# Patient Record
Sex: Female | Born: 1935 | Race: White | Hispanic: No | Smoking: Former smoker
Health system: Southern US, Community
[De-identification: ages and names within clinical notes are randomized; demographics above are authoritative.]

## PROBLEM LIST (undated history)

## (undated) DIAGNOSIS — K5790 Diverticulosis of intestine, part unspecified, without perforation or abscess without bleeding: Secondary | ICD-10-CM

## (undated) DIAGNOSIS — K5900 Constipation, unspecified: Secondary | ICD-10-CM

## (undated) DIAGNOSIS — N951 Menopausal and female climacteric states: Secondary | ICD-10-CM

## (undated) DIAGNOSIS — E559 Vitamin D deficiency, unspecified: Secondary | ICD-10-CM

## (undated) DIAGNOSIS — K639 Disease of intestine, unspecified: Secondary | ICD-10-CM

## (undated) DIAGNOSIS — E7800 Pure hypercholesterolemia, unspecified: Secondary | ICD-10-CM

## (undated) DIAGNOSIS — D250 Submucous leiomyoma of uterus: Secondary | ICD-10-CM

## (undated) DIAGNOSIS — M21619 Bunion of unspecified foot: Secondary | ICD-10-CM

## (undated) DIAGNOSIS — I8390 Asymptomatic varicose veins of unspecified lower extremity: Secondary | ICD-10-CM

## (undated) DIAGNOSIS — C801 Malignant (primary) neoplasm, unspecified: Secondary | ICD-10-CM

## (undated) DIAGNOSIS — I639 Cerebral infarction, unspecified: Secondary | ICD-10-CM

## (undated) DIAGNOSIS — K219 Gastro-esophageal reflux disease without esophagitis: Secondary | ICD-10-CM

## (undated) DIAGNOSIS — M1990 Unspecified osteoarthritis, unspecified site: Secondary | ICD-10-CM

## (undated) DIAGNOSIS — R748 Abnormal levels of other serum enzymes: Secondary | ICD-10-CM

## (undated) DIAGNOSIS — N952 Postmenopausal atrophic vaginitis: Secondary | ICD-10-CM

## (undated) HISTORY — PX: UPPER GI ENDOSCOPY: SHX6162

## (undated) HISTORY — DX: Bunion of unspecified foot: M21.619

## (undated) HISTORY — DX: Postmenopausal atrophic vaginitis: N95.2

## (undated) HISTORY — DX: Gastro-esophageal reflux disease without esophagitis: K21.9

## (undated) HISTORY — DX: Disease of intestine, unspecified: K63.9

## (undated) HISTORY — DX: Vitamin D deficiency, unspecified: E55.9

## (undated) HISTORY — PX: BUNIONECTOMY: SHX129

## (undated) HISTORY — DX: Pure hypercholesterolemia, unspecified: E78.00

## (undated) HISTORY — DX: Submucous leiomyoma of uterus: D25.0

## (undated) HISTORY — DX: Menopausal and female climacteric states: N95.1

## (undated) HISTORY — DX: Asymptomatic varicose veins of unspecified lower extremity: I83.90

## (undated) HISTORY — PX: CATARACT EXTRACTION: SUR2

## (undated) HISTORY — DX: Cerebral infarction, unspecified: I63.9

## (undated) HISTORY — DX: Diverticulosis of intestine, part unspecified, without perforation or abscess without bleeding: K57.90

## (undated) HISTORY — PX: WRIST FRACTURE SURGERY: SHX121

## (undated) HISTORY — PX: COLONOSCOPY W/ POLYPECTOMY: SHX1380

## (undated) HISTORY — DX: Abnormal levels of other serum enzymes: R74.8

## (undated) HISTORY — DX: Constipation, unspecified: K59.00

---

## 2001-01-04 ENCOUNTER — Other Ambulatory Visit: Admission: RE | Admit: 2001-01-04 | Discharge: 2001-01-04 | Payer: Self-pay | Admitting: Family Medicine

## 2004-04-02 ENCOUNTER — Ambulatory Visit: Payer: Self-pay | Admitting: Family Medicine

## 2004-05-05 ENCOUNTER — Ambulatory Visit: Payer: Self-pay | Admitting: Podiatry

## 2005-02-26 ENCOUNTER — Ambulatory Visit: Payer: Self-pay | Admitting: Podiatry

## 2005-04-05 ENCOUNTER — Ambulatory Visit: Payer: Self-pay | Admitting: Family Medicine

## 2005-04-19 ENCOUNTER — Ambulatory Visit: Payer: Self-pay | Admitting: Ophthalmology

## 2005-06-21 ENCOUNTER — Ambulatory Visit: Payer: Self-pay | Admitting: Ophthalmology

## 2006-04-11 ENCOUNTER — Ambulatory Visit: Payer: Self-pay | Admitting: Family Medicine

## 2006-05-31 ENCOUNTER — Emergency Department: Payer: Self-pay | Admitting: Emergency Medicine

## 2006-06-01 ENCOUNTER — Ambulatory Visit: Payer: Self-pay | Admitting: Specialist

## 2006-06-03 ENCOUNTER — Ambulatory Visit: Payer: Self-pay | Admitting: Specialist

## 2006-08-23 ENCOUNTER — Encounter: Payer: Self-pay | Admitting: Specialist

## 2006-09-02 ENCOUNTER — Encounter: Payer: Self-pay | Admitting: Specialist

## 2006-10-03 ENCOUNTER — Encounter: Payer: Self-pay | Admitting: Specialist

## 2006-11-02 ENCOUNTER — Encounter: Payer: Self-pay | Admitting: Specialist

## 2006-12-03 ENCOUNTER — Encounter: Payer: Self-pay | Admitting: Specialist

## 2007-01-02 ENCOUNTER — Encounter: Payer: Self-pay | Admitting: Specialist

## 2007-02-28 ENCOUNTER — Ambulatory Visit: Payer: Self-pay | Admitting: General Surgery

## 2007-03-15 ENCOUNTER — Ambulatory Visit: Payer: Self-pay | Admitting: Specialist

## 2007-04-12 ENCOUNTER — Ambulatory Visit: Payer: Self-pay | Admitting: Family Medicine

## 2008-02-16 DIAGNOSIS — K5790 Diverticulosis of intestine, part unspecified, without perforation or abscess without bleeding: Secondary | ICD-10-CM | POA: Insufficient documentation

## 2008-02-16 DIAGNOSIS — K639 Disease of intestine, unspecified: Secondary | ICD-10-CM

## 2008-02-16 HISTORY — DX: Disease of intestine, unspecified: K63.9

## 2008-02-16 HISTORY — DX: Diverticulosis of intestine, part unspecified, without perforation or abscess without bleeding: K57.90

## 2008-02-26 DIAGNOSIS — K219 Gastro-esophageal reflux disease without esophagitis: Secondary | ICD-10-CM | POA: Insufficient documentation

## 2008-02-26 HISTORY — DX: Gastro-esophageal reflux disease without esophagitis: K21.9

## 2008-04-11 ENCOUNTER — Ambulatory Visit: Payer: Self-pay | Admitting: Gastroenterology

## 2008-04-15 ENCOUNTER — Ambulatory Visit: Payer: Self-pay | Admitting: Family Medicine

## 2009-02-01 HISTORY — PX: HYSTEROSCOPY: SHX211

## 2009-04-16 ENCOUNTER — Ambulatory Visit: Payer: Self-pay | Admitting: Family Medicine

## 2009-04-21 ENCOUNTER — Ambulatory Visit: Payer: Self-pay | Admitting: Family Medicine

## 2009-07-14 ENCOUNTER — Ambulatory Visit: Payer: Self-pay | Admitting: Unknown Physician Specialty

## 2009-07-29 ENCOUNTER — Ambulatory Visit: Payer: Self-pay | Admitting: Unknown Physician Specialty

## 2009-10-21 ENCOUNTER — Ambulatory Visit: Payer: Self-pay | Admitting: Rheumatology

## 2010-05-06 ENCOUNTER — Ambulatory Visit: Payer: Self-pay | Admitting: Family Medicine

## 2011-05-26 ENCOUNTER — Ambulatory Visit: Payer: Self-pay | Admitting: Family Medicine

## 2011-11-10 ENCOUNTER — Ambulatory Visit: Payer: Self-pay | Admitting: Rheumatology

## 2012-05-31 ENCOUNTER — Ambulatory Visit: Payer: Self-pay | Admitting: Family Medicine

## 2012-09-27 ENCOUNTER — Ambulatory Visit: Payer: Self-pay | Admitting: Gastroenterology

## 2012-09-28 LAB — PATHOLOGY REPORT

## 2013-07-17 ENCOUNTER — Ambulatory Visit: Payer: Self-pay | Admitting: Family Medicine

## 2013-08-02 ENCOUNTER — Ambulatory Visit: Payer: Self-pay | Admitting: Gastroenterology

## 2013-08-27 ENCOUNTER — Ambulatory Visit: Payer: Self-pay | Admitting: Family Medicine

## 2014-07-02 DIAGNOSIS — Z79899 Other long term (current) drug therapy: Secondary | ICD-10-CM | POA: Diagnosis not present

## 2014-07-02 DIAGNOSIS — F1099 Alcohol use, unspecified with unspecified alcohol-induced disorder: Secondary | ICD-10-CM | POA: Diagnosis not present

## 2014-07-02 DIAGNOSIS — K219 Gastro-esophageal reflux disease without esophagitis: Secondary | ICD-10-CM | POA: Diagnosis not present

## 2014-07-02 DIAGNOSIS — Z124 Encounter for screening for malignant neoplasm of cervix: Secondary | ICD-10-CM | POA: Diagnosis not present

## 2014-07-02 DIAGNOSIS — E780 Pure hypercholesterolemia: Secondary | ICD-10-CM | POA: Diagnosis not present

## 2014-07-02 DIAGNOSIS — M810 Age-related osteoporosis without current pathological fracture: Secondary | ICD-10-CM | POA: Diagnosis not present

## 2014-07-03 ENCOUNTER — Other Ambulatory Visit: Payer: Self-pay | Admitting: Family Medicine

## 2014-07-03 DIAGNOSIS — Z1231 Encounter for screening mammogram for malignant neoplasm of breast: Secondary | ICD-10-CM

## 2014-07-05 ENCOUNTER — Telehealth: Payer: Self-pay | Admitting: Family Medicine

## 2014-07-05 NOTE — Telephone Encounter (Signed)
Per note from [employer]: PAP normal.  Please notify patient of results. Thanks- Dr. Jerilynn Mages. Informed pt. Renaldo Fiddler, CMA

## 2014-07-05 NOTE — Telephone Encounter (Signed)
Pt is called to get lab results.  HM#[phone]/MO

## 2014-08-06 ENCOUNTER — Encounter: Payer: Self-pay | Admitting: Family Medicine

## 2014-08-06 DIAGNOSIS — Z789 Other specified health status: Secondary | ICD-10-CM | POA: Insufficient documentation

## 2014-08-06 DIAGNOSIS — Z7289 Other problems related to lifestyle: Secondary | ICD-10-CM | POA: Insufficient documentation

## 2014-08-06 DIAGNOSIS — Z79899 Other long term (current) drug therapy: Secondary | ICD-10-CM | POA: Insufficient documentation

## 2014-08-12 ENCOUNTER — Encounter: Payer: Self-pay | Admitting: Family Medicine

## 2014-08-12 ENCOUNTER — Other Ambulatory Visit: Payer: Self-pay | Admitting: Family Medicine

## 2014-08-12 ENCOUNTER — Other Ambulatory Visit: Payer: Self-pay

## 2014-08-12 ENCOUNTER — Ambulatory Visit (INDEPENDENT_AMBULATORY_CARE_PROVIDER_SITE_OTHER): Payer: Medicare PPO | Admitting: Family Medicine

## 2014-08-12 VITALS — BP 130/76 | HR 77 | Temp 97.8°F | Resp 16 | Wt 134.0 lb

## 2014-08-12 DIAGNOSIS — N952 Postmenopausal atrophic vaginitis: Secondary | ICD-10-CM | POA: Insufficient documentation

## 2014-08-12 DIAGNOSIS — T7840XA Allergy, unspecified, initial encounter: Secondary | ICD-10-CM | POA: Insufficient documentation

## 2014-08-12 DIAGNOSIS — R748 Abnormal levels of other serum enzymes: Secondary | ICD-10-CM | POA: Diagnosis not present

## 2014-08-12 DIAGNOSIS — IMO0002 Reserved for concepts with insufficient information to code with codable children: Secondary | ICD-10-CM | POA: Insufficient documentation

## 2014-08-12 DIAGNOSIS — R0600 Dyspnea, unspecified: Secondary | ICD-10-CM

## 2014-08-12 DIAGNOSIS — D250 Submucous leiomyoma of uterus: Secondary | ICD-10-CM

## 2014-08-12 DIAGNOSIS — R002 Palpitations: Secondary | ICD-10-CM

## 2014-08-12 DIAGNOSIS — E7800 Pure hypercholesterolemia, unspecified: Secondary | ICD-10-CM | POA: Insufficient documentation

## 2014-08-12 DIAGNOSIS — R600 Localized edema: Secondary | ICD-10-CM | POA: Insufficient documentation

## 2014-08-12 DIAGNOSIS — E559 Vitamin D deficiency, unspecified: Secondary | ICD-10-CM

## 2014-08-12 DIAGNOSIS — I8390 Asymptomatic varicose veins of unspecified lower extremity: Secondary | ICD-10-CM

## 2014-08-12 DIAGNOSIS — M21619 Bunion of unspecified foot: Secondary | ICD-10-CM

## 2014-08-12 DIAGNOSIS — H269 Unspecified cataract: Secondary | ICD-10-CM | POA: Insufficient documentation

## 2014-08-12 DIAGNOSIS — K5900 Constipation, unspecified: Secondary | ICD-10-CM

## 2014-08-12 DIAGNOSIS — N951 Menopausal and female climacteric states: Secondary | ICD-10-CM

## 2014-08-12 DIAGNOSIS — H409 Unspecified glaucoma: Secondary | ICD-10-CM | POA: Insufficient documentation

## 2014-08-12 HISTORY — DX: Pure hypercholesterolemia, unspecified: E78.00

## 2014-08-12 HISTORY — DX: Bunion of unspecified foot: M21.619

## 2014-08-12 HISTORY — DX: Constipation, unspecified: K59.00

## 2014-08-12 HISTORY — DX: Submucous leiomyoma of uterus: D25.0

## 2014-08-12 HISTORY — DX: Asymptomatic varicose veins of unspecified lower extremity: I83.90

## 2014-08-12 HISTORY — DX: Abnormal levels of other serum enzymes: R74.8

## 2014-08-12 HISTORY — DX: Postmenopausal atrophic vaginitis: N95.2

## 2014-08-12 HISTORY — DX: Vitamin D deficiency, unspecified: E55.9

## 2014-08-12 HISTORY — DX: Menopausal and female climacteric states: N95.1

## 2014-08-12 NOTE — Progress Notes (Signed)
Subjective:    Patient ID: Courtney Willis, female    DOB: 09/26/1934, 80 y.o.   MRN: [id number] Chief Complaint  Patient presents with  . Irregular Heart Beat    since Christmas time, gradually worsening and more frequent  . Shortness of Breath    HPI This 80 year old female notice a constant heaviness in chest and intermittent shortness of breath and irregular heartbeat gradually since Christmas. Worse in the heat and feel better in A/C. No association with activities including yoga twice a week. No problem with climbing stairs or walking any distances. History of atrial fibrillation in maternal aunts and uncles. Maternal grandfather died from an MI at age 45. Denies asthma/wheezing or cough recently. Symptoms recognized more in the past few weeks. Has chronic reflux esophagitis with a small hiatal hernia per upper endoscopy by Dr. Allen Norris 08-02-13 and was switched from Omeprazole to [place] at that time. No hematemesis or melena. Normal sinus rhythm on EKG 06-15-13 and no sign of ischemic heart disease. Normal treadmill stress test with EF of 70% by Dr. Ubaldo Glassing 07-29-09. History reviewed. No pertinent past medical history. Patient Active Problem List   Diagnosis Date Noted  . Elevated liver enzymes 08/12/2014  . Bunion 08/12/2014  . Cataract 08/12/2014  . CN (constipation) 08/12/2014  . Edema of foot 08/12/2014  . Fibroids, submucosal 08/12/2014  . Glaucoma 08/12/2014  . Hypercholesteremia 08/12/2014  . Allergic state 08/12/2014  . Coitalgia 08/12/2014  . 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 Avitaminosis D 08/12/2014  . Post menopausal syndrome 08/12/2014  . Use of proton pump inhibitor therapy 08/06/2014  . Alcohol use 08/06/2014  . Acid reflux 02/26/2008  . DD (diverticular disease) 02/16/2008  . Bowel disease 02/16/2008   History  Substance Use Topics  . Smoking status: Former Research scientist (life sciences)  . Smokeless tobacco: Not on file  . Alcohol Use: 0.0 oz/week    0 Standard  drinks or equivalent per week     Comment: OCCASIONALLY DRINKS WINE   Past Surgical History  Procedure Laterality Date  . Bunionectomy      05/2004, 2007  . Hysteroscopy  2011  . Wrist fracture surgery Left     06/2006  . Cataract extraction      04/2005, 06/2005   Family History  Problem Relation Age of Onset  . Rheum arthritis Mother   . Lung cancer Father   . Ulcers Father   . Hodgkin's lymphoma Sister   . AVM Daughter    Current Outpatient Prescriptions  Medication Sig Dispense Refill  . Cholecalciferol (VITAMIN D3) 1000 UNITS CAPS Take 1 capsule by mouth daily.    Marland Kitchen DEXILANT 60 MG capsule Take 1 capsule by mouth daily.  11  . fluticasone (FLONASE) 50 MCG/ACT nasal spray Place 2 sprays into the nose daily.    . polyethylene glycol powder (GLYCOLAX/MIRALAX) powder Take by mouth.    . polyvinyl alcohol-povidone (REFRESH) 1.4-0.6 % ophthalmic solution Apply to eye.    . timolol (TIMOPTIC) 0.5 % ophthalmic solution Place 1 drop into both eyes at bedtime.  5  . ascorbic acid (VITAMIN C) 500 MG tablet Take 1 tablet by mouth daily.     No current facility-administered medications for this visit.   No Known Allergies   Review of Systems  Constitutional: Negative.   HENT: Negative.   Respiratory: Negative for cough and wheezing.        Occasional shortness of breath - not related to physical activities.  Cardiovascular:       Mild chest heaviness and occasional palpitations.  Gastrointestinal: Negative for nausea, vomiting, abdominal pain, diarrhea and blood in stool.       History of reflux esophagitis with hiatal hernia.  Neurological: Negative.        BP 130/76 mmHg  Pulse 77  Temp(Src) 97.8 F (36.6 C) (Oral)  Resp 16  Wt 134 lb (60.782 kg)  SpO2 98%  Objective:   Physical Exam  Constitutional: She is oriented to person, place, and time. She appears well-developed and well-nourished. No distress.  HENT:  Head: Normocephalic and atraumatic.  Right Ear:  Hearing and external ear normal.  Left Ear: Hearing and external ear normal.  Nose: Nose normal.  Mouth/Throat: Oropharynx is clear and moist.  Eyes: Conjunctivae, EOM and lids are normal. Pupils are equal, round, and reactive to light. Right eye exhibits no discharge. Left eye exhibits no discharge. No scleral icterus.  Neck: Normal range of motion. Neck supple.  Cardiovascular: Normal rate, regular rhythm, normal heart sounds and intact distal pulses.   No significant edema. History of right saphenous vein laser ablation by Dr. Delana Meyer. Still has some superficial varicose veins both legs. Normal 2+ pulses throughout. No carotid or abdominal bruits.  Pulmonary/Chest: Effort normal. No respiratory distress.  Abdominal: Soft. Bowel sounds are normal.  Musculoskeletal: Normal range of motion.  Neurological: She is alert and oriented to person, place, and time.  Skin: Skin is warm, dry and intact. No lesion and no rash noted.  Psychiatric: She has a normal mood and affect. Her speech is normal and behavior is normal. Thought content normal.      Assessment & Plan:  1. Palpitations Intermittent palpitations with some chest heaviness. Has a history of hiatal hernia. Normal EKG today. May need referral back to her cardiologist (Dr. Ubaldo Glassing) if recurrent. No diaphoresis or relationship to activities (including yoga classes twice a week). - EKG 12-Lead  2. Dyspnea Mild and not related to physical stress. Pulse oximetry 98% today. No cough or wheeze. Suspect relation to hiatal hernia and chronic GERD. Continue Dexilant as prescribed by gastroenterologist. Given reflux precautions. - EKG 12-Lead  3. Elevated liver enzymes Elevation of ALT to 51 and AST to 48 at CPE on 07-02-14. Picked up lab requisition to recheck levels since she limited the wine intake and ASAP use. If still elevated, will need recheck by Dr. Allen Norris (gastroenterologist).

## 2014-08-13 DIAGNOSIS — R748 Abnormal levels of other serum enzymes: Secondary | ICD-10-CM | POA: Diagnosis not present

## 2014-08-14 ENCOUNTER — Telehealth: Payer: Self-pay

## 2014-08-14 LAB — COMPREHENSIVE METABOLIC PANEL
A/G RATIO: 2.2 (ref 1.1–2.5)
ALBUMIN: 4.2 g/dL (ref 3.5–4.8)
ALK PHOS: 64 IU/L (ref 39–117)
ALT: 49 IU/L — ABNORMAL HIGH (ref 0–32)
AST: 39 IU/L (ref 0–40)
BILIRUBIN TOTAL: 0.6 mg/dL (ref 0.0–1.2)
BUN / CREAT RATIO: 15 (ref 11–26)
BUN: 14 mg/dL (ref 8–27)
CO2: 27 mmol/L (ref 18–29)
Calcium: 9.6 mg/dL (ref 8.7–10.3)
Chloride: 100 mmol/L (ref 97–108)
Creatinine, Ser: 0.92 mg/dL (ref 0.57–1.00)
GFR calc Af Amer: 69 mL/min/{1.73_m2} (ref >59)
GFR, EST NON AFRICAN AMERICAN: 60 mL/min/{1.73_m2} (ref >59)
GLUCOSE: 89 mg/dL (ref 65–99)
Globulin, Total: 1.9 g/dL (ref 1.5–4.5)
Potassium: 4.9 mmol/L (ref 3.5–5.2)
Sodium: 140 mmol/L (ref 134–144)
Total Protein: 6.1 g/dL (ref 6.0–8.5)

## 2014-08-14 NOTE — Telephone Encounter (Signed)
LMTCB 08/14/2014  Thanks,   -Mickel Baas

## 2014-08-14 NOTE — Telephone Encounter (Signed)
-----   Message from Margarita Rana, MD sent at 08/14/2014 12:29 PM EDT ----- Liver enzyme still mildly improved. Recommend recheck in  6 weeks. Thanks.

## 2014-08-15 ENCOUNTER — Telehealth: Payer: Self-pay | Admitting: Gastroenterology

## 2014-08-15 ENCOUNTER — Other Ambulatory Visit: Payer: Self-pay

## 2014-08-15 DIAGNOSIS — K2100 Gastro-esophageal reflux disease with esophagitis, without bleeding: Secondary | ICD-10-CM

## 2014-08-15 MED ORDER — PANTOPRAZOLE SODIUM 40 MG PO TBEC: 40.0000 mg | DELAYED_RELEASE_TABLET | Freq: Every day | ORAL | Status: DC

## 2014-08-15 NOTE — Telephone Encounter (Signed)
Advised pt of lab results. Pt verbally acknowledges understanding. Wanya Bangura Drozdowski, CMA   

## 2014-08-15 NOTE — Telephone Encounter (Signed)
Pt would like to discuss Dexilant with you. Maybe something will help a little more. Home [phone] Cell [phone]

## 2014-08-15 NOTE — Telephone Encounter (Signed)
Please ok for me to switch pt to Pantoprazole for 30 days. Advised her if this doesn't work then we will need to schedule a follow up to discuss symptoms.

## 2014-08-20 ENCOUNTER — Other Ambulatory Visit: Payer: Self-pay

## 2014-08-27 NOTE — Telephone Encounter (Signed)
ok 

## 2014-09-03 ENCOUNTER — Other Ambulatory Visit: Payer: Self-pay | Admitting: Family Medicine

## 2014-09-03 ENCOUNTER — Ambulatory Visit
Admission: RE | Admit: 2014-09-03 | Discharge: 2014-09-03 | Disposition: A | Discharge status: Home / Self Care | Payer: Medicare PPO | Source: Ambulatory Visit | Attending: Family Medicine | Admitting: Family Medicine

## 2014-09-03 DIAGNOSIS — Z1231 Encounter for screening mammogram for malignant neoplasm of breast: Secondary | ICD-10-CM | POA: Insufficient documentation

## 2014-09-10 ENCOUNTER — Telehealth: Payer: Self-pay | Admitting: Gastroenterology

## 2014-09-10 NOTE — Telephone Encounter (Signed)
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 Please call her once called in

## 2014-09-11 ENCOUNTER — Telehealth: Payer: Self-pay | Admitting: Gastroenterology

## 2014-09-11 ENCOUNTER — Other Ambulatory Visit: Payer: Self-pay | Admitting: Gastroenterology

## 2014-09-11 DIAGNOSIS — K2100 Gastro-esophageal reflux disease with esophagitis, without bleeding: Secondary | ICD-10-CM

## 2014-09-11 MED ORDER — PANTOPRAZOLE SODIUM 40 MG PO TBEC: 40.0000 mg | DELAYED_RELEASE_TABLET | Freq: Every day | ORAL | Status: DC

## 2014-09-11 NOTE — Telephone Encounter (Signed)
Refill asap for generic Pro[facility] in [place]. She doesn't want to go a day without it and would like to speak to you today.

## 2014-09-11 NOTE — Telephone Encounter (Signed)
Rx refill sent to pt's pharmacy per her request.

## 2014-09-30 ENCOUNTER — Telehealth: Payer: Self-pay

## 2014-09-30 DIAGNOSIS — R748 Abnormal levels of other serum enzymes: Secondary | ICD-10-CM

## 2014-09-30 NOTE — Telephone Encounter (Signed)
Patient requesting lab order to recheck elevated liver enzymes.

## 2014-09-30 NOTE — Telephone Encounter (Signed)
Printed. Please call patient. Thanks.

## 2014-10-01 DIAGNOSIS — Z85828 Personal history of other malignant neoplasm of skin: Secondary | ICD-10-CM | POA: Diagnosis not present

## 2014-10-01 DIAGNOSIS — R748 Abnormal levels of other serum enzymes: Secondary | ICD-10-CM | POA: Diagnosis not present

## 2014-10-01 DIAGNOSIS — D225 Melanocytic nevi of trunk: Secondary | ICD-10-CM | POA: Diagnosis not present

## 2014-10-01 DIAGNOSIS — D2271 Melanocytic nevi of right lower limb, including hip: Secondary | ICD-10-CM | POA: Diagnosis not present

## 2014-10-01 DIAGNOSIS — L989 Disorder of the skin and subcutaneous tissue, unspecified: Secondary | ICD-10-CM | POA: Diagnosis not present

## 2014-10-01 DIAGNOSIS — D485 Neoplasm of uncertain behavior of skin: Secondary | ICD-10-CM | POA: Diagnosis not present

## 2014-10-01 DIAGNOSIS — D2262 Melanocytic nevi of left upper limb, including shoulder: Secondary | ICD-10-CM | POA: Diagnosis not present

## 2014-10-02 ENCOUNTER — Telehealth: Payer: Self-pay

## 2014-10-02 LAB — COMPREHENSIVE METABOLIC PANEL
ALK PHOS: 59 IU/L (ref 39–117)
ALT: 14 IU/L (ref 0–32)
AST: 20 IU/L (ref 0–40)
Albumin/Globulin Ratio: 1.8 (ref 1.1–2.5)
Albumin: 4 g/dL (ref 3.5–4.8)
BILIRUBIN TOTAL: 0.8 mg/dL (ref 0.0–1.2)
BUN/Creatinine Ratio: 14 (ref 11–26)
BUN: 11 mg/dL (ref 8–27)
CHLORIDE: 102 mmol/L (ref 97–108)
CO2: 25 mmol/L (ref 18–29)
CREATININE: 0.8 mg/dL (ref 0.57–1.00)
Calcium: 9.5 mg/dL (ref 8.7–10.3)
GFR calc Af Amer: 82 mL/min/{1.73_m2} (ref >59)
GFR calc non Af Amer: 71 mL/min/{1.73_m2} (ref >59)
GLOBULIN, TOTAL: 2.2 g/dL (ref 1.5–4.5)
GLUCOSE: 95 mg/dL (ref 65–99)
Potassium: 4.6 mmol/L (ref 3.5–5.2)
SODIUM: 142 mmol/L (ref 134–144)
Total Protein: 6.2 g/dL (ref 6.0–8.5)

## 2014-10-02 NOTE — Telephone Encounter (Signed)
Advised pt of lab results. Pt verbally acknowledges understanding. Pt very happy with news and wanted to let provider know that she believes it was Dexilant that may have cause her LFT's to be elevated. Also states she has done OTC Milk Thistle and an "all natural liver cleanse". Renaldo Fiddler, CMA

## 2014-10-02 NOTE — Telephone Encounter (Signed)
-----   Message from Margarita Rana, MD sent at 10/02/2014 10:10 AM EDT ----- Labs normal. Please notify patient. Thanks.

## 2014-10-15 ENCOUNTER — Ambulatory Visit (INDEPENDENT_AMBULATORY_CARE_PROVIDER_SITE_OTHER): Payer: Medicare PPO | Admitting: Gastroenterology

## 2014-10-15 VITALS — BP 178/85 | HR 80 | Temp 98.7°F | Ht 67.0 in | Wt 133.4 lb

## 2014-10-15 DIAGNOSIS — K219 Gastro-esophageal reflux disease without esophagitis: Secondary | ICD-10-CM | POA: Diagnosis not present

## 2014-10-15 NOTE — Progress Notes (Signed)
   Primary Care Physician: Margarita Rana, MD  Primary Gastroenterologist:  Dr. Lucilla Lame  Chief Complaint  Patient presents with  . Elevated Liver Enzymes    HPI: Courtney Willis is a 80 y.o. female here for follow-up and multiple questions about the medication she is taking. The patient comes with a folder full of articles reporting the dangers of proton pump inhibitors. The patient has questions about these medications. The patient had abnormal liver enzymes while she was taking Dexilant and the enzymes returned to normal when she stopped the medication. She's now on Protonix. The patient states that she is well-controlled on the Protonix as far as her chest pain and heartburn or concerned.  Current Outpatient Prescriptions  Medication Sig Dispense Refill  . ascorbic acid (VITAMIN C) 500 MG tablet Take 1 tablet by mouth daily.    . Cholecalciferol (VITAMIN D3) 1000 UNITS CAPS Take 1 capsule by mouth daily.    . fluticasone (FLONASE) 50 MCG/ACT nasal spray Place 2 sprays into the nose daily.    . pantoprazole (PROTONIX) 40 MG tablet Take 1 tablet (40 mg total) by mouth daily. 30 tablet 11  . polyethylene glycol powder (GLYCOLAX/MIRALAX) powder Take by mouth.    . polyvinyl alcohol-povidone (REFRESH) 1.4-0.6 % ophthalmic solution Apply to eye.    . timolol (TIMOPTIC) 0.5 % ophthalmic solution Place 1 drop into both eyes at bedtime.  5   No current facility-administered medications for this visit.    Allergies as of 10/15/2014  . (No Known Allergies)    ROS:  General: Negative for anorexia, weight loss, fever, chills, fatigue, weakness. ENT: Negative for hoarseness, difficulty swallowing , nasal congestion. CV: Negative for chest pain, angina, palpitations, dyspnea on exertion, peripheral edema.  Respiratory: Negative for dyspnea at rest, dyspnea on exertion, cough, sputum, wheezing.  GI: See history of present illness. GU:  Negative for dysuria, hematuria, urinary incontinence,  urinary frequency, nocturnal urination.  Endo: Negative for unusual weight change.    Physical Examination:   BP 178/85 mmHg  Pulse 80  Temp(Src) 98.7 F (37.1 C) (Oral)  Ht 5\' 7"  (1.702 m)  Wt 133 lb 6.4 oz (60.51 kg)  BMI 20.89 kg/m2  General: Well-nourished, well-developed in no acute distress.  Eyes: No icterus. Conjunctivae pink. Neuro: Alert and oriented x 3.  Grossly intact. Skin: Warm and dry, no jaundice.   Psych: Alert and cooperative, normal mood and affect.  Labs:    Imaging Studies: No results found.  Assessment and Plan:   Courtney Willis is a 80 y.o. y/o female who comes in with questions about her long-term use of Protonix. The patient was explain the risks including increased risk of renal failure, dementia, decreased magnesium, osteoporosis, C diff colitis and anemia. She was given the option to stop the medication knowing the risks and benefits but states that she does not want to have chest pain and symptoms of reflux that she was having off the medication. The patient has agreed to continue the medication and will contact me if she has any further symptoms.   I spent 30 minutes with the patient explaining the side effects of the PPI.   Note: This dictation was prepared with Dragon dictation along with smaller phrase technology. Any transcriptional errors that result from this process are unintentional.

## 2014-10-23 DIAGNOSIS — H40053 Ocular hypertension, bilateral: Secondary | ICD-10-CM | POA: Diagnosis not present

## 2015-07-08 ENCOUNTER — Encounter: Payer: Self-pay | Admitting: Family Medicine

## 2015-07-08 ENCOUNTER — Ambulatory Visit (INDEPENDENT_AMBULATORY_CARE_PROVIDER_SITE_OTHER): Payer: Medicare Other | Admitting: Family Medicine

## 2015-07-08 VITALS — BP 150/76 | HR 80 | Temp 98.3°F | Resp 16 | Ht 66.5 in | Wt 136.0 lb

## 2015-07-08 DIAGNOSIS — Z0000 Encounter for general adult medical examination without abnormal findings: Secondary | ICD-10-CM

## 2015-07-08 DIAGNOSIS — R600 Localized edema: Secondary | ICD-10-CM

## 2015-07-08 DIAGNOSIS — E7800 Pure hypercholesterolemia, unspecified: Secondary | ICD-10-CM

## 2015-07-08 LAB — POCT URINALYSIS DIPSTICK
BILIRUBIN UA: NEGATIVE
GLUCOSE UA: NEGATIVE
KETONES UA: NEGATIVE
LEUKOCYTES UA: NEGATIVE
NITRITE UA: NEGATIVE
PH UA: 6.5
Protein, UA: NEGATIVE
RBC UA: NEGATIVE
Spec Grav, UA: <=1.005
Urobilinogen, UA: 0.2

## 2015-07-08 NOTE — Progress Notes (Signed)
Patient ID: YULY ROHLFING, female   DOB: [birth year], 81 y.o.   MRN: [id number]       Patient: SUE-ANNE VANKLEY, Female    DOB: 10-15-1934, 81 y.o.   MRN: [id number] Visit Date: 07/08/2015  Today's Provider: Margarita Rana, MD   Chief Complaint  Patient presents with  . Medicare Wellness   Subjective:    Annual wellness visit JAE GAMBY is a 81 y.o. female. She feels well. She reports exercising 2 days a week. She reports she is sleeping well.  07/02/14 AWE 07/02/14 Pap-neg 09/03/14 Mammogram-BI-RADS 1 07/17/13 BMD-osteoporosis, F/B Dr. Jefm Bryant -----------------------------------------------------------  Foot problem: Patient c/o right feet swelling and discoloration for over a year. Patient reports that swelling is worse in the evening. Patient denies pain or worsening symptoms.   Review of Systems  Constitutional: Negative.   HENT: Negative.   Eyes: Negative.   Respiratory: Negative.   Cardiovascular: Negative.   Gastrointestinal: Negative.   Endocrine: Negative.   Genitourinary: Negative.   Musculoskeletal: Negative.   Skin: Negative.   Allergic/Immunologic: Positive for environmental allergies.  Neurological: Negative.   Hematological: Bruises/bleeds easily.  Psychiatric/Behavioral: Negative.     Social History   Social History  . Marital Status: Divorced    Spouse Name: N/A  . Number of Children: N/A  . Years of Education: N/A   Occupational History  . Not on file.   Social History Main Topics  . Smoking status: Former Research scientist (life sciences)  . Smokeless tobacco: Never Used  . Alcohol Use: 1.8 oz/week    3 Glasses of wine, 0 Standard drinks or equivalent per week  . Drug Use: No  . Sexual Activity: Not on file   Other Topics Concern  . Not on file   Social History Narrative    Past Medical History  Diagnosis Date  . 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 CN (constipation) 08/12/2014  . DD (diverticular disease) 02/16/2008  . Acid reflux 02/26/2008  . Bowel disease 02/16/2008  .  Bunion 08/12/2014  . Fibroids, submucosal 08/12/2014    of her lower lip   . 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 Elevated liver enzymes 08/12/2014  . Hypercholesteremia 08/12/2014  . Avitaminosis D 08/12/2014  . Post menopausal syndrome 08/12/2014     Patient Active Problem List   Diagnosis Date Noted  . Elevated liver enzymes 08/12/2014  . Bunion 08/12/2014  . Cataract 08/12/2014  . CN (constipation) 08/12/2014  . Edema of foot 08/12/2014  . Fibroids, submucosal 08/12/2014  . Glaucoma 08/12/2014  . Hypercholesteremia 08/12/2014  . Allergic state 08/12/2014  . Coitalgia 08/12/2014  . 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 Avitaminosis D 08/12/2014  . Post menopausal syndrome 08/12/2014  . Use of proton pump inhibitor therapy 08/06/2014  . Alcohol use ([place]) 08/06/2014  . Acid reflux 02/26/2008  . DD (diverticular disease) 02/16/2008  . Bowel disease 02/16/2008    Past Surgical History  Procedure Laterality Date  . Bunionectomy      05/2004, 2007  . Hysteroscopy  2011  . Wrist fracture surgery Left     06/2006  . Cataract extraction      04/2005, 06/2005    Her family history includes AVM in her daughter; Hodgkin's lymphoma in her sister; Lung cancer in her father; Rheum arthritis in her mother; Ulcers in her father.    Previous Medications   ASCORBIC ACID (VITAMIN C) 500 MG TABLET    Take 1 tablet by mouth daily.   CHLORPHENIRAMINE (ALLERGY) 4  MG TABLET    Take 4 mg by mouth 2 (two) times daily as needed for allergies.   CHOLECALCIFEROL (VITAMIN D3) 1000 UNITS CAPS    Take 1 capsule by mouth daily.   FLUTICASONE (FLONASE) 50 MCG/ACT NASAL SPRAY    Place 2 sprays into the nose daily.   PANTOPRAZOLE (PROTONIX) 40 MG TABLET    Take 1 tablet (40 mg total) by mouth daily.   POLYETHYLENE GLYCOL POWDER (GLYCOLAX/MIRALAX) POWDER    Take by mouth.   POLYVINYL ALCOHOL-POVIDONE (REFRESH) 1.4-0.6 % OPHTHALMIC SOLUTION    Apply to eye.   TIMOLOL (TIMOPTIC) 0.5 %  OPHTHALMIC SOLUTION    Place 1 drop into both eyes at bedtime.    Patient Care Team: Margarita Rana, MD as PCP - General (Family Medicine)     Objective:   Vitals: BP 150/76 mmHg  Pulse 80  Temp(Src) 98.3 F (36.8 C) (Oral)  Resp 16  Ht 5' 6.5" (1.689 m)  Wt 136 lb (61.689 kg)  BMI 21.62 kg/m2  Physical Exam  Constitutional: She is oriented to person, place, and time. She appears well-developed and well-nourished.  HENT:  Head: Normocephalic and atraumatic.  Right Ear: Tympanic membrane, external ear and ear canal normal.  Left Ear: Tympanic membrane, external ear and ear canal normal.  Nose: Nose normal.  Mouth/Throat: Uvula is midline, oropharynx is clear and moist and mucous membranes are normal.  Eyes: Conjunctivae, EOM and lids are normal. Pupils are equal, round, and reactive to light.  Neck: Trachea normal and normal range of motion. Neck supple. Carotid bruit is not present. No thyroid mass and no thyromegaly present.  Cardiovascular: Normal rate, regular rhythm and normal heart sounds.   Pulmonary/Chest: Effort normal and breath sounds normal.  Abdominal: Soft. Normal appearance and bowel sounds are normal. There is no hepatosplenomegaly. There is no tenderness.  Genitourinary: No breast swelling, tenderness or discharge.  Musculoskeletal: Normal range of motion. She exhibits edema.  Blanchable and right foot swelling  Lymphadenopathy:    She has no cervical adenopathy.    She has no axillary adenopathy.  Neurological: She is alert and oriented to person, place, and time. She has normal strength. No cranial nerve deficit.  Skin: Skin is warm, dry and intact.  Psychiatric: She has a normal mood and affect. Her speech is normal and behavior is normal. Judgment and thought content normal. Cognition and memory are normal.    Activities of Daily Living In your present state of health, do you have any difficulty performing the following activities: 07/08/2015  Hearing? N    Vision? N  Difficulty concentrating or making decisions? N  Walking or climbing stairs? N  Dressing or bathing? N  Doing errands, shopping? N    Fall Risk Assessment Fall Risk  07/08/2015  Falls in the past year? Yes  Injury with Fall? No     Depression Screen PHQ 2/9 Scores 07/08/2015  PHQ - 2 Score 0    Cognitive Testing - 6-CIT  Correct? Score   What year is it? yes 0 0 or 4  What month is it? yes 0 0 or 3  Memorize:    Pia Mau,  44,  [place],      What time is it? (within 1 hour) yes 0 0 or 3  Count backwards from 20 yes 0 0, 2, or 4  Name the months of the year yes 0 0, 2, or 4  Repeat name & address above no 6  0, 2, 4, 6, 8, or 10       TOTAL SCORE  6/28   Interpretation:  Normal  Normal (0-7) Abnormal (8-28)       Assessment & Plan:     Annual Wellness Visit  Reviewed patient's Family Medical History Reviewed and updated list of patient's medical providers Assessment of cognitive impairment was done Assessed patient's functional ability Established a written schedule for health screening [place] Completed and Reviewed  Exercise Activities and Dietary recommendations Goals    None      Immunization History  Administered Date(s) Administered  . Pneumococcal Conjugate-13 07/02/2014  . Pneumococcal Polysaccharide-23 07/01/2004  . Td 01/14/2003, 04/08/2010  . Tdap 04/08/2010  . Zoster 03/11/2005       1. Medicare annual wellness visit, subsequent Stable. Patient advised to continue eating healthy and exercise daily. - POCT urinalysis dipstick  2. Hypercholesteremia F/U pending lab report. - CBC with Differential/Platelet - Comprehensive metabolic panel - Lipid Panel With LDL/HDL Ratio - TSH  3. Localized edema Recurrent. Patient referred to Dr. Delana Meyer.  - Ambulatory referral to Vascular Surgery     Patient seen and examined by Dr. Jerrell Belfast, and note scribed by Philbert Riser. Dimas, CMA.  I  have reviewed the document for accuracy and completeness and I agree with above. Jerrell Belfast, MD   Margarita Rana, MD   ------------------------------------------------------------------------------------------------------------

## 2015-07-10 ENCOUNTER — Telehealth: Payer: Self-pay

## 2015-07-10 LAB — COMPREHENSIVE METABOLIC PANEL
ALBUMIN: 4.1 g/dL (ref 3.5–4.8)
ALK PHOS: 59 IU/L (ref 39–117)
ALT: 13 IU/L (ref 0–32)
AST: 17 IU/L (ref 0–40)
Albumin/Globulin Ratio: 2.2 (ref 1.2–2.2)
BUN/Creatinine Ratio: 11 — ABNORMAL LOW (ref 12–28)
BUN: 10 mg/dL (ref 8–27)
Bilirubin Total: 0.6 mg/dL (ref 0.0–1.2)
CALCIUM: 9.7 mg/dL (ref 8.7–10.3)
CO2: 25 mmol/L (ref 18–29)
CREATININE: 0.88 mg/dL (ref 0.57–1.00)
Chloride: 100 mmol/L (ref 96–106)
GFR calc Af Amer: 72 mL/min/{1.73_m2} (ref >59)
GFR, EST NON AFRICAN AMERICAN: 63 mL/min/{1.73_m2} (ref >59)
GLOBULIN, TOTAL: 1.9 g/dL (ref 1.5–4.5)
GLUCOSE: 93 mg/dL (ref 65–99)
Potassium: 5.1 mmol/L (ref 3.5–5.2)
SODIUM: 139 mmol/L (ref 134–144)
Total Protein: 6 g/dL (ref 6.0–8.5)

## 2015-07-10 LAB — CBC WITH DIFFERENTIAL/PLATELET
BASOS ABS: 0 10*3/uL (ref 0.0–0.2)
Basos: 1 %
EOS (ABSOLUTE): 0.1 10*3/uL (ref 0.0–0.4)
EOS: 1 %
HEMATOCRIT: 36.3 % (ref 34.0–46.6)
HEMOGLOBIN: 12 g/dL (ref 11.1–15.9)
IMMATURE GRANULOCYTES: 0 %
Immature Grans (Abs): 0 10*3/uL (ref 0.0–0.1)
LYMPHS ABS: 1.2 10*3/uL (ref 0.7–3.1)
Lymphs: 31 %
MCH: 29.9 pg (ref 26.6–33.0)
MCHC: 33.1 g/dL (ref 31.5–35.7)
MCV: 91 fL (ref 79–97)
MONOCYTES: 9 %
Monocytes Absolute: 0.3 10*3/uL (ref 0.1–0.9)
NEUTROS PCT: 58 %
Neutrophils Absolute: 2.3 10*3/uL (ref 1.4–7.0)
Platelets: 289 10*3/uL (ref 150–379)
RBC: 4.01 x10E6/uL (ref 3.77–5.28)
RDW: 13.2 % (ref 12.3–15.4)
WBC: 3.9 10*3/uL (ref 3.4–10.8)

## 2015-07-10 LAB — LIPID PANEL WITH LDL/HDL RATIO
CHOLESTEROL TOTAL: 204 mg/dL — AB (ref 100–199)
HDL: 75 mg/dL (ref >39)
LDL CALC: 117 mg/dL — AB (ref 0–99)
LDL/HDL RATIO: 1.6 ratio (ref 0.0–3.2)
TRIGLYCERIDES: 61 mg/dL (ref 0–149)
VLDL CHOLESTEROL CAL: 12 mg/dL (ref 5–40)

## 2015-07-10 LAB — TSH: TSH: 5.67 u[IU]/mL — ABNORMAL HIGH (ref 0.450–4.500)

## 2015-07-10 NOTE — Telephone Encounter (Signed)
Tried calling; pt's voice is not set up.  07/10/2015  Thanks,   -Mickel Baas

## 2015-07-10 NOTE — Telephone Encounter (Signed)
-----   Message from Margarita Rana, MD sent at 07/10/2015  2:02 PM EDT ----- Labs stable. Thyroid slightly low but not enough to treat. Recheck labs annually. Thanks.

## 2015-07-16 NOTE — Telephone Encounter (Signed)
Patient advised as below.  

## 2015-07-28 ENCOUNTER — Other Ambulatory Visit: Payer: Self-pay | Admitting: Family Medicine

## 2015-07-28 DIAGNOSIS — Z1231 Encounter for screening mammogram for malignant neoplasm of breast: Secondary | ICD-10-CM

## 2015-07-31 ENCOUNTER — Other Ambulatory Visit: Payer: Self-pay | Admitting: Gastroenterology

## 2015-08-26 ENCOUNTER — Emergency Department
Admission: EM | Admit: 2015-08-26 | Discharge: 2015-08-26 | Disposition: A | Discharge status: Home / Self Care | Payer: Medicare Other | Attending: Emergency Medicine | Admitting: Emergency Medicine

## 2015-08-26 ENCOUNTER — Emergency Department: Payer: Medicare Other

## 2015-08-26 DIAGNOSIS — Y9389 Activity, other specified: Secondary | ICD-10-CM | POA: Insufficient documentation

## 2015-08-26 DIAGNOSIS — W010XXA Fall on same level from slipping, tripping and stumbling without subsequent striking against object, initial encounter: Secondary | ICD-10-CM | POA: Diagnosis not present

## 2015-08-26 DIAGNOSIS — Y999 Unspecified external cause status: Secondary | ICD-10-CM | POA: Diagnosis not present

## 2015-08-26 DIAGNOSIS — S52611A Displaced fracture of right ulna styloid process, initial encounter for closed fracture: Secondary | ICD-10-CM | POA: Insufficient documentation

## 2015-08-26 DIAGNOSIS — Z87891 Personal history of nicotine dependence: Secondary | ICD-10-CM | POA: Diagnosis not present

## 2015-08-26 DIAGNOSIS — S52501A Unspecified fracture of the lower end of right radius, initial encounter for closed fracture: Secondary | ICD-10-CM | POA: Diagnosis not present

## 2015-08-26 DIAGNOSIS — Z79899 Other long term (current) drug therapy: Secondary | ICD-10-CM | POA: Insufficient documentation

## 2015-08-26 DIAGNOSIS — S52201A Unspecified fracture of shaft of right ulna, initial encounter for closed fracture: Secondary | ICD-10-CM

## 2015-08-26 DIAGNOSIS — Z23 Encounter for immunization: Secondary | ICD-10-CM | POA: Diagnosis not present

## 2015-08-26 DIAGNOSIS — S59911A Unspecified injury of right forearm, initial encounter: Secondary | ICD-10-CM | POA: Diagnosis present

## 2015-08-26 DIAGNOSIS — Y929 Unspecified place or not applicable: Secondary | ICD-10-CM | POA: Diagnosis not present

## 2015-08-26 DIAGNOSIS — W19XXXA Unspecified fall, initial encounter: Secondary | ICD-10-CM

## 2015-08-26 MED ORDER — OXYCODONE-ACETAMINOPHEN 5-325 MG PO TABS: 1.0000 | ORAL_TABLET | ORAL | 0 refills | Status: DC | PRN

## 2015-08-26 MED ORDER — TETANUS-DIPHTH-ACELL PERTUSSIS 5-2.5-18.5 LF-MCG/0.5 IM SUSP
0.5000 mL | Freq: Once | INTRAMUSCULAR | Status: AC
  Administered 2015-08-26: 0.5 mL via INTRAMUSCULAR
  Filled 2015-08-26: qty 0.5

## 2015-08-26 MED ORDER — OXYCODONE-ACETAMINOPHEN 5-325 MG PO TABS
2.0000 | ORAL_TABLET | Freq: Once | ORAL | Status: AC
  Administered 2015-08-26: 2 via ORAL
  Filled 2015-08-26: qty 2

## 2015-08-26 NOTE — ED Triage Notes (Signed)
Pt comes into the ED via EMS with c/o tripping and falling injuring her right wrist.. Pt arrives with splint in place.Marland Kitchen

## 2015-08-26 NOTE — Discharge Instructions (Signed)
As we discussed, our orthopedic surgeon, Dr. Roland Rack, felt that he would be best treated by a specialist such as Dr. Amedeo Plenty in [place].  Please read through the included information and follow-up at the next available opportunity in Dr. Vanetta Shawl clinic.    Take Percocet as prescribed for severe pain. Do not drink alcohol, drive or participate in any other potentially dangerous activities while taking this medication as it may make you sleepy. Do not take this medication with any other sedating medications, either prescription or over-the-counter. If you were prescribed Percocet or Vicodin, do not take these with acetaminophen (Tylenol) as it is already contained within these medications.   This medication is an opiate (or narcotic) pain medication and can be habit forming.  Use it as little as possible to achieve adequate pain control.  Do not use or use it with extreme caution if you have a history of opiate abuse or dependence.  If you are on a pain contract with your primary care doctor or a pain specialist, be sure to let them know you were prescribed this medication today from the [facility] Emergency Department.  This medication is intended for your use only - do not give any to anyone else and keep it in a secure place where nobody else, especially children, have access to it.  It will also cause or worsen constipation, so you may want to consider taking an over-the-counter stool softener while you are taking this medication.  Return to the emergency department if you develop new or worsening symptoms that concern you.

## 2015-08-26 NOTE — ED Provider Notes (Signed)
[facility] Emergency Department Provider Note  ____________________________________________  Time seen: Approximately 8:22 PM  I have reviewed the triage vital signs and the nursing notes.   HISTORY  Chief Complaint Arm Injury    HPI Courtney Willis is a 81 y.o. female with no significant chronic medical issues who presents for evaluation of acute onset right wrist pain after a mechanical fall.  She reports that she stumbled while wearing flip-flops and landed on her right outstretched hand/wrist and felt immediate onset of severe sharp pain in the distal forearm.  The pain now is moderate at rest but severe with movement.  She has no numbness nor tingling in the hand or fingers.  She is able to move her fingers although it is uncomfortable to do so.  There is swelling and discoloration around her wrist.  She did not strike her head, did not lose consciousness, and denies both headache and neck pain.  She also denies chest pain, shortness of breath, abdominal pain, and any other extremity injury.  She did point out a small skin abrasion on the anterior right lower leg that occurred some time ago and she was concerned about it being a slow healing wound.She ambulates without difficulty.  She does not know the date of her last shot.   Past Medical History:  Diagnosis Date  . Acid reflux 02/26/2008  . 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 Avitaminosis D 08/12/2014  . Bowel disease 02/16/2008  . Bunion 08/12/2014  . CN (constipation) 08/12/2014  . DD (diverticular disease) 02/16/2008  . Elevated liver enzymes 08/12/2014  . Fibroids, submucosal 08/12/2014   of her lower lip   . Hypercholesteremia 08/12/2014  . 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 Post menopausal syndrome 08/12/2014    Patient Active Problem List   Diagnosis Date Noted  . Elevated liver enzymes 08/12/2014  . Bunion 08/12/2014  . Cataract 08/12/2014  . CN (constipation) 08/12/2014  . Edema of foot 08/12/2014  .  Fibroids, submucosal 08/12/2014  . Glaucoma 08/12/2014  . Hypercholesteremia 08/12/2014  . Allergic state 08/12/2014  . Coitalgia 08/12/2014  . 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 Avitaminosis D 08/12/2014  . Post menopausal syndrome 08/12/2014  . Use of proton pump inhibitor therapy 08/06/2014  . Alcohol use ([place]) 08/06/2014  . Acid reflux 02/26/2008  . DD (diverticular disease) 02/16/2008  . Bowel disease 02/16/2008    Past Surgical History:  Procedure Laterality Date  . BUNIONECTOMY     05/2004, 2007  . CATARACT EXTRACTION     04/2005, 06/2005  . HYSTEROSCOPY  2011  . WRIST FRACTURE SURGERY Left    06/2006    Current Outpatient Rx  . Order #: [id number] Class: Historical Med  . Order #: [id number] Class: Historical Med  . Order #: [id number] Class: Historical Med  . Order #: [id number] Class: Historical Med  . Order #: [id number] Class: Print  . Order #: [id number] Class: Normal  . Order #: [id number] Class: Historical Med  . Order #: [id number] Class: Historical Med  . Order #: [id number] Class: Historical Med    Allergies Review of patient's allergies indicates no known allergies.  Family History  Problem Relation Age of Onset  . Rheum arthritis Mother   . Lung cancer Father   . Ulcers Father   . Hodgkin's lymphoma Sister   . AVM Daughter     Social History Social History  Substance Use Topics  . Smoking status: Former Research scientist (life sciences)  . Smokeless tobacco: Never Used  . Alcohol use 1.8  oz/week    3 Glasses of wine per week    Review of Systems Constitutional: No fever/chills Eyes: No visual changes. ENT: No sore throat. Cardiovascular: Denies chest pain. Respiratory: Denies shortness of breath. Gastrointestinal: No abdominal pain.  No nausea, no vomiting.  No diarrhea.  No constipation. Genitourinary: Negative for dysuria. Musculoskeletal: Pain, swelling, and deformity and right wrist. Skin: Negative for rash. Neurological: Negative for headaches,  focal weakness or numbness.  10-point ROS otherwise negative.  ____________________________________________   PHYSICAL EXAM:  VITAL SIGNS: ED Triage Vitals [08/26/15 1900]  Enc Vitals Group     BP      Pulse      Resp      Temp      Temp src      SpO2      Weight      Height      Head Circumference      Peak Flow      Pain Score 8     Pain Loc      Pain Edu?      Excl. in [place]?     Constitutional: Alert and oriented. Well appearing and in no acute distress. Eyes: Conjunctivae are normal. PERRL. EOMI. Head: Atraumatic. Nose: No congestion/rhinnorhea. Mouth/Throat: Mucous membranes are moist.  Oropharynx non-erythematous. Neck: No stridor.  No meningeal signs.  No cervical spine tenderness to palpation. Cardiovascular: Normal rate, regular rhythm. Good peripheral circulation. Grossly normal heart sounds.   Respiratory: Normal respiratory effort.  No retractions. Lungs CTAB. Gastrointestinal: Soft and nontender. No distention.  Musculoskeletal: Swelling and ecchymosis and obvious deformity around her right wrist with soft compartments.  Normal and equal capillary refill and bilateral hands and fingers.  Normal light touch sensation to both the thumb and index finger as well as the little finger. Neurologic:  Normal speech and language. No gross focal neurologic deficits are appreciated.  Skin:  Skin is warm, dry and intact except for a subacute wound to the anterior mid right lower extremity.  There is no evidence of surrounding cellulitis. Psychiatric: Mood and affect are normal. Speech and behavior are normal.  ____________________________________________   LABS (all labs ordered are listed, but only abnormal results are displayed)  Labs Reviewed - No data to display ____________________________________________  EKG  None ____________________________________________  RADIOLOGY I, Hedi Barkan, personally viewed and evaluated these images (plain radiographs) as  part of my medical decision making, as well as reviewing the written report by the radiologist.   Dg Wrist Complete Right  Result Date: 08/26/2015 CLINICAL DATA:  Initial encounter for Pt comes into the ED via EMS with c/o tripping and falling today an injuring her right wrist. Pt arrives with splint in place. No hx of same EXAM: RIGHT WRIST - COMPLETE 3+ VIEW COMPARISON:  None. FINDINGS: Comminuted, intra-articular distal radius fracture with extensive impaction. Ulnar styloid fracture. Diffuse soft tissue swelling. Degenerate changes at the base of the thumb. IMPRESSION: Both-bone distal forearm fracture, including intra-articular, comminuted impacted distal radius fracture. Electronically Signed   By: Abigail Miyamoto M.D.   On: 08/26/2015 19:33   ____________________________________________   PROCEDURES  Procedure(s) performed:   Procedures  SPLINT APPLICATION Date/Time: 99991111 PM Authorized by: Hinda Kehr Consent: Verbal consent obtained. Risks and benefits: risks, benefits and alternatives were discussed Consent given by: patient Splint applied by: ED technician Location details: RUE Splint type: sugar tong Supplies used: orthoglass Post-procedure: The splinted body part was neurovascularly unchanged following the procedure. Patient tolerance: Patient tolerated the procedure  well with no immediate complications.     ____________________________________________   INITIAL IMPRESSION / ASSESSMENT AND PLAN / ED COURSE  Pertinent labs & imaging results that were available during my care of the patient were reviewed by me and considered in my medical decision making (see chart for details).  The patient has an obvious fracture of her right wrist confirmed on x-ray.  I will consult orthopedics and discussed the appropriate management.  The patient is neurovascularly intact and may be appropriate for outpatient follow-up with appropriate splinting.  Clinical Course  Comment By  Time  I spoke by phone with Dr. Roland Rack.  He reviewed the x-rays personally and we discussed at length the appropriate management.  We discussed the possibility of reduction of the impacted fracture, but he feels that it is so comminuted and unstable that it is unlikely that we would achieve any success with the reduction and that it would likely cause an undue amount of discomfort for the patient with minimal deficit.  Additionally she has no neurovascular compromise at this point.  He feels that she would be best served by a Heritage manager in [place], Dr. Erasmo Score.  I will discuss all of this with the patient and her family and we will put her in a sugar tong splint with pain control and close outpatient follow-up precautions including precautions regarding compartment syndrome. Hinda Kehr, MD 07/25 2058    ____________________________________________  FINAL CLINICAL IMPRESSION(S) / ED DIAGNOSES  Final diagnoses:  Distal radius fracture, right, closed, initial encounter  Ulnar fracture, right, closed, initial encounter  Fall, initial encounter     MEDICATIONS GIVEN DURING THIS VISIT:  Medications  Tdap (BOOSTRIX) injection 0.5 mL (0.5 mLs Intramuscular Given 08/26/15 2045)  oxyCODONE-acetaminophen (PERCOCET/ROXICET) 5-325 MG per tablet 2 tablet (2 tablets Oral Given 08/26/15 2052)     NEW OUTPATIENT MEDICATIONS STARTED DURING THIS VISIT:  New Prescriptions   OXYCODONE-ACETAMINOPHEN (ROXICET) 5-325 MG TABLET    Take 1-2 tablets by mouth every 4 (four) hours as needed for severe pain.      Note:  This document was prepared using Dragon voice recognition software and may include unintentional dictation errors.    Hinda Kehr, MD 08/26/15 2159

## 2015-08-27 ENCOUNTER — Ambulatory Visit
Admission: RE | Admit: 2015-08-27 | Discharge: 2015-08-27 | Disposition: A | Discharge status: Home / Self Care | Payer: Medicare Other | Source: Ambulatory Visit | Attending: Orthopedic Surgery | Admitting: Orthopedic Surgery

## 2015-08-27 ENCOUNTER — Other Ambulatory Visit: Payer: Self-pay | Admitting: Orthopedic Surgery

## 2015-08-27 DIAGNOSIS — M25531 Pain in right wrist: Secondary | ICD-10-CM

## 2015-08-28 ENCOUNTER — Other Ambulatory Visit: Payer: Self-pay | Admitting: Orthopedic Surgery

## 2015-08-29 ENCOUNTER — Encounter (HOSPITAL_COMMUNITY): Payer: Self-pay | Admitting: *Deleted

## 2015-08-29 NOTE — Anesthesia Preprocedure Evaluation (Addendum)
Anesthesia Evaluation  Patient identified by MRN, date of birth, ID band Patient awake    Reviewed: Allergy & Precautions, H&P , NPO status , Patient's Chart, lab work & pertinent test results  History of Anesthesia Complications Negative for: history of anesthetic complications  Airway Mallampati: II  TM Distance: >3 FB Neck ROM: full    Dental no notable dental hx. (+) Teeth Intact, Dental Advisory Given   Pulmonary neg pulmonary ROS, former smoker,    Pulmonary exam normal breath sounds clear to auscultation       Cardiovascular negative cardio ROS Normal cardiovascular exam Rhythm:regular Rate:Normal     Neuro/Psych negative neurological ROS     GI/Hepatic Neg liver ROS, GERD  Medicated and Controlled,  Endo/Other  negative endocrine ROS  Renal/GU negative Renal ROS     Musculoskeletal  (+) Arthritis ,   Abdominal   Peds  Hematology negative hematology ROS (+)   Anesthesia Other Findings   Reproductive/Obstetrics negative OB ROS                            Anesthesia Physical Anesthesia Plan  ASA: II  Anesthesia Plan: General and Regional   Post-op Pain Management: GA combined w/ Regional for post-op pain   Induction: Intravenous  Airway Management Planned: LMA  Additional Equipment:   Intra-op Plan:   Post-operative Plan: Extubation in OR  Informed Consent: I have reviewed the patients History and Physical, chart, labs and discussed the procedure including the risks, benefits and alternatives for the proposed anesthesia with the patient or authorized representative who has indicated his/her understanding and acceptance.   Dental Advisory Given  Plan Discussed with: Anesthesiologist, CRNA and Surgeon  Anesthesia Plan Comments:         Anesthesia Quick Evaluation

## 2015-08-30 ENCOUNTER — Encounter (HOSPITAL_COMMUNITY)
Admission: RE | Disposition: A | Discharge status: Home / Self Care | Payer: Self-pay | Source: Ambulatory Visit | Attending: Orthopedic Surgery

## 2015-08-30 ENCOUNTER — Ambulatory Visit (HOSPITAL_COMMUNITY): Payer: Medicare Other | Admitting: Anesthesiology

## 2015-08-30 ENCOUNTER — Observation Stay (HOSPITAL_COMMUNITY)
Admission: RE | Admit: 2015-08-30 | Discharge: 2015-09-01 | Disposition: A | Discharge status: Home / Self Care | Payer: Medicare Other | Source: Ambulatory Visit | Attending: Orthopedic Surgery | Admitting: Orthopedic Surgery

## 2015-08-30 ENCOUNTER — Encounter (HOSPITAL_COMMUNITY): Payer: Self-pay | Admitting: Certified Registered Nurse Anesthetist

## 2015-08-30 DIAGNOSIS — K219 Gastro-esophageal reflux disease without esophagitis: Secondary | ICD-10-CM | POA: Insufficient documentation

## 2015-08-30 DIAGNOSIS — Z87891 Personal history of nicotine dependence: Secondary | ICD-10-CM | POA: Diagnosis not present

## 2015-08-30 DIAGNOSIS — S52571A Other intraarticular fracture of lower end of right radius, initial encounter for closed fracture: Secondary | ICD-10-CM | POA: Diagnosis not present

## 2015-08-30 DIAGNOSIS — X58XXXA Exposure to other specified factors, initial encounter: Secondary | ICD-10-CM | POA: Diagnosis not present

## 2015-08-30 DIAGNOSIS — Z79899 Other long term (current) drug therapy: Secondary | ICD-10-CM | POA: Diagnosis not present

## 2015-08-30 DIAGNOSIS — S52501A Unspecified fracture of the lower end of right radius, initial encounter for closed fracture: Secondary | ICD-10-CM | POA: Diagnosis present

## 2015-08-30 DIAGNOSIS — S52611A Displaced fracture of right ulna styloid process, initial encounter for closed fracture: Secondary | ICD-10-CM | POA: Diagnosis not present

## 2015-08-30 DIAGNOSIS — Z7951 Long term (current) use of inhaled steroids: Secondary | ICD-10-CM | POA: Insufficient documentation

## 2015-08-30 HISTORY — PX: ORIF WRIST FRACTURE: SHX2133

## 2015-08-30 HISTORY — DX: Malignant (primary) neoplasm, unspecified: C80.1

## 2015-08-30 HISTORY — DX: Unspecified osteoarthritis, unspecified site: M19.90

## 2015-08-30 SURGERY — OPEN REDUCTION INTERNAL FIXATION (ORIF) WRIST FRACTURE
Anesthesia: Regional | Laterality: Right

## 2015-08-30 MED ORDER — PROPOFOL 10 MG/ML IV BOLUS
INTRAVENOUS | Status: AC
  Filled 2015-08-30: qty 20

## 2015-08-30 MED ORDER — PROPOFOL 500 MG/50ML IV EMUL
INTRAVENOUS | Status: DC | PRN
  Administered 2015-08-30: 100 ug/kg/min via INTRAVENOUS

## 2015-08-30 MED ORDER — ROCURONIUM BROMIDE 50 MG/5ML IV SOLN
INTRAVENOUS | Status: AC
  Filled 2015-08-30: qty 1

## 2015-08-30 MED ORDER — 0.9 % SODIUM CHLORIDE (POUR BTL) OPTIME
TOPICAL | Status: DC | PRN
  Administered 2015-08-30: 1000 mL

## 2015-08-30 MED ORDER — POLYVINYL ALCOHOL 1.4 % OP SOLN
1.0000 [drp] | Freq: Every day | OPHTHALMIC | Status: DC | PRN
  Filled 2015-08-30: qty 15

## 2015-08-30 MED ORDER — ALPRAZOLAM 0.5 MG PO TABS
0.5000 mg | ORAL_TABLET | Freq: Four times a day (QID) | ORAL | Status: DC | PRN
  Administered 2015-08-30 – 2015-08-31 (×2): 0.5 mg via ORAL
  Filled 2015-08-30 (×2): qty 1

## 2015-08-30 MED ORDER — ONDANSETRON HCL 4 MG/2ML IJ SOLN
INTRAMUSCULAR | Status: AC
  Filled 2015-08-30: qty 2

## 2015-08-30 MED ORDER — CHLORHEXIDINE GLUCONATE 4 % EX LIQD
60.0000 mL | Freq: Once | CUTANEOUS | Status: DC
Start: 2015-08-30 — End: 2015-08-30

## 2015-08-30 MED ORDER — OXYCODONE HCL 5 MG PO TABS
5.0000 mg | ORAL_TABLET | ORAL | Status: DC | PRN
  Administered 2015-08-30: 5 mg via ORAL
  Administered 2015-08-31 (×5): 10 mg via ORAL
  Filled 2015-08-30: qty 2
  Filled 2015-08-30: qty 1
  Filled 2015-08-30 (×4): qty 2

## 2015-08-30 MED ORDER — METHOCARBAMOL 500 MG PO TABS
500.0000 mg | ORAL_TABLET | Freq: Four times a day (QID) | ORAL | Status: DC | PRN
  Administered 2015-08-30 – 2015-08-31 (×3): 500 mg via ORAL
  Filled 2015-08-30 (×3): qty 1

## 2015-08-30 MED ORDER — ONDANSETRON HCL 8 MG PO TABS
8.0000 mg | ORAL_TABLET | Freq: Three times a day (TID) | ORAL | Status: DC | PRN
  Filled 2015-08-30: qty 1

## 2015-08-30 MED ORDER — CEFAZOLIN SODIUM-DEXTROSE 2-4 GM/100ML-% IV SOLN
2.0000 g | INTRAVENOUS | Status: AC
Start: 2015-08-30 — End: 2015-08-30
  Administered 2015-08-30: 2 g via INTRAVENOUS

## 2015-08-30 MED ORDER — PROPOFOL 500 MG/50ML IV EMUL
INTRAVENOUS | Status: AC
  Filled 2015-08-30: qty 100

## 2015-08-30 MED ORDER — CEFAZOLIN IN D5W 1 GM/50ML IV SOLN
1.0000 g | INTRAVENOUS | Status: DC
  Filled 2015-08-30: qty 50

## 2015-08-30 MED ORDER — LACTATED RINGERS IV SOLN: INTRAVENOUS | Status: DC

## 2015-08-30 MED ORDER — LACTATED RINGERS IV SOLN
INTRAVENOUS | Status: DC
  Administered 2015-08-30: 08:00:00 via INTRAVENOUS

## 2015-08-30 MED ORDER — POLYVINYL ALCOHOL-POVIDONE 1.4-0.6 % OP SOLN: 1.0000 [drp] | Freq: Every day | OPHTHALMIC | Status: DC | PRN

## 2015-08-30 MED ORDER — FENTANYL CITRATE (PF) 250 MCG/5ML IJ SOLN
INTRAMUSCULAR | Status: AC
  Filled 2015-08-30: qty 5

## 2015-08-30 MED ORDER — PROPOFOL 10 MG/ML IV BOLUS
INTRAVENOUS | Status: DC | PRN
  Administered 2015-08-30: 150 mg via INTRAVENOUS

## 2015-08-30 MED ORDER — LIDOCAINE HCL (CARDIAC) 20 MG/ML IV SOLN
INTRAVENOUS | Status: DC | PRN
  Administered 2015-08-30: 60 mg via INTRAVENOUS

## 2015-08-30 MED ORDER — FENTANYL CITRATE (PF) 100 MCG/2ML IJ SOLN
100.0000 ug | Freq: Once | INTRAMUSCULAR | Status: AC
  Administered 2015-08-30: 100 ug via INTRAVENOUS

## 2015-08-30 MED ORDER — PHENYLEPHRINE 40 MCG/ML (10ML) SYRINGE FOR IV PUSH (FOR BLOOD PRESSURE SUPPORT)
PREFILLED_SYRINGE | INTRAVENOUS | Status: AC
  Filled 2015-08-30: qty 10

## 2015-08-30 MED ORDER — ROPIVACAINE HCL 5 MG/ML IJ SOLN
INTRAMUSCULAR | Status: DC | PRN
  Administered 2015-08-30: 25 mL via PERINEURAL

## 2015-08-30 MED ORDER — TIMOLOL MALEATE 0.5 % OP SOLN
1.0000 [drp] | Freq: Every day | OPHTHALMIC | Status: DC
  Administered 2015-08-30 – 2015-08-31 (×2): 1 [drp] via OPHTHALMIC
  Filled 2015-08-30: qty 5

## 2015-08-30 MED ORDER — MORPHINE SULFATE (PF) 2 MG/ML IV SOLN
1.0000 mg | INTRAVENOUS | Status: DC | PRN
Start: 2015-08-30 — End: 2015-09-01
  Administered 2015-08-30: 1 mg via INTRAVENOUS
  Filled 2015-08-30: qty 1

## 2015-08-30 MED ORDER — FENTANYL CITRATE (PF) 100 MCG/2ML IJ SOLN
INTRAMUSCULAR | Status: AC
  Administered 2015-08-30: 100 ug via INTRAVENOUS
  Filled 2015-08-30: qty 2

## 2015-08-30 MED ORDER — DEXTROSE 5 % IV SOLN
500.0000 mg | Freq: Four times a day (QID) | INTRAVENOUS | Status: DC | PRN
  Filled 2015-08-30: qty 5

## 2015-08-30 MED ORDER — FENTANYL CITRATE (PF) 100 MCG/2ML IJ SOLN: 25.0000 ug | INTRAMUSCULAR | Status: DC | PRN

## 2015-08-30 MED ORDER — LIDOCAINE 2% (20 MG/ML) 5 ML SYRINGE
INTRAMUSCULAR | Status: AC
  Filled 2015-08-30: qty 5

## 2015-08-30 MED ORDER — ONDANSETRON HCL 4 MG/2ML IJ SOLN: 4.0000 mg | Freq: Once | INTRAMUSCULAR | Status: DC | PRN

## 2015-08-30 MED ORDER — ONDANSETRON HCL 4 MG/2ML IJ SOLN
INTRAMUSCULAR | Status: DC | PRN
  Administered 2015-08-30: 4 mg via INTRAVENOUS

## 2015-08-30 MED ORDER — CEFAZOLIN SODIUM-DEXTROSE 2-4 GM/100ML-% IV SOLN
INTRAVENOUS | Status: AC
  Filled 2015-08-30: qty 100

## 2015-08-30 MED ORDER — PANTOPRAZOLE SODIUM 40 MG PO TBEC
40.0000 mg | DELAYED_RELEASE_TABLET | Freq: Every day | ORAL | Status: DC
  Administered 2015-08-30 – 2015-09-01 (×3): 40 mg via ORAL
  Filled 2015-08-30 (×3): qty 1

## 2015-08-30 MED ORDER — POLYETHYLENE GLYCOL 3350 17 G PO PACK
17.0000 g | PACK | Freq: Every day | ORAL | Status: DC
  Administered 2015-08-30 – 2015-09-01 (×3): 17 g via ORAL
  Filled 2015-08-30 (×3): qty 1

## 2015-08-30 MED ORDER — PHENYLEPHRINE HCL 10 MG/ML IJ SOLN
INTRAMUSCULAR | Status: DC | PRN
  Administered 2015-08-30 (×2): 80 ug via INTRAVENOUS
  Administered 2015-08-30 (×5): 40 ug via INTRAVENOUS

## 2015-08-30 MED ORDER — CEFAZOLIN IN D5W 1 GM/50ML IV SOLN
1.0000 g | Freq: Three times a day (TID) | INTRAVENOUS | Status: DC
  Administered 2015-08-30 – 2015-08-31 (×3): 1 g via INTRAVENOUS
  Filled 2015-08-30 (×5): qty 50

## 2015-08-30 MED ORDER — POLYETHYLENE GLYCOL 3350 17 GM/SCOOP PO POWD: 1.0000 | Freq: Every day | ORAL | Status: DC

## 2015-08-30 MED ORDER — LACTATED RINGERS IV SOLN
INTRAVENOUS | Status: DC | PRN
  Administered 2015-08-30: 08:00:00 via INTRAVENOUS

## 2015-08-30 SURGICAL SUPPLY — 53 items
BANDAGE ELASTIC 3 VELCRO ST LF (GAUZE/BANDAGES/DRESSINGS) ×1 IMPLANT
BANDAGE ELASTIC 4 VELCRO ST LF (GAUZE/BANDAGES/DRESSINGS) ×1 IMPLANT
BIT DRILL 2.2 SS TIBIAL (BIT) ×1 IMPLANT
BLADE SURG ROTATE 9660 (MISCELLANEOUS) IMPLANT
BNDG CMPR 9X4 STRL LF SNTH (GAUZE/BANDAGES/DRESSINGS) ×1
BNDG ESMARK 4X9 LF (GAUZE/BANDAGES/DRESSINGS) ×2 IMPLANT
BNDG GAUZE ELAST 4 BULKY (GAUZE/BANDAGES/DRESSINGS) ×1 IMPLANT
CANISTER SUCTION 2500CC (MISCELLANEOUS) ×2 IMPLANT
CORDS BIPOLAR (ELECTRODE) ×2 IMPLANT
COVER SURGICAL LIGHT HANDLE (MISCELLANEOUS) ×2 IMPLANT
CUFF TOURNIQUET SINGLE 18IN (TOURNIQUET CUFF) ×2 IMPLANT
DRAIN TLS ROUND 10FR (DRAIN) IMPLANT
DRAPE OEC MINIVIEW 54X84 (DRAPES) ×1 IMPLANT
DRAPE SURG 17X23 STRL (DRAPES) ×2 IMPLANT
DRSG ADAPTIC 3X8 NADH LF (GAUZE/BANDAGES/DRESSINGS) ×1 IMPLANT
GAUZE SPONGE 4X4 12PLY STRL (GAUZE/BANDAGES/DRESSINGS) ×1 IMPLANT
GAUZE XEROFORM 5X9 LF (GAUZE/BANDAGES/DRESSINGS) ×1 IMPLANT
GLOVE BIOGEL M 8.0 STRL (GLOVE) ×2 IMPLANT
GLOVE SS BIOGEL STRL SZ 8 (GLOVE) ×1 IMPLANT
GLOVE SUPERSENSE BIOGEL SZ 8 (GLOVE) ×1
GOWN STRL REUS W/ TWL LRG LVL3 (GOWN DISPOSABLE) ×1 IMPLANT
GOWN STRL REUS W/ TWL XL LVL3 (GOWN DISPOSABLE) ×2 IMPLANT
GOWN STRL REUS W/TWL LRG LVL3 (GOWN DISPOSABLE) ×2
GOWN STRL REUS W/TWL XL LVL3 (GOWN DISPOSABLE) ×4
KIT BASIN OR (CUSTOM PROCEDURE TRAY) ×2 IMPLANT
KIT ROOM TURNOVER OR (KITS) ×2 IMPLANT
NS IRRIG 1000ML POUR BTL (IV SOLUTION) ×2 IMPLANT
PACK ORTHO EXTREMITY (CUSTOM PROCEDURE TRAY) ×2 IMPLANT
PAD ARMBOARD 7.5X6 YLW CONV (MISCELLANEOUS) ×4 IMPLANT
PAD CAST 4YDX4 CTTN HI CHSV (CAST SUPPLIES) IMPLANT
PADDING CAST COTTON 4X4 STRL (CAST SUPPLIES) ×2
PEG LOCKING SMOOTH 2.2X18 (Peg) ×1 IMPLANT
PEG LOCKING SMOOTH 2.2X20 (Screw) ×2 IMPLANT
PEG LOCKING SMOOTH 2.2X22 (Screw) ×1 IMPLANT
PEG LOCKING SMOOTH 2.2X24 (Peg) ×2 IMPLANT
PLATE RIGHT VOLAR RIM DVR (Plate) ×1 IMPLANT
PUTTY DBM STAGRAFT PLUS 2CC (Putty) ×1 IMPLANT
SCREW LOCK 14X2.7X 3 LD TPR (Screw) IMPLANT
SCREW LOCK 16X2.7X 3 LD TPR (Screw) IMPLANT
SCREW LOCKING 2.7X13MM (Screw) ×1 IMPLANT
SCREW LOCKING 2.7X14 (Screw) ×6 IMPLANT
SCREW LOCKING 2.7X16 (Screw) ×2 IMPLANT
SCREW MULTI DIRECTIONAL 2.7X20 (Screw) ×1 IMPLANT
SPLINT FIBERGLASS 3X35 (CAST SUPPLIES) ×1 IMPLANT
SUT MNCRL AB 4-0 PS2 18 (SUTURE) ×4 IMPLANT
SUT PROLENE 3 0 PS 2 (SUTURE) IMPLANT
SUT VIC AB 3-0 FS2 27 (SUTURE) ×1 IMPLANT
SYSTEM CHEST DRAIN TLS 7FR (DRAIN) ×1 IMPLANT
TOWEL OR 17X24 6PK STRL BLUE (TOWEL DISPOSABLE) ×2 IMPLANT
TOWEL OR 17X26 10 PK STRL BLUE (TOWEL DISPOSABLE) ×2 IMPLANT
TUBE CONNECTING 12X1/4 (SUCTIONS) ×2 IMPLANT
TUBE EVACUATION TLS (MISCELLANEOUS) ×1 IMPLANT
WATER STERILE IRR 1000ML POUR (IV SOLUTION) ×2 IMPLANT

## 2015-08-30 NOTE — Anesthesia Postprocedure Evaluation (Signed)
Anesthesia Post Note  Patient: Courtney Willis  Procedure(s) Performed: Procedure(s) (LRB): OPEN REDUCTION INTERNAL FIXATION (ORIF) RIGHT WRIST FRACTURE AND REPAIR AS NECESSARY (Right)  Patient location during evaluation: PACU Anesthesia Type: General and Regional Level of consciousness: awake and alert Pain management: pain level controlled Vital Signs Assessment: post-procedure vital signs reviewed and stable Respiratory status: spontaneous breathing, nonlabored ventilation, respiratory function stable and patient connected to nasal cannula oxygen Cardiovascular status: blood pressure returned to baseline and stable Postop Assessment: no signs of nausea or vomiting Anesthetic complications: no Comments: Volatile gas was avoided, straight TIVA run with great effect, has no pain    Last Vitals:  Vitals:   08/30/15 0848 08/30/15 1043  BP: (!) 170/84 117/73  Pulse: 71 64  Resp: 13 11  Temp:  36.4 C    Last Pain:  Vitals:   08/30/15 0737  TempSrc: Oral                 Zenaida Deed

## 2015-08-30 NOTE — Transfer of Care (Signed)
Immediate Anesthesia Transfer of Care Note  Patient: Courtney Willis  Procedure(s) Performed: Procedure(s): OPEN REDUCTION INTERNAL FIXATION (ORIF) RIGHT WRIST FRACTURE AND REPAIR AS NECESSARY (Right)  Patient Location: PACU  Anesthesia Type:General and Regional  Level of Consciousness: awake, alert  and oriented  Airway & Oxygen Therapy: Patient Spontanous Breathing  Post-op Assessment: Report given to RN, Post -op Vital signs reviewed and stable and Patient moving all extremities X 4  Post vital signs: Reviewed and stable  Last Vitals:  Vitals:   08/30/15 0847 08/30/15 0848  BP:  (!) 170/84  Pulse: 69 71  Resp: 18 13  Temp:      Last Pain:  Vitals:   08/30/15 0737  TempSrc: Oral      Patients Stated Pain Goal: 5 ([id number])  Complications: No apparent anesthesia complications

## 2015-08-30 NOTE — Op Note (Signed)
dictation [phone]  Patient went ORIF of a comminuted complex right wrist fracture greater than 5 part interarticular. Allograft bone graft was placed in the form of stagraft  She'll be admitted for IV and by sharp postop observation of measures  Saba Neuman M.D.

## 2015-08-30 NOTE — Anesthesia Procedure Notes (Signed)
Anesthesia Regional Block:  Supraclavicular block  Pre-Anesthetic Checklist: ,, timeout performed, Correct Patient, Correct Site, Correct Laterality, Correct Procedure, Correct Position, site marked, Risks and benefits discussed,  Surgical consent,  Pre-op evaluation,  At surgeon's request and post-op pain management  Laterality: Right  Prep: chloraprep       Needles:  Injection technique: Single-shot  Needle Type: Stimiplex     Needle Length: 9cm 9 cm Needle Gauge: 21 G    Additional Needles:  Procedures: ultrasound guided (picture in chart) Supraclavicular block Narrative:  Injection made incrementally with aspirations every 5 mL.  Performed by: Personally  Anesthesiologist: Mayda Shippee  Additional Notes: Risks, benefits and alternative to block explained extensively.  Patient tolerated procedure well, without complications.

## 2015-08-30 NOTE — Op Note (Signed)
NAME:  Courtney Willis, Courtney Willis NO.:  [id number]  MEDICAL RECORD NO.:  [id number]  LOCATION:  [facility]                         FACILITY:  [facility]  PHYSICIAN:  Satira Anis. Shaquitta Burbridge, M.D.DATE OF BIRTH:  July 09, 1934  DATE OF PROCEDURE: DATE OF DISCHARGE:                              OPERATIVE REPORT   PREOPERATIVE DIAGNOSIS:  Comminuted complex intra-articular distal radius fracture greater than 5-part, right upper extremity.  POSTOPERATIVE DIAGNOSIS:  Comminuted complex intra-articular distal radius fracture greater than 5-part, right upper extremity.  PROCEDURE: 1. Open reduction and internal fixation, greater than 5-part distal     radius fracture, right upper extremity. 2. Four view x-ray series performed, examined, and interpreted by     myself, right wrist and hand. 3. Closed treatment ulnar styloid fracture.  SURGEON:  Satira Anis. Amedeo Plenty, M.D.  ASSISTANT:  Avelina Laine, P.A.-C.  COMPLICATIONS:  None.  ANESTHESIA:  General preoperative block.  TOURNIQUET TIME:  Less than an hour.  DRAINS:  One.  INDICATIONS:  A pleasant female, who presents with the above-mentioned diagnosis.  She has a very comminuted complex fracture.  Preoperative CT reveals a significant disarray and soft tissue abnormality.  I have discussed the risks and benefits of surgery including risk of infection, anesthesia damage to normal structures, malunion, nonunion etc.  With this in mind, she desires to proceed.  PROCEDURE DESCRIPTION:  The patient was seen by myself and Anesthesia, taken to the operative suite, and underwent smooth induction of general anesthetic.  Preoperatively, she had a block placed by the anesthesia staff.  She was given Hibiclens/chlorhexidine prescrubbed, followed by 10 minutes surgical Betadine scrub and paint.  Sterile field was secured.  Preoperative antibiotics were given.  Outline marks were made. Arm was elevated, tourniquet was insufflated to 250 mmHg.  After pre  and postop check was complete, the operation commenced with a volar radial incision.  Dissection was carried down.  I performed an extended volar radial incision and released portions of the deep transverse carpal ligament off the scaphoid.  This was done with sliding technique protecting the superficial branch of the radial artery.  FCR tendon sheath was incised palmarly and dorsally in doing so, and the carpal canal contents were retracted ulnarly.  This allowed access to the fracture site.  Following this, the fracture was accessed and underwent reduction, 5 mL stay graft were placed very carefully in the void, and I was able to achieve a nice recreation of the anatomy.  The patient had a carefully placed volar rim plate on the right wrist, adequate height, inclination, and volar tilt were observed.  The patient had excellent radiographic stability and we were able to realign her so as to restore her geometry in terms of radial height, inclination, and volar tilt. Following this, the patient underwent irrigation, pronator closure. Screw lengths were all checked and all looked well.  This was an ORIF greater than 5-part comminuted complex intra-articular distal radius fracture with extended volar rim DVR plate.  Following this, the patient then had further irrigation applied followed by placement of a TLS drain.  X-ray series was performed for [facility] and final copies were taken.  Live fluoro  and evaluation under anesthesia revealed stability about the ulna.  I was pleased this in the findings.  I did not have to perform an ulnar styloid ORIF.  This was treated closed.  She was dressed with Adaptic, Xeroform, skin was very carefully managed. The pronator was closed with Vicryl, skin edge with Prolene.  A TLS drain hooked up to suction.  She was taken to recovery room.  We will plan for arm elevation, range of motion, and other measures.  We will plan to see her back in the office  tomorrow, notify should any problems occur.  We will plan to see her tomorrow in the hospital and discharge her if she is stable.  These notes have been discussed.  All questions have been encouraged and answered.  Pleasure to see her today and participate in her care plan.     Satira Anis. Amedeo Plenty, M.D.     [facility]  D:  08/30/2015  T:  08/30/2015  Job:  [id number]

## 2015-08-30 NOTE — H&P (Signed)
Courtney Willis is an 81 y.o. female.   Chief Complaint: Comminuted right wrist fracture HPI: Patient presents with a comminuted right wrist fracture will plan for open reduction internal fixation. She denies other injury.  Patient presents for evaluation and treatment of the of their upper extremity predicament. The patient denies neck, back, chest or  abdominal pain. The patient notes that they have no lower extremity problems. The patients primary complaint is noted. We are planning surgical care pathway for the upper extremity.  Past Medical History:  Diagnosis Date  . Acid reflux 02/26/2008  . Arthritis   . 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 Avitaminosis D 08/12/2014  . Bowel disease 02/16/2008  . Bunion 08/12/2014  . Cancer [facility])    Skin cancer- basal- upper arm , upper chest  . CN (constipation) 08/12/2014  . DD (diverticular disease) 02/16/2008  . Elevated liver enzymes 08/12/2014  . Fibroids, submucosal 08/12/2014   of her lower lip   . Hypercholesteremia 08/12/2014  . 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 Post menopausal syndrome 08/12/2014    Past Surgical History:  Procedure Laterality Date  . BUNIONECTOMY     05/2004, 2007  . CATARACT EXTRACTION     04/2005, 06/2005  . COLONOSCOPY W/ POLYPECTOMY    . HYSTEROSCOPY  2011  . UPPER GI ENDOSCOPY    . WRIST FRACTURE SURGERY Left    06/2006    Family History  Problem Relation Age of Onset  . Rheum arthritis Mother   . Lung cancer Father   . Ulcers Father   . Hodgkin's lymphoma Sister   . AVM Daughter    Social History:  reports that she has quit smoking. She has never used smokeless tobacco. She reports that she drinks about 1.8 oz of alcohol per week . She reports that she does not use drugs.  Allergies: No Known Allergies  Medications Prior to Admission  Medication Sig Dispense Refill  . ascorbic acid (VITAMIN C) 500 MG tablet Take 1 tablet by mouth daily.    . chlorpheniramine (ALLERGY) 4 MG tablet Take 4 mg by mouth 2 (two) times  daily as needed for allergies.    . Cholecalciferol (VITAMIN D3) 1000 UNITS CAPS Take 1 capsule by mouth daily.    . fluticasone (FLONASE) 50 MCG/ACT nasal spray Place 2 sprays into the nose daily.    Marland Kitchen oxyCODONE-acetaminophen (ROXICET) 5-325 MG tablet Take 1-2 tablets by mouth every 4 (four) hours as needed for severe pain. 30 tablet 0  . pantoprazole (PROTONIX) 40 MG tablet TAKE 1 TABLET (40 MG TOTAL) BY MOUTH DAILY. 30 tablet 11  . polyethylene glycol powder (GLYCOLAX/MIRALAX) powder Take 1 Container by mouth daily.     . polyvinyl alcohol-povidone (REFRESH) 1.4-0.6 % ophthalmic solution Apply 1 drop to eye daily as needed (dry eyes).     . timolol (TIMOPTIC) 0.5 % ophthalmic solution Place 1 drop into both eyes at bedtime.  5  . ondansetron (ZOFRAN) 8 MG tablet Take 8 mg by mouth every 8 (eight) hours as needed for nausea or vomiting.      No results found for this or any previous visit (from the past 48 hour(s)). No results found.  Review of Systems  Eyes: Negative.   Respiratory: Negative.   Cardiovascular: Negative.   Gastrointestinal: Negative.   Genitourinary: Negative.   Musculoskeletal: Negative.   Neurological: Negative.   Endo/Heme/Allergies: Negative.     Blood pressure (!) 170/84, pulse 71, temperature 98.6 F (37 C), temperature source Oral, resp.  rate 13, height 5\' 7"  (1.702 m), weight 61.7 kg (136 lb), SpO2 99 %. Physical Exam  Right wrist fracture comminuted complex  I reviewed the CT scan and x-rays. We'll plan for open reduction internal fixation. She remains neurovascularly intact. She will have a preoperative nerve block. She is stable and denies other issues today.  The patient is alert and oriented in no acute distress. The patient complains of pain in the affected upper extremity.  The patient is noted to have a normal HEENT exam. Lung fields show equal chest expansion and no shortness of breath. Abdomen exam is nontender without distention. Lower  extremity examination does not show any fracture dislocation or blood clot symptoms. Pelvis is stable and the neck and back are stable and nontender. Assessment/Plan Plan for open reduction internal fixation right wrist with repair reconstruction is necessary and allograft bone graft placement We are planning surgery for your upper extremity. The risk and benefits of surgery to include risk of bleeding, infection, anesthesia,  damage to normal structures and failure of the surgery to accomplish its intended goals of relieving symptoms and restoring function have been discussed in detail. With this in mind we plan to proceed. I have specifically discussed with the patient the pre-and postoperative regime and the dos and don'ts and risk and benefits in great detail. Risk and benefits of surgery also include risk of dystrophy(CRPS), chronic nerve pain, failure of the healing process to go onto completion and other inherent risks of surgery The relavent the pathophysiology of the disease/injury process, as well as the alternatives for treatment and postoperative course of action has been discussed in great detail with the patient who desires to proceed.  We will do everything in our power to help you (the patient) restore function to the upper extremity. It is a pleasure to see this patient today.  Paulene Floor, MD 08/30/2015, 9:14 AM

## 2015-08-30 NOTE — Anesthesia Procedure Notes (Addendum)
Procedure Name: LMA Insertion Date/Time: 08/30/2015 9:32 AM Performed by: Garrison Columbus T Pre-anesthesia Checklist: Patient identified, Emergency Drugs available, Suction available and Patient being monitored Patient Re-evaluated:Patient Re-evaluated prior to inductionOxygen Delivery Method: Circle System Utilized Preoxygenation: Pre-oxygenation with 100% oxygen Intubation Type: IV induction Ventilation: Mask ventilation without difficulty LMA: LMA inserted LMA Size: 4.0 Number of attempts: 1 Placement Confirmation: positive ETCO2 and breath sounds checked- equal and bilateral Tube secured with: Tape Dental Injury: Teeth and Oropharynx as per pre-operative assessment

## 2015-08-30 NOTE — Evaluation (Signed)
Occupational Therapy Evaluation Patient Details Name: Courtney Willis MRN: [id number] DOB: [birth year] Today's Date: 08/30/2015    History of Present Illness Pt is a 81 y.o. female s/p OPEN REDUCTION INTERNAL FIXATION (ORIF) RIGHT WRIST FRACTURE AND REPAIR AS NECESSARY. PMHx: Arithrits, Cancer, Hypercholesteremia.    Clinical Impression   Pt reports she was independent with ADL PTA. Currently pt is min assist for functional mobility and ADL with the exception of mod assist for UB ADL. Educated pt on edema management strategies, compensatory strategies for ADL, and home safety. Pt currently with active nerve block causing decreased sensation and AROM of RUE. Pt planning to d/c home with 24/7 supervision from family/friends. Pt would benefit from continued skilled OT to address established goals.    Follow Up Recommendations  Supervision/Assistance - 24 hour;Other (comment) (progress rehab per MD at follow up)    Equipment Recommendations  None recommended by OT    Recommendations for Other Services PT consult     Precautions / Restrictions Precautions Precautions: Fall Restrictions Other Position/Activity Restrictions: No WB orders but kept pt NWB RUE      Mobility Bed Mobility Overal bed mobility: Needs Assistance Bed Mobility: Supine to Sit;Sit to Supine     Supine to sit: Min assist Sit to supine: Min assist   General bed mobility comments: Min assist to manage RUE due to nerve block still in effect and pt unable to move arm.  Transfers Overall transfer level: Needs assistance Equipment used: 1 person hand held assist Transfers: Sit to/from Stand Sit to Stand: Min assist         General transfer comment: Min assist to manage RUE.    Balance Overall balance assessment: Needs assistance Sitting-balance support: Feet supported;Single extremity supported Sitting balance-Leahy Scale: Good     Standing balance support: Single extremity supported Standing  balance-Leahy Scale: Fair                              ADL Overall ADL's : Needs assistance/impaired Eating/Feeding: Set up;Sitting   Grooming: Minimal assistance;Standing;Wash/dry hands   Upper Body Bathing: Moderate assistance;Sitting   Lower Body Bathing: Minimal assistance;Sit to/from stand   Upper Body Dressing : Moderate assistance;Sitting Upper Body Dressing Details (indicate cue type and reason): Educated pt on compensatory strategies for UB dressing. Lower Body Dressing: Minimal assistance;Sit to/from stand Lower Body Dressing Details (indicate cue type and reason): Pt able to adjust socks sitting EOB. Toilet Transfer: Minimal assistance;Ambulation;Regular Glass blower/designer Details (indicate cue type and reason): Assist to manage R arm. Toileting- Water quality scientist and Hygiene: Min guard;Sitting/lateral lean Toileting - Clothing Manipulation Details (indicate cue type and reason): for toilet hygiene     Functional mobility during ADLs: Minimal assistance General ADL Comments: Educated pt on elevation, ice, and digit ROM for edema control.      Vision     Perception     Praxis      Pertinent Vitals/Pain Pain Assessment: No/denies pain (nerve block still in effect)     Hand Dominance Right   Extremity/Trunk Assessment Upper Extremity Assessment Upper Extremity Assessment: RUE deficits/detail RUE Deficits / Details: Nerve block still in effect, pt with no AROM and cannot feel arm. RUE: Unable to fully assess due to immobilization RUE Sensation: decreased light touch RUE Coordination: decreased fine motor;decreased gross motor   Lower Extremity Assessment Lower Extremity Assessment: Defer to PT evaluation   Cervical / Trunk Assessment Cervical / Trunk Assessment: Normal  Communication Communication Communication: No difficulties   Cognition Arousal/Alertness: Awake/alert Behavior During Therapy: WFL for tasks  assessed/performed Overall Cognitive Status: Within Functional Limits for tasks assessed                     General Comments       Exercises       Shoulder Instructions      Home Living Family/patient expects to be discharged to:: Private residence Living Arrangements: Alone Available Help at Discharge: Family;Friend(s);Available 24 hours/day (family/friends coming to stay with pt) Type of Home: House Home Access: Stairs to enter [employer] of Steps: 7 Entrance Stairs-Rails: Left Home Layout: One level     Bathroom Shower/Tub: Tub/shower unit Shower/tub characteristics: Curtain Biochemist, clinical: Standard     Home Equipment: Environmental consultant - 2 wheels;Cane - single point;Bedside commode;Shower seat;Grab bars - tub/shower          Prior Functioning/Environment Level of Independence: Independent             OT Diagnosis: Generalized weakness;Acute pain   OT Problem List: Decreased strength;Decreased range of motion;Impaired balance (sitting and/or standing);Impaired sensation;Impaired UE functional use;Pain   OT Treatment/Interventions: Self-care/ADL training;Therapeutic exercise;Energy conservation;DME and/or AE instruction;Therapeutic activities;Patient/family education;Balance training    OT Goals(Current goals can be found in the care plan section) Acute Rehab OT Goals Patient Stated Goal: return to being independent OT Goal Formulation: With patient Time For Goal Achievement: 09/13/15 Potential to Achieve Goals: Good ADL Goals Pt Will Perform Upper Body Bathing: with set-up;with supervision;sitting Pt Will Perform Upper Body Dressing: with set-up;with supervision;sitting Pt Will Perform Tub/Shower Transfer: Tub transfer;with min guard assist;ambulating;shower seat Additional ADL Goal #1: Pt will independently verbally recall 3 edema management strategies.  OT Frequency: Min 2X/week   Barriers to D/C:            Co-evaluation               End of Session Equipment Utilized During Treatment: Gait belt  Activity Tolerance: Patient tolerated treatment well Patient left: in bed;with call bell/phone within reach;with SCD's reapplied   Time: [id number] OT Time Calculation (min): 24 min Charges:  OT General Charges $OT Visit: 1 Procedure OT Evaluation $OT Eval Moderate Complexity: 1 Procedure OT Treatments $Self Care/Home Management : 8-22 mins G-Codes: OT G-codes **NOT FOR INPATIENT CLASS** Functional Assessment Tool Used: Clinical judgement Functional Limitation: Self care Self Care Current Status [id number]): At least 20 percent but less than 40 percent impaired, limited or restricted Self Care Goal Status [id number]): At least 1 percent but less than 20 percent impaired, limited or restricted   Binnie Kand M.S., OTR/L Pager: [phone]  08/30/2015, 5:22 PM

## 2015-08-31 DIAGNOSIS — S52571A Other intraarticular fracture of lower end of right radius, initial encounter for closed fracture: Secondary | ICD-10-CM | POA: Diagnosis not present

## 2015-08-31 MED ORDER — CEPHALEXIN 500 MG PO CAPS
500.0000 mg | ORAL_CAPSULE | Freq: Four times a day (QID) | ORAL | Status: DC
  Administered 2015-08-31 – 2015-09-01 (×3): 500 mg via ORAL
  Filled 2015-08-31 (×3): qty 1

## 2015-08-31 NOTE — Progress Notes (Signed)
Physical Therapy Evaluation and Discharge Patient Details Name: Courtney Willis MRN: [id number] DOB: [birth year] Today's Date: 08/31/2015   History of Present Illness  Pt is a 81 y.o. female s/p OPEN REDUCTION INTERNAL FIXATION (ORIF) RIGHT WRIST FRACTURE AND REPAIR AS NECESSARY. PMHx: Arithrits, Cancer, Hypercholesteremia.   Clinical Impression  Patient sitting up in chair on arrival, agreeable to evaluation.  Demonstrates Supervision / MOD I ability for ambulation including stairs using Right hand rail and modified technique.  No distance limitations, good safety awareness.  Patient is able to manage home environment at this time, may benefit from Supervision assist at times, but otherwise independent.  PT services evaluation only, will sign off, patient ok to DC when medically appropriate.    Follow Up Recommendations No PT follow up;Supervision - Intermittent    Equipment Recommendations  None recommended by PT    Recommendations for Other Services       Precautions / Restrictions Precautions Precautions: Fall Required Braces or Orthoses: Sling Restrictions Weight Bearing Restrictions: Yes RUE Weight Bearing: Non weight bearing Other Position/Activity Restrictions: No WB orders but kept pt NWB RUE      Mobility  Bed Mobility Overal bed mobility: Modified Independent Bed Mobility: Supine to Sit;Sit to Supine     Supine to sit: Modified independent (Device/Increase time) Sit to supine: Modified independent (Device/Increase time)   General bed mobility comments: able to transition into sitting while managing RUE. no physical assistance required  Transfers Overall transfer level: Needs assistance Equipment used: None Transfers: Sit to/from Stand Sit to Stand: Modified independent (Device/Increase time) Stand pivot transfers: Modified independent (Device/Increase time)       General transfer comment: Using L UE to assist and maintain  safety  Ambulation/Gait Ambulation/Gait assistance: Modified independent (Device/Increase time) Ambulation Distance (Feet): 800 Feet Assistive device: None Gait Pattern/deviations: WFL(Within Functional Limits)   Gait velocity interpretation: at or above normal speed for age/gender General Gait Details: Mild decrease speed due to R UE pain.  Stairs Stairs: Yes Stairs assistance: Supervision Stair Management: One rail Right Number of Stairs: 10 General stair comments: Instruction in side stepping up stairs, due to no rail on Left side.  Wheelchair Mobility    Modified Rankin (Stroke Patients Only)       Balance Overall balance assessment: Modified Independent   Sitting balance-Leahy Scale: Good       Standing balance-Leahy Scale: Good                   Standardized Balance Assessment Standardized Balance Assessment : Dynamic Gait Index   Dynamic Gait Index Level Surface: Mild Impairment Change in Gait Speed: Mild Impairment Gait with Horizontal Head Turns: Normal Gait with Vertical Head Turns: Normal Gait and Pivot Turn: Normal Step Over Obstacle: Mild Impairment Step Around Obstacles: Normal Steps: Mild Impairment Total Score: 20       Pertinent Vitals/Pain Pain Assessment: 0-10 Pain Score: 4  Pain Location: R UE Pain Descriptors / Indicators: Aching Pain Intervention(s): Monitored during session    Home Living Family/patient expects to be discharged to:: Private residence Living Arrangements: Alone Available Help at Discharge: Family;Friend(s);Available 24 hours/day (family/friends coming to stay with pt) Type of Home: House Home Access: Stairs to enter Entrance Stairs-Rails: Left Entrance Stairs-Number of Steps: 7 Home Layout: One level Home Equipment: Walker - 2 wheels;Cane - single point;Bedside commode;Shower seat;Grab bars - tub/shower      Prior Function Level of Independence: Independent  Hand Dominance    Dominant Hand: Right    Extremity/Trunk Assessment   Upper Extremity Assessment: Overall WFL for tasks assessed;Defer to OT evaluation RUE Deficits / Details: R arm in short splint, sling used for comfort         Lower Extremity Assessment: Overall WFL for tasks assessed      Cervical / Trunk Assessment: Normal  Communication   Communication: No difficulties  Cognition Arousal/Alertness: Awake/alert Behavior During Therapy: WFL for tasks assessed/performed Overall Cognitive Status: Within Functional Limits for tasks assessed                      General Comments      Exercises        Assessment/Plan    PT Assessment Patent does not need any further PT services  PT Diagnosis Acute pain   PT Problem List    PT Treatment Interventions     PT Goals (Current goals can be found in the Care Plan section) Acute Rehab PT Goals Patient Stated Goal: return to being independent    Frequency     Barriers to discharge        Co-evaluation               End of Session Equipment Utilized During Treatment: Gait belt Activity Tolerance: Patient tolerated treatment well Patient left: in chair;with call bell/phone within reach Nurse Communication: Mobility status    Functional Assessment Tool Used: Clinical judgement Functional Limitation: Mobility: Walking and moving around Mobility: Walking and Moving Around Current Status [id number]): At least 1 percent but less than 20 percent impaired, limited or restricted Mobility: Walking and Moving Around Goal Status [phone]): At least 1 percent but less than 20 percent impaired, limited or restricted Mobility: Walking and Moving Around Discharge Status [phone]): At least 1 percent but less than 20 percent impaired, limited or restricted    Time: 1115-1150 PT Time Calculation (min) (ACUTE ONLY): 35 min   Charges:   PT Evaluation $PT Eval Low Complexity: 1 Procedure PT Treatments $Gait Training: 8-22 mins   PT G  Codes:   PT G-Codes **NOT FOR INPATIENT CLASS** Functional Assessment Tool Used: Clinical judgement Functional Limitation: Mobility: Walking and moving around Mobility: Walking and Moving Around Current Status [id number]): At least 1 percent but less than 20 percent impaired, limited or restricted Mobility: Walking and Moving Around Goal Status [phone]): At least 1 percent but less than 20 percent impaired, limited or restricted Mobility: Walking and Moving Around Discharge Status [phone]): At least 1 percent but less than 20 percent impaired, limited or restricted    Zenia Resides, Destaney Sarkis L 08/31/2015, 12:15 PM

## 2015-08-31 NOTE — Discharge Instructions (Signed)
Please keep your bandage clean and dry and elevated at all times. Please move your fingers frequently and massage them.  Please take vitamin D 1000 international units a day and 1200 mg of calcium a day as well as your vitamin C 1000 mg a day. I pain pills been provided for you to take for discomfort please use this as needed.   Keep bandage clean and dry.  Call for any problems.  No smoking.  Criteria for driving a car: you should be off your pain medicine for 7-8 hours, able to drive one handed(confident), thinking clearly and feeling able in your judgement to drive. Continue elevation as it will decrease swelling.  If instructed by MD move your fingers within the confines of the bandage/splint.  Use ice if instructed by your MD. Call immediately for any sudden loss of feeling in your hand/arm or change in functional abilities of the extremity.We recommend that you to take vitamin C 1000 mg a day to promote healing. We also recommend that if you require  pain medicine that you take a stool softener to prevent constipation as most pain medicines will have constipation side effects. We recommend either Peri-Colace or Senokot and recommend that you also consider adding MiraLAX as well to prevent the constipation affects from pain medicine if you are required to use them. These medicines are over the counter and may be purchased at a local pharmacy. A cup of yogurt and a probiotic can also be helpful during the recovery process as the medicines can disrupt your intestinal environment.

## 2015-08-31 NOTE — Discharge Summary (Signed)
  Patient has been seen and examined. Patient has pain appropriate to his injury/process. Patient denies new complaints at this present time. I have discussed the care pathway with nursing staff. Patient is appropriate and alert.  We reviewed vital signs and intake output which are stable.  The upper extremity is neurovascularly intact. Refill is normal. There is no signs of compartment syndrome. There is no signs of dystrophy. There is normal sensation.  I have spent a  great deal of time discussing range of motion edema control and other techniques to decrease edema and promote flexion extension of the fingers. Patient understands the importance of elevation range of motion massage and other measures to lessen pain and prevent swelling.  We have also discussed immobilization to appropriate areas involved.  We have discussed with the patient shoulder range of motion to prevent adhesive capsulitis.  The remainder of the examination is normal today without complicating feature.  Drain was removed without difficulty  Patient will be discharged home. Will plan to see the patient back in the office as per discharge instructions (please see discharge instructions).  Patient had an uneventful hospital course. At the time of discharge patient is stable awake alert and oriented in no acute distress. Regular diet will be continued and has been tolerated. Patient will notify should have problems occur. There is no signs of DVT infection or other complication at this juncture.  All questions have been incurred and answered.  Please see discharge med list Patient will be DC July 31.  Final diagnosis status post ORIF comminuted complex right wrist fracture  Please see DC meds.  Regular diet Hanzel Pizzo MD

## 2015-08-31 NOTE — Progress Notes (Signed)
Subjective: 1 Day Post-Op Procedure(s) (LRB): OPEN REDUCTION INTERNAL FIXATION (ORIF) RIGHT WRIST FRACTURE AND REPAIR AS NECESSARY (Right) Patient reports pain as moderate.    Objective: Vital signs in last 24 hours: Temp:  [97.4 F (36.3 C)-98.5 F (36.9 C)] 97.9 F (36.6 C) (07/30 0407) Pulse Rate:  [54-83] 76 (07/30 0407) Resp:  [11-19] 18 (07/30 0407) BP: (108-133)/(48-73) 108/48 (07/30 0407) SpO2:  [94 %-100 %] 94 % (07/30 0407)  Intake/Output from previous day: 07/29 0701 - 07/30 0700 In: 1680 [P.O.:980; I.V.:700] Out: 20 [Blood:20] Intake/Output this shift: Total I/O In: 240 [P.O.:240] Out: -   No results for input(s): HGB in the last 72 hours. No results for input(s): WBC, RBC, HCT, PLT in the last 72 hours. No results for input(s): NA, K, CL, CO2, BUN, CREATININE, GLUCOSE, CALCIUM in the last 72 hours. No results for input(s): LABPT, INR in the last 72 hours.  Neurologically intact ABD soft Neurovascular intact Sensation intact distally Intact pulses distally No cellulitis present Compartment soft  Assessment/Plan: 1 Day Post-Op Procedure(s) (LRB): OPEN REDUCTION INTERNAL FIXATION (ORIF) RIGHT WRIST FRACTURE AND REPAIR AS NECESSARY (Right) Advance diet Up with therapy Plan for discharge tomorrow  Paulene Floor 08/31/2015, 9:59 AM

## 2015-08-31 NOTE — Progress Notes (Signed)
Occupational Therapy Treatment Patient Details Name: Courtney Willis MRN: [id number] DOB: [birth year] Today's Date: 08/31/2015    History of present illness Pt is a 81 y.o. female s/p OPEN REDUCTION INTERNAL FIXATION (ORIF) RIGHT WRIST FRACTURE AND REPAIR AS NECESSARY. PMHx: Arithrits, Cancer, Hypercholesteremia.    OT comments  Pt. Making gains with skilled OT. UB/LB B/D this session.  Will focus on tub transfer for next session.    Follow Up Recommendations  Supervision/Assistance - 24 hour;Other (comment)    Equipment Recommendations  None recommended by OT    Recommendations for Other Services      Precautions / Restrictions Precautions Precautions: Fall Restrictions RUE Weight Bearing: Non weight bearing       Mobility Bed Mobility Overal bed mobility: Modified Independent       Supine to sit: Modified independent (Device/Increase time)     General bed mobility comments: able to transition into sitting while managing RUE. no physical assistance required  Transfers Overall transfer level: Needs assistance Equipment used: None Transfers: Sit to/from [facility] Sit to Stand: Supervision Stand pivot transfers: Supervision            Balance                                   ADL Overall ADL's : Needs assistance/impaired     Grooming: Minimal assistance;Standing;Wash/dry hands;Wash/dry face;Oral care;Applying deodorant;Brushing hair Grooming Details (indicate cue type and reason): pt. reports she has a difficult time with using L hand for gross motor grasp, previous fx and "did not get full return" Upper Body Bathing: Minimal assitance;Standing   Lower Body Bathing: Minimal assistance;Sit to/from stand   Upper Body Dressing : Moderate assistance;Sitting       Toilet Transfer: Supervision/safety;Ambulation;Regular Toilet   Toileting- Clothing Manipulation and Hygiene: Supervision/safety;Sitting/lateral lean        Functional mobility during ADLs: Minimal assistance General ADL Comments: moving well, states she is limited with use of L hand for tasks. reports multiple granddaughters assist daily with bathing and other self care tasks      Vision                     Perception     Praxis      Cognition   Behavior During Therapy: [facility] for tasks assessed/performed Overall Cognitive Status: Within Functional Limits for tasks assessed                       Extremity/Trunk Assessment               Exercises     Shoulder Instructions       General Comments      Pertinent Vitals/ Pain       Pain Assessment: 0-10 Pain Score: 4  Pain Location: R UE Pain Descriptors / Indicators: Aching Pain Intervention(s): RN gave pain meds during session;Patient requesting pain meds-RN notified  Home Living                                          Prior Functioning/Environment              Frequency Min 2X/week     Progress Toward Goals  OT Goals(current goals can now be found in the care plan section)  Progress  towards OT goals: Progressing toward goals     Plan Discharge plan remains appropriate    Co-evaluation                 End of Session     Activity Tolerance Patient tolerated treatment well   Patient Left in chair;with call bell/phone within reach;with nursing/sitter in room (nurse present giving meds at end of session)   Nurse Communication Patient requests pain meds        Time: [id number] OT Time Calculation (min): 26 min  Charges: OT General Charges $OT Visit: 1 Procedure OT Treatments $Self Care/Home Management : 23-37 mins  Janice Coffin, COTA/L 08/31/2015, 10:20 AM

## 2015-09-01 ENCOUNTER — Encounter (HOSPITAL_COMMUNITY): Payer: Self-pay | Admitting: Orthopedic Surgery

## 2015-09-01 DIAGNOSIS — S52571A Other intraarticular fracture of lower end of right radius, initial encounter for closed fracture: Secondary | ICD-10-CM | POA: Diagnosis not present

## 2015-09-01 NOTE — Progress Notes (Signed)
Pt ready for discharge. Education/instructions reviewed with pt and all questions/concerns addressed. Belongings gathered and pt will be transported out via wheelchair to son's car. Will continue to monitor

## 2015-09-01 NOTE — Care Management Obs Status (Signed)
MEDICARE OBSERVATION STATUS NOTIFICATION   Patient Details  Name: OFA ISSA MRN: [id number] Date of Birth: [birth year]   Medicare Observation Status Notification Given:  Yes    Ninfa Meeker, RN 09/01/2015, 11:45 AM

## 2015-09-01 NOTE — Progress Notes (Signed)
Patient ID: Courtney Willis, female   DOB: [birth year], 81 y.o.   MRN: [id number] Patient is seen and evaluated this morning. We discussed all issues with the patient once again this morning in regards to her discharge. She is stable, the upper extremity is neurovascularly intact and no signs of infection are present. We will proceed with discharge. All questions were encouraged and answered please see discharge summary.

## 2015-09-01 NOTE — Progress Notes (Signed)
Occupational Therapy Treatment Patient Details Name: Courtney Willis MRN: [id number] DOB: 22-Mar-1934 Today's Date: 09/01/2015    History of present illness Pt is a 81 y.o. female s/p OPEN REDUCTION INTERNAL FIXATION (ORIF) RIGHT WRIST FRACTURE AND REPAIR AS NECESSARY. PMHx: Arithrits, Cancer, Hypercholesteremia.    OT comments  Pt. Performing grooming and toileting tasks at MOD I level. Tub transfer with S.  Clear for d/c from acute OT with d/c home later today.  Will alert OTR/L to sign off.  Pt. Reports she has all DME.    Follow Up Recommendations  Supervision/Assistance - 24 hour;Other (comment)    Equipment Recommendations  None recommended by OT    Recommendations for Other Services      Precautions / Restrictions Precautions Precautions: Fall Restrictions RUE Weight Bearing: Non weight bearing       Mobility Bed Mobility Overal bed mobility: Modified Independent       Supine to sit: Modified independent (Device/Increase time)     General bed mobility comments: able to transition into sitting while managing RUE. no physical assistance required (HOB flat, no rails, exits on L side)  Transfers Overall transfer level: Modified independent Equipment used: None Transfers: Sit to/from [facility] Sit to Stand: Modified independent (Device/Increase time) Stand pivot transfers: Modified independent (Device/Increase time)            Balance                                   ADL Overall ADL's : Needs assistance/impaired     Grooming: Wash/dry hands;Modified independent;Standing                   Toilet Transfer: Modified Careers adviser;Ambulation   Toileting- Clothing Manipulation and Hygiene: Modified independent;Sitting/lateral lean   Tub/ Shower Transfer: Tub transfer;Supervision/safety;Ambulation;Shower seat;Grab bars   Functional mobility during ADLs: Modified independent General ADL Comments: pt. set for  d/c home later today.  able to compete tub transfer and reports having grab bars, hand held shower head, and shower seat      Vision                     Perception     Praxis      Cognition   Behavior During Therapy: [facility] for tasks assessed/performed Overall Cognitive Status: Within Functional Limits for tasks assessed                       Extremity/Trunk Assessment               Exercises     Shoulder Instructions       General Comments      Pertinent Vitals/ Pain       Pain Assessment: No/denies pain  Home Living                                          Prior Functioning/Environment              Frequency Min 2X/week     Progress Toward Goals  OT Goals(current goals can now be found in the care plan section)  Progress towards OT goals: Goals met/education completed, patient discharged from [place] Discharge plan remains appropriate    Co-evaluation  End of Session     Activity Tolerance Patient tolerated treatment well   Patient Left Other (comment) (left in b.room completing grooming tasks)   Nurse Communication Other (comment) (notified CNA of pts. request for assistance with b/d )        Time: 2956-2130 OT Time Calculation (min): 23 min  Charges: OT General Charges $OT Visit: 1 Procedure OT Treatments $Self Care/Home Management : 23-37 mins  Janice Coffin, COTA/L 09/01/2015, 8:23 AM

## 2015-09-04 ENCOUNTER — Ambulatory Visit: Payer: Medicare PPO

## 2015-10-14 ENCOUNTER — Ambulatory Visit: Payer: Medicare Other

## 2015-10-15 ENCOUNTER — Ambulatory Visit
Admission: RE | Admit: 2015-10-15 | Discharge: 2015-10-15 | Disposition: A | Discharge status: Home / Self Care | Payer: Medicare Other | Source: Ambulatory Visit | Attending: Family Medicine | Admitting: Family Medicine

## 2015-10-15 ENCOUNTER — Other Ambulatory Visit: Payer: Self-pay | Admitting: Family Medicine

## 2015-10-15 DIAGNOSIS — Z1231 Encounter for screening mammogram for malignant neoplasm of breast: Secondary | ICD-10-CM

## 2015-12-11 ENCOUNTER — Ambulatory Visit (INDEPENDENT_AMBULATORY_CARE_PROVIDER_SITE_OTHER): Payer: Medicare Other | Admitting: Physician Assistant

## 2015-12-11 ENCOUNTER — Encounter: Payer: Self-pay | Admitting: Physician Assistant

## 2015-12-11 VITALS — BP 138/70 | HR 76 | Temp 98.7°F | Resp 16 | Wt 135.0 lb

## 2015-12-11 DIAGNOSIS — R05 Cough: Secondary | ICD-10-CM | POA: Diagnosis not present

## 2015-12-11 DIAGNOSIS — R059 Cough, unspecified: Secondary | ICD-10-CM

## 2015-12-11 DIAGNOSIS — B9789 Other viral agents as the cause of diseases classified elsewhere: Secondary | ICD-10-CM

## 2015-12-11 DIAGNOSIS — J069 Acute upper respiratory infection, unspecified: Secondary | ICD-10-CM | POA: Diagnosis not present

## 2015-12-11 MED ORDER — BENZONATATE 100 MG PO CAPS: 100.0000 mg | ORAL_CAPSULE | Freq: Three times a day (TID) | ORAL | 0 refills | Status: AC | PRN

## 2015-12-11 NOTE — Progress Notes (Signed)
[facility] [place]  Chief Complaint  Patient presents with  . Sinusitis    Subjective:    Patient ID: Courtney Willis, female    DOB: 1934/08/25, 81 y.o.   MRN: [id number]  Upper Respiratory Infection: Courtney Willis is a 81 y.o. female with a past medical history significant for Seasonal Allergies complaining of symptoms of a URI, possible sinusitis. Symptoms include congestion, cough, sore throat and swollen glands. Onset of symptoms was 7 days ago, unchanged since that time. She also c/o congestion, cough described as productive, facial pain, nasal congestion and non productive cough for the past 7 days .  She is drinking plenty of fluids. Evaluation to date: none. Treatment to date: antihistamines. The treatment has provided minimal. No fever, no chills, no nausea/vomiting. No myalgias. No smoking.  Review of Systems  Constitutional: Positive for fatigue. Negative for activity change, appetite change, chills, diaphoresis, fever and unexpected weight change.  HENT: Positive for congestion, postnasal drip, rhinorrhea, sinus pain, sinus pressure, sneezing, sore throat and voice change. Negative for ear discharge, ear pain, hearing loss, nosebleeds and tinnitus.   Respiratory: Positive for cough, chest tightness and shortness of breath. Negative for apnea, choking, wheezing and stridor.   Gastrointestinal: Negative.   Neurological: Negative for dizziness, light-headedness and headaches.       Objective:   BP 138/70 (BP Location: Right Arm, Patient Position: Sitting, Cuff Size: Normal)   Pulse 76   Temp 98.7 F (37.1 C) (Oral)   Resp 16   Wt 135 lb (61.2 kg)   BMI 21.14 kg/m   Patient Active Problem List   Diagnosis Date Noted  . Distal radius fracture, right 08/30/2015  . Elevated liver enzymes 08/12/2014  . Bunion 08/12/2014  . Cataract 08/12/2014  . CN (constipation) 08/12/2014  . Edema of foot 08/12/2014  . Fibroids, submucosal 08/12/2014  .  Glaucoma 08/12/2014  . Hypercholesteremia 08/12/2014  . Allergic state 08/12/2014  . Coitalgia 08/12/2014  . 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 Avitaminosis D 08/12/2014  . Post menopausal syndrome 08/12/2014  . Use of proton pump inhibitor therapy 08/06/2014  . Alcohol use 08/06/2014  . Acid reflux 02/26/2008  . DD (diverticular disease) 02/16/2008  . Bowel disease 02/16/2008    Outpatient Encounter Prescriptions as of 12/11/2015  Medication Sig Note  . ascorbic acid (VITAMIN C) 500 MG tablet Take 1 tablet by mouth daily.   . chlorpheniramine (ALLERGY) 4 MG tablet Take 4 mg by mouth 2 (two) times daily as needed for allergies.   . Cholecalciferol (VITAMIN D3) 1000 UNITS CAPS Take 1 capsule by mouth daily.   . fluticasone (FLONASE) 50 MCG/ACT nasal spray Place 2 sprays into the nose daily.   . pantoprazole (PROTONIX) 40 MG tablet TAKE 1 TABLET (40 MG TOTAL) BY MOUTH DAILY.   Marland Kitchen polyethylene glycol powder (GLYCOLAX/MIRALAX) powder Take 1 Container by mouth daily.    . polyvinyl alcohol-povidone (REFRESH) 1.4-0.6 % ophthalmic solution Apply 1 drop to eye daily as needed (dry eyes).    . timolol (TIMOPTIC) 0.5 % ophthalmic solution Place 1 drop into both eyes at bedtime.   . benzonatate (TESSALON) 100 MG capsule Take 1 capsule (100 mg total) by mouth 3 (three) times daily as needed for cough.   . [DISCONTINUED] ondansetron (ZOFRAN) 8 MG tablet Take 8 mg by mouth every 8 (eight) hours as needed for nausea or vomiting. 08/28/2015: Has not started  . [DISCONTINUED] oxyCODONE-acetaminophen (ROXICET) 5-325 MG tablet  Take 1-2 tablets by mouth every 4 (four) hours as needed for severe pain.    No facility-administered encounter medications on file as of 12/11/2015.     No Known Allergies     Physical Exam  Constitutional: She appears well-developed and well-nourished.  HENT:  Right Ear: External ear normal.  Left Ear: External ear normal.  Nose: Right sinus exhibits  no maxillary sinus tenderness and no frontal sinus tenderness. Left sinus exhibits no maxillary sinus tenderness and no frontal sinus tenderness.  Mouth/Throat: Oropharynx is clear and moist. No oropharyngeal exudate.  Eyes: Right eye exhibits discharge. Left eye exhibits discharge.  Neck: Neck supple.  Cardiovascular: Normal rate and regular rhythm.   Pulmonary/Chest: Effort normal and breath sounds normal. No respiratory distress. She has no wheezes. She has no rales.  Lymphadenopathy:    She has cervical adenopathy.  Skin: Skin is warm and dry.  Psychiatric: She has a normal mood and affect. Her behavior is normal.       Assessment & Plan:   Problem List Items Addressed This Visit    None    Visit Diagnoses    Cough    -  Primary   Relevant Medications   benzonatate (TESSALON) 100 MG capsule   Viral upper respiratory illness          Antibiotics are not appropriate at this time. Counseled patient on length of viral symptoms and duration of cough afterwards, one to two weeks afterwards. Prescribed cough medication for symptomatic treatment.   Recommend rest, fluids, frequent hand washing. Work note provided  Return if symptoms worsen or fail to improve.  The entirety of the information documented in the History of Present Illness, Review of Systems and Physical Exam were personally obtained by me. Portions of this information were initially documented by Ashley Royalty, CMA and reviewed by me for thoroughness and accuracy.    Patient Instructions  Upper Respiratory Infection, Adult Most upper respiratory infections (URIs) are a viral infection of the air passages leading to the lungs. A URI affects the nose, throat, and upper air passages. The most common type of URI is nasopharyngitis and is typically referred to as "the common cold." URIs run their course and usually go away on their own. Most of the time, a URI does not require medical attention, but sometimes a bacterial  infection in the upper airways can follow a viral infection. This is called a secondary infection. Sinus and middle ear infections are common types of secondary upper respiratory infections. Bacterial pneumonia can also complicate a URI. A URI can worsen asthma and chronic obstructive pulmonary disease (COPD). Sometimes, these complications can require emergency medical care and may be life threatening.  CAUSES Almost all URIs are caused by viruses. A virus is a type of germ and can spread from one person to another.  RISKS FACTORS You may be at risk for a URI if:   You smoke.   You have chronic heart or lung disease.  You have a weakened defense (immune) system.   You are very young or very old.   You have nasal allergies or asthma.  You work in crowded or poorly ventilated areas.  You work in health care facilities or schools. SIGNS AND SYMPTOMS  Symptoms typically develop 2-3 days after you come in contact with a cold virus. Most viral URIs last 7-10 days. However, viral URIs from the influenza virus (flu virus) can last 14-18 days and are typically more severe. Symptoms may include:  Runny or stuffy (congested) nose.   Sneezing.   Cough.   Sore throat.   Headache.   Fatigue.   Fever.   Loss of appetite.   Pain in your forehead, behind your eyes, and over your cheekbones (sinus pain).  Muscle aches.  DIAGNOSIS  Your health care provider may diagnose a URI by:  Physical exam.  Tests to check that your symptoms are not due to another condition such as:  Strep throat.  Sinusitis.  Pneumonia.  Asthma. TREATMENT  A URI goes away on its own with time. It cannot be cured with medicines, but medicines may be prescribed or recommended to relieve symptoms. Medicines may help:  Reduce your fever.  Reduce your cough.  Relieve nasal congestion. HOME CARE INSTRUCTIONS   Take medicines only as directed by your health care provider.   Gargle warm  saltwater or take cough drops to comfort your throat as directed by your health care provider.  Use a warm mist humidifier or inhale steam from a shower to increase air moisture. This may make it easier to breathe.  Drink enough fluid to keep your urine clear or pale yellow.   Eat soups and other clear broths and maintain good nutrition.   Rest as needed.   Return to work when your temperature has returned to normal or as your health care provider advises. You may need to stay home longer to avoid infecting others. You can also use a face mask and careful hand washing to prevent spread of the virus.  Increase the usage of your inhaler if you have asthma.   Do not use any tobacco products, including cigarettes, chewing tobacco, or electronic cigarettes. If you need help quitting, ask your health care provider. PREVENTION  The best way to protect yourself from getting a cold is to practice good hygiene.   Avoid oral or hand contact with people with cold symptoms.   Wash your hands often if contact occurs.  There is no clear evidence that vitamin C, vitamin E, echinacea, or exercise reduces the chance of developing a cold. However, it is always recommended to get plenty of rest, exercise, and practice good nutrition.  SEEK MEDICAL CARE IF:   You are getting worse rather than better.   Your symptoms are not controlled by medicine.   You have chills.  You have worsening shortness of breath.  You have brown or red mucus.  You have yellow or brown nasal discharge.  You have pain in your face, especially when you bend forward.  You have a fever.  You have swollen neck glands.  You have pain while swallowing.  You have white areas in the back of your throat. SEEK IMMEDIATE MEDICAL CARE IF:   You have severe or persistent:  Headache.  Ear pain.  Sinus pain.  Chest pain.  You have chronic lung disease and any of the following:  Wheezing.  Prolonged  cough.  Coughing up blood.  A change in your usual mucus.  You have a stiff neck.  You have changes in your:  Vision.  Hearing.  Thinking.  Mood. MAKE SURE YOU:   Understand these instructions.  Will watch your condition.  Will get help right away if you are not doing well or get worse.   This information is not intended to replace advice given to you by your health care provider. Make sure you discuss any questions you have with your health care provider.   Document Released: 07/14/2000 Document Revised:  06/04/2014 Document Reviewed: 04/25/2013 Elsevier Interactive Patient Education [employer].     The entirety of the information documented in the History of Present Illness, Review of Systems and Physical Exam were personally obtained by me. Portions of this information were initially documented by Ashley Royalty, CMA and reviewed by me for thoroughness and accuracy.

## 2015-12-11 NOTE — Patient Instructions (Signed)
Upper Respiratory Infection, Adult Most upper respiratory infections (URIs) are a viral infection of the air passages leading to the lungs. A URI affects the nose, throat, and upper air passages. The most common type of URI is nasopharyngitis and is typically referred to as "the common cold." URIs run their course and usually go away on their own. Most of the time, a URI does not require medical attention, but sometimes a bacterial infection in the upper airways can follow a viral infection. This is called a secondary infection. Sinus and middle ear infections are common types of secondary upper respiratory infections. Bacterial pneumonia can also complicate a URI. A URI can worsen asthma and chronic obstructive pulmonary disease (COPD). Sometimes, these complications can require emergency medical care and may be life threatening.  CAUSES Almost all URIs are caused by viruses. A virus is a type of germ and can spread from one person to another.  RISKS FACTORS You may be at risk for a URI if:   You smoke.   You have chronic heart or lung disease.  You have a weakened defense (immune) system.   You are very young or very old.   You have nasal allergies or asthma.  You work in crowded or poorly ventilated areas.  You work in health care facilities or schools. SIGNS AND SYMPTOMS  Symptoms typically develop 2-3 days after you come in contact with a cold virus. Most viral URIs last 7-10 days. However, viral URIs from the influenza virus (flu virus) can last 14-18 days and are typically more severe. Symptoms may include:   Runny or stuffy (congested) nose.   Sneezing.   Cough.   Sore throat.   Headache.   Fatigue.   Fever.   Loss of appetite.   Pain in your forehead, behind your eyes, and over your cheekbones (sinus pain).  Muscle aches.  DIAGNOSIS  Your health care provider may diagnose a URI by:  Physical exam.  Tests to check that your symptoms are not due to  another condition such as:  Strep throat.  Sinusitis.  Pneumonia.  Asthma. TREATMENT  A URI goes away on its own with time. It cannot be cured with medicines, but medicines may be prescribed or recommended to relieve symptoms. Medicines may help:  Reduce your fever.  Reduce your cough.  Relieve nasal congestion. HOME CARE INSTRUCTIONS   Take medicines only as directed by your health care provider.   Gargle warm saltwater or take cough drops to comfort your throat as directed by your health care provider.  Use a warm mist humidifier or inhale steam from a shower to increase air moisture. This may make it easier to breathe.  Drink enough fluid to keep your urine clear or pale yellow.   Eat soups and other clear broths and maintain good nutrition.   Rest as needed.   Return to work when your temperature has returned to normal or as your health care provider advises. You may need to stay home longer to avoid infecting others. You can also use a face mask and careful hand washing to prevent spread of the virus.  Increase the usage of your inhaler if you have asthma.   Do not use any tobacco products, including cigarettes, chewing tobacco, or electronic cigarettes. If you need help quitting, ask your health care provider. PREVENTION  The best way to protect yourself from getting a cold is to practice good hygiene.   Avoid oral or hand contact with people with cold   symptoms.   Wash your hands often if contact occurs.  There is no clear evidence that vitamin C, vitamin E, echinacea, or exercise reduces the chance of developing a cold. However, it is always recommended to get plenty of rest, exercise, and practice good nutrition.  SEEK MEDICAL CARE IF:   You are getting worse rather than better.   Your symptoms are not controlled by medicine.   You have chills.  You have worsening shortness of breath.  You have brown or red mucus.  You have yellow or brown nasal  discharge.  You have pain in your face, especially when you bend forward.  You have a fever.  You have swollen neck glands.  You have pain while swallowing.  You have white areas in the back of your throat. SEEK IMMEDIATE MEDICAL CARE IF:   You have severe or persistent:  Headache.  Ear pain.  Sinus pain.  Chest pain.  You have chronic lung disease and any of the following:  Wheezing.  Prolonged cough.  Coughing up blood.  A change in your usual mucus.  You have a stiff neck.  You have changes in your:  Vision.  Hearing.  Thinking.  Mood. MAKE SURE YOU:   Understand these instructions.  Will watch your condition.  Will get help right away if you are not doing well or get worse.   This information is not intended to replace advice given to you by your health care provider. Make sure you discuss any questions you have with your health care provider.   Document Released: 07/14/2000 Document Revised: 06/04/2014 Document Reviewed: 04/25/2013 Elsevier Interactive Patient Education 2016 [employer]  

## 2016-06-02 ENCOUNTER — Telehealth: Payer: Self-pay | Admitting: Physician Assistant

## 2016-06-02 NOTE — Telephone Encounter (Signed)
Called Pt to schedule AWV with [facility] - knb °

## 2016-06-29 ENCOUNTER — Ambulatory Visit: Payer: Medicare Other

## 2016-07-01 ENCOUNTER — Ambulatory Visit (INDEPENDENT_AMBULATORY_CARE_PROVIDER_SITE_OTHER): Payer: Medicare Other

## 2016-07-01 VITALS — BP 140/68 | HR 76 | Temp 99.5°F | Ht 67.0 in | Wt 129.6 lb

## 2016-07-01 DIAGNOSIS — Z0000 Encounter for general adult medical examination without abnormal findings: Secondary | ICD-10-CM

## 2016-07-01 NOTE — Progress Notes (Signed)
Subjective:   Courtney Willis is a 82 y.o. female who presents for Medicare Annual (Subsequent) preventive examination.  Review of Systems:  N/A  Cardiac Risk Factors include: advanced age (>67men, >2 women);dyslipidemia;sedentary lifestyle     Objective:     Vitals: BP 140/68 (BP Location: Right Arm)   Pulse 76   Temp 99.5 F (37.5 C) (Oral)   Ht 5\' 7"  (1.702 m)   Wt 129 lb 9.6 oz (58.8 kg)   BMI 20.30 kg/m   Body mass index is 20.3 kg/m.   Tobacco History  Smoking Status  . Former Smoker  Smokeless Tobacco  . Never Used    Comment: (08/29/15) quit 40 years ago     Counseling given: Not Answered   Past Medical History:  Diagnosis Date  . Acid reflux 02/26/2008  . Arthritis   . 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 Avitaminosis D 08/12/2014  . Bowel disease 02/16/2008  . Bunion 08/12/2014  . Cancer [facility])    Skin cancer- basal- upper arm , upper chest  . CN (constipation) 08/12/2014  . DD (diverticular disease) 02/16/2008  . Elevated liver enzymes 08/12/2014  . Fibroids, submucosal 08/12/2014   of her lower lip   . Hypercholesteremia 08/12/2014  . 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 Post menopausal syndrome 08/12/2014   Past Surgical History:  Procedure Laterality Date  . BUNIONECTOMY     05/2004, 2007  . CATARACT EXTRACTION     04/2005, 06/2005  . COLONOSCOPY W/ POLYPECTOMY    . HYSTEROSCOPY  2011  . ORIF WRIST FRACTURE Right 08/30/2015   Procedure: OPEN REDUCTION INTERNAL FIXATION (ORIF) RIGHT WRIST FRACTURE AND REPAIR AS NECESSARY;  Surgeon: Roseanne Kaufman, MD;  Location: [facility];  Service: Orthopedics;  Laterality: Right;  . UPPER GI ENDOSCOPY    . WRIST FRACTURE SURGERY Left    06/2006   Family History  Problem Relation Age of Onset  . Rheum arthritis Mother   . Lung cancer Father   . Ulcers Father   . Hodgkin's lymphoma Sister   . AVM Daughter   . Breast cancer Paternal Grandmother    History  Sexual Activity  . Sexual activity: Not on file    Outpatient  Encounter Prescriptions as of 07/01/2016  Medication Sig  . ascorbic acid (VITAMIN C) 500 MG tablet Take 1 tablet by mouth daily.  . chlorpheniramine (ALLERGY) 4 MG tablet Take 4 mg by mouth 2 (two) times daily as needed for allergies.  . Cholecalciferol (VITAMIN D3) 1000 UNITS CAPS Take 1 capsule by mouth daily.  . fluticasone (FLONASE) 50 MCG/ACT nasal spray Place 2 sprays into the nose daily.  . polyethylene glycol powder (GLYCOLAX/MIRALAX) powder Take 1 Container by mouth daily.   Marland Kitchen Propylene Glycol (SYSTANE BALANCE) 0.6 % SOLN Apply 1 drop to eye as needed.  . timolol (TIMOPTIC) 0.5 % ophthalmic solution Place 1 drop into both eyes at bedtime.  . vitamin B-12 (CYANOCOBALAMIN) 500 MCG tablet Take 500 mcg by mouth daily.  . polyvinyl alcohol-povidone (REFRESH) 1.4-0.6 % ophthalmic solution Apply 1 drop to eye daily as needed (dry eyes).   . [DISCONTINUED] pantoprazole (PROTONIX) 40 MG tablet TAKE 1 TABLET (40 MG TOTAL) BY MOUTH DAILY. (Patient not taking: Reported on 07/01/2016)   No facility-administered encounter medications on file as of 07/01/2016.     Activities of Daily Living In your present state of health, do you have any difficulty performing the following activities: 07/01/2016 08/30/2015  Hearing? N N  Vision? N N  Difficulty concentrating or making decisions? N N  Walking or climbing stairs? N Y  Dressing or bathing? N Y  Doing errands, shopping? N -  Preparing Food and eating ? N -  Using the Toilet? N -  In the past six months, have you accidently leaked urine? Y -  Do you have problems with loss of bowel control? N -  Managing your Medications? N -  Managing your Finances? N -  Housekeeping or managing your Housekeeping? N -  Some recent data might be hidden    Patient Care Team: Rubye Beach as PCP - General (Family Medicine) Dingeldein, Remo Lipps, MD as Consulting Physician (Ophthalmology)    Assessment:     Exercise Activities and Dietary  recommendations Current Exercise Habits: The patient does not participate in regular exercise at present (walk a lot), Exercise limited by: orthopedic condition(s);Other - see comments (still healing from wrist surgery)  Goals    . Increase water intake          Recommend to continue current healthy diet and daily water intake of 6-8 glasses.        Fall Risk Fall Risk  07/01/2016 07/08/2015  Falls in the past year? Yes Yes  Number falls in past yr: 1 -  Injury with Fall? Yes No  Follow up Falls prevention discussed -   Depression Screen PHQ 2/9 Scores 07/01/2016 07/01/2016 07/08/2015  PHQ - 2 Score 0 0 0  PHQ- 9 Score 0 - -     Cognitive Function     6CIT Screen 07/01/2016  What Year? 0 points  What month? 0 points  What time? 0 points  Count back from 20 0 points  Months in reverse 0 points  Repeat phrase 0 points  Total Score 0    Immunization History  Administered Date(s) Administered  . Pneumococcal Conjugate-13 07/02/2014  . Pneumococcal Polysaccharide-23 07/01/2004, 11/05/2004  . Td 01/14/2003, 04/08/2010  . Tdap 04/08/2010, 08/26/2015  . Zoster 03/11/2005   Screening Tests Health Maintenance  Topic Date Due  . INFLUENZA VACCINE  09/01/2016  . TETANUS/TDAP  08/25/2025  . DEXA SCAN  Completed  . PNA vac Low Risk Adult  Completed      Plan:  I have personally reviewed and addressed the Medicare Annual Wellness questionnaire and have noted the following in the patient's chart:  A. Medical and social history B. Use of alcohol, tobacco or illicit drugs  C. Current medications and supplements D. Functional ability and status E.  Nutritional status F.  Physical activity G. Advance directives H. List of other physicians I.  Hospitalizations, surgeries, and ER visits in previous 12 months J.  [place] such as hearing and vision if needed, cognitive and depression L. Referrals and appointments - none  In addition, I have reviewed and discussed with  patient certain preventive protocols, quality metrics, and best practice recommendations. A written personalized care plan for preventive services as well as general preventive health recommendations were provided to patient.  See attached scanned questionnaire for additional information.   Signed,  Fabio Neighbors, LPN Nurse Health Advisor   MD Recommendations: None.

## 2016-07-01 NOTE — Patient Instructions (Signed)
Courtney Willis , Thank you for taking time to come for your Medicare Wellness Visit. I appreciate your ongoing commitment to your health goals. Please review the following plan we discussed and let me know if I can assist you in the future.   Screening recommendations/referrals: Colonoscopy: completed 09/27/12 Mammogram: completed 10/16/15, due 10/2016 Bone Density: completed 07/17/13 Recommended yearly ophthalmology/optometry visit for glaucoma screening and checkup Recommended yearly dental visit for hygiene and checkup  Vaccinations: Influenza vaccine: due 10/2016 Pneumococcal vaccine: completed series Tdap vaccine: completed 08/26/15, due 08/2025 Shingles vaccine: completed 03/11/05  Advanced directives: Copy on file  Conditions/risks identified: Fall risk prevention; Recommend to continue current healthy diet and daily water intake of 6-8 glasses.    Next appointment: 07/19/16 @ 10 AM   Preventive Care 65 Years and Older, Female Preventive care refers to lifestyle choices and visits with your health care provider that can promote health and wellness. What does preventive care include?  A yearly physical exam. This is also called an annual well check.  Dental exams once or twice a year.  Routine eye exams. Ask your health care provider how often you should have your eyes checked.  Personal lifestyle choices, including:  Daily care of your teeth and gums.  Regular physical activity.  Eating a healthy diet.  Avoiding tobacco and drug use.  Limiting alcohol use.  Practicing safe sex.  Taking low-dose aspirin every day.  Taking vitamin and mineral supplements as recommended by your health care provider. What happens during an annual well check? The services and screenings done by your health care provider during your annual well check will depend on your age, overall health, lifestyle risk factors, and family history of disease. Counseling  Your health care provider may  ask you questions about your:  Alcohol use.  Tobacco use.  Drug use.  Emotional well-being.  Home and relationship well-being.  Sexual activity.  Eating habits.  History of falls.  Memory and ability to understand (cognition).  Work and work Statistician.  Reproductive health. Screening  You may have the following tests or measurements:  Height, weight, and BMI.  Blood pressure.  Lipid and cholesterol levels. These may be checked every 5 years, or more frequently if you are over 24 years old.  Skin check.  Lung cancer screening. You may have this screening every year starting at age 61 if you have a 30-pack-year history of smoking and currently smoke or have quit within the past 15 years.  Fecal occult blood test (FOBT) of the stool. You may have this test every year starting at age 26.  Flexible sigmoidoscopy or colonoscopy. You may have a sigmoidoscopy every 5 years or a colonoscopy every 10 years starting at age 21.  Hepatitis C blood test.  Hepatitis B blood test.  Sexually transmitted disease (STD) testing.  Diabetes screening. This is done by checking your blood sugar (glucose) after you have not eaten for a while (fasting). You may have this done every 1-3 years.  Bone density scan. This is done to screen for osteoporosis. You may have this done starting at age 65.  Mammogram. This may be done every 1-2 years. Talk to your health care provider about how often you should have regular mammograms. Talk with your health care provider about your test results, treatment options, and if necessary, the need for more tests. Vaccines  Your health care provider may recommend certain vaccines, such as:  Influenza vaccine. This is recommended every year.  Tetanus, diphtheria, and  acellular pertussis (Tdap, Td) vaccine. You may need a Td booster every 10 years.  Zoster vaccine. You may need this after age 5.  Pneumococcal 13-valent conjugate (PCV13) vaccine. One  dose is recommended after age 38.  Pneumococcal polysaccharide (PPSV23) vaccine. One dose is recommended after age 49. Talk to your health care provider about which screenings and vaccines you need and how often you need them. This information is not intended to replace advice given to you by your health care provider. Make sure you discuss any questions you have with your health care provider. Document Released: 02/14/2015 Document Revised: 10/08/2015 Document Reviewed: 11/19/2014 Elsevier Interactive Patient Education  2017 [place] Prevention in the Home Falls can cause injuries. They can happen to people of all ages. There are many things you can do to make your home safe and to help prevent falls. What can I do on the outside of my home?  Regularly fix the edges of walkways and driveways and fix any cracks.  Remove anything that might make you trip as you walk through a door, such as a raised step or threshold.  Trim any bushes or trees on the path to your home.  Use bright outdoor lighting.  Clear any walking paths of anything that might make someone trip, such as rocks or tools.  Regularly check to see if handrails are loose or broken. Make sure that both sides of any steps have handrails.  Any raised decks and porches should have guardrails on the edges.  Have any leaves, snow, or ice cleared regularly.  Use sand or salt on walking paths during winter.  Clean up any spills in your garage right away. This includes oil or grease spills. What can I do in the bathroom?  Use night lights.  Install grab bars by the toilet and in the tub and shower. Do not use towel bars as grab bars.  Use non-skid mats or decals in the tub or shower.  If you need to sit down in the shower, use a plastic, non-slip stool.  Keep the floor dry. Clean up any water that spills on the floor as soon as it happens.  Remove soap buildup in the tub or shower regularly.  Attach bath  mats securely with double-sided non-slip rug tape.  Do not have throw rugs and other things on the floor that can make you trip. What can I do in the bedroom?  Use night lights.  Make sure that you have a light by your bed that is easy to reach.  Do not use any sheets or blankets that are too big for your bed. They should not hang down onto the floor.  Have a firm chair that has side arms. You can use this for support while you get dressed.  Do not have throw rugs and other things on the floor that can make you trip. What can I do in the kitchen?  Clean up any spills right away.  Avoid walking on wet floors.  Keep items that you use a lot in easy-to-reach places.  If you need to reach something above you, use a strong step stool that has a grab bar.  Keep electrical cords out of the way.  Do not use floor polish or wax that makes floors slippery. If you must use wax, use non-skid floor wax.  Do not have throw rugs and other things on the floor that can make you trip. What can I do with my stairs?  Do not leave any items on the stairs.  Make sure that there are handrails on both sides of the stairs and use them. Fix handrails that are broken or loose. Make sure that handrails are as long as the stairways.  Check any carpeting to make sure that it is firmly attached to the stairs. Fix any carpet that is loose or worn.  Avoid having throw rugs at the top or bottom of the stairs. If you do have throw rugs, attach them to the floor with carpet tape.  Make sure that you have a light switch at the top of the stairs and the bottom of the stairs. If you do not have them, ask someone to add them for you. What else can I do to help prevent falls?  Wear shoes that:  Do not have high heels.  Have rubber bottoms.  Are comfortable and fit you well.  Are closed at the toe. Do not wear sandals.  If you use a stepladder:  Make sure that it is fully opened. Do not climb a closed  stepladder.  Make sure that both sides of the stepladder are locked into place.  Ask someone to hold it for you, if possible.  Clearly mark and make sure that you can see:  Any grab bars or handrails.  First and last steps.  Where the edge of each step is.  Use tools that help you move around (mobility aids) if they are needed. These include:  Canes.  Walkers.  Scooters.  Crutches.  Turn on the lights when you go into a dark area. Replace any light bulbs as soon as they burn out.  Set up your furniture so you have a clear path. Avoid moving your furniture around.  If any of your floors are uneven, fix them.  If there are any pets around you, be aware of where they are.  Review your medicines with your doctor. Some medicines can make you feel dizzy. This can increase your chance of falling. Ask your doctor what other things that you can do to help prevent falls. This information is not intended to replace advice given to you by your health care provider. Make sure you discuss any questions you have with your health care provider. Document Released: 11/14/2008 Document Revised: 06/26/2015 Document Reviewed: 02/22/2014 Elsevier Interactive Patient Education  2017 [employer].

## 2016-07-03 ENCOUNTER — Other Ambulatory Visit: Payer: Self-pay | Admitting: Gastroenterology

## 2016-07-05 NOTE — Telephone Encounter (Signed)
Patient needs office visit prior to next refill

## 2016-07-19 ENCOUNTER — Encounter: Payer: Self-pay | Admitting: Physician Assistant

## 2016-07-19 ENCOUNTER — Ambulatory Visit (INDEPENDENT_AMBULATORY_CARE_PROVIDER_SITE_OTHER): Payer: Medicare Other | Admitting: Physician Assistant

## 2016-07-19 VITALS — BP 136/74 | HR 74 | Temp 98.4°F | Resp 16 | Ht 67.0 in | Wt 131.0 lb

## 2016-07-19 DIAGNOSIS — T148XXA Other injury of unspecified body region, initial encounter: Secondary | ICD-10-CM | POA: Diagnosis not present

## 2016-07-19 DIAGNOSIS — R748 Abnormal levels of other serum enzymes: Secondary | ICD-10-CM

## 2016-07-19 DIAGNOSIS — Z780 Asymptomatic menopausal state: Secondary | ICD-10-CM

## 2016-07-19 DIAGNOSIS — E559 Vitamin D deficiency, unspecified: Secondary | ICD-10-CM | POA: Diagnosis not present

## 2016-07-19 DIAGNOSIS — Z0000 Encounter for general adult medical examination without abnormal findings: Secondary | ICD-10-CM | POA: Diagnosis not present

## 2016-07-19 DIAGNOSIS — E7800 Pure hypercholesterolemia, unspecified: Secondary | ICD-10-CM

## 2016-07-19 DIAGNOSIS — Z1239 Encounter for other screening for malignant neoplasm of breast: Secondary | ICD-10-CM

## 2016-07-19 DIAGNOSIS — R946 Abnormal results of thyroid function studies: Secondary | ICD-10-CM

## 2016-07-19 DIAGNOSIS — L84 Corns and callosities: Secondary | ICD-10-CM

## 2016-07-19 DIAGNOSIS — Z126 Encounter for screening for malignant neoplasm of bladder: Secondary | ICD-10-CM | POA: Diagnosis not present

## 2016-07-19 DIAGNOSIS — Z1231 Encounter for screening mammogram for malignant neoplasm of breast: Secondary | ICD-10-CM | POA: Diagnosis not present

## 2016-07-19 DIAGNOSIS — Z13820 Encounter for screening for osteoporosis: Secondary | ICD-10-CM

## 2016-07-19 DIAGNOSIS — R7989 Other specified abnormal findings of blood chemistry: Secondary | ICD-10-CM

## 2016-07-19 LAB — POCT URINALYSIS DIPSTICK
BILIRUBIN UA: NEGATIVE
GLUCOSE UA: NEGATIVE
Ketones, UA: NEGATIVE
LEUKOCYTES UA: NEGATIVE
NITRITE UA: NEGATIVE
Protein, UA: NEGATIVE
RBC UA: NEGATIVE
Spec Grav, UA: <=1.005 — AB (ref 1.010–1.025)
Urobilinogen, UA: 0.2 E.U./dL
pH, UA: 7.5 (ref 5.0–8.0)

## 2016-07-19 NOTE — Patient Instructions (Signed)
Health Maintenance for Postmenopausal Women Menopause is a normal process in which your reproductive ability comes to an end. This process happens gradually over a span of months to years, usually between the ages of 22 and 9. Menopause is complete when you have missed 12 consecutive menstrual periods. It is important to talk with your health care provider about some of the most common conditions that affect postmenopausal women, such as heart disease, cancer, and bone loss (osteoporosis). Adopting a healthy lifestyle and getting preventive care can help to promote your health and wellness. Those actions can also lower your chances of developing some of these common conditions. What should I know about menopause? During menopause, you may experience a number of symptoms, such as:  Moderate-to-severe hot flashes.  Night sweats.  Decrease in sex drive.  Mood swings.  Headaches.  Tiredness.  Irritability.  Memory problems.  Insomnia.  Choosing to treat or not to treat menopausal changes is an individual decision that you make with your health care provider. What should I know about hormone replacement therapy and supplements? Hormone therapy products are effective for treating symptoms that are associated with menopause, such as hot flashes and night sweats. Hormone replacement carries certain risks, especially as you become older. If you are thinking about using estrogen or estrogen with progestin treatments, discuss the benefits and risks with your health care provider. What should I know about heart disease and stroke? Heart disease, heart attack, and stroke become more likely as you age. This may be due, in part, to the hormonal changes that your body experiences during menopause. These can affect how your body processes dietary fats, triglycerides, and cholesterol. Heart attack and stroke are both medical emergencies. There are many things that you can do to help prevent heart disease  and stroke:  Have your blood pressure checked at least every 1-2 years. High blood pressure causes heart disease and increases the risk of stroke.  If you are 53-22 years old, ask your health care provider if you should take aspirin to prevent a heart attack or a stroke.  Do not use any tobacco products, including cigarettes, chewing tobacco, or electronic cigarettes. If you need help quitting, ask your health care provider.  It is important to eat a healthy diet and maintain a healthy weight. ? Be sure to include plenty of vegetables, fruits, low-fat dairy products, and lean protein. ? Avoid eating foods that are high in solid fats, added sugars, or salt (sodium).  Get regular exercise. This is one of the most important things that you can do for your health. ? Try to exercise for at least 150 minutes each week. The type of exercise that you do should increase your heart rate and make you sweat. This is known as moderate-intensity exercise. ? Try to do strengthening exercises at least twice each week. Do these in addition to the moderate-intensity exercise.  Know your numbers.Ask your health care provider to check your cholesterol and your blood glucose. Continue to have your blood tested as directed by your health care provider.  What should I know about cancer screening? There are several types of cancer. Take the following steps to reduce your risk and to catch any cancer development as early as possible. Breast Cancer  Practice breast self-awareness. ? This means understanding how your breasts normally appear and feel. ? It also means doing regular breast self-exams. Let your health care provider know about any changes, no matter how small.  If you are 40  or older, have a clinician do a breast exam (clinical breast exam or CBE) every year. Depending on your age, family history, and medical history, it may be recommended that you also have a yearly breast X-ray (mammogram).  If you  have a family history of breast cancer, talk with your health care provider about genetic screening.  If you are at high risk for breast cancer, talk with your health care provider about having an MRI and a mammogram every year.  Breast cancer (BRCA) gene test is recommended for women who have family members with BRCA-related cancers. Results of the assessment will determine the need for genetic counseling and BRCA1 and for BRCA2 testing. BRCA-related cancers include these types: ? Breast. This occurs in males or females. ? Ovarian. ? Tubal. This may also be called fallopian tube cancer. ? Cancer of the abdominal or pelvic lining (peritoneal cancer). ? Prostate. ? Pancreatic.  Cervical, Uterine, and Ovarian Cancer Your health care provider may recommend that you be screened regularly for cancer of the pelvic organs. These include your ovaries, uterus, and vagina. This screening involves a pelvic exam, which includes checking for microscopic changes to the surface of your cervix (Pap test).  For women ages 21-65, health care providers may recommend a pelvic exam and a Pap test every three years. For women ages 79-65, they may recommend the Pap test and pelvic exam, combined with testing for human papilloma virus (HPV), every five years. Some types of HPV increase your risk of cervical cancer. Testing for HPV may also be done on women of any age who have unclear Pap test results.  Other health care providers may not recommend any screening for nonpregnant women who are considered low risk for pelvic cancer and have no symptoms. Ask your health care provider if a screening pelvic exam is right for you.  If you have had past treatment for cervical cancer or a condition that could lead to cancer, you need Pap tests and screening for cancer for at least 20 years after your treatment. If Pap tests have been discontinued for you, your risk factors (such as having a new sexual partner) need to be  reassessed to determine if you should start having screenings again. Some women have medical problems that increase the chance of getting cervical cancer. In these cases, your health care provider may recommend that you have screening and Pap tests more often.  If you have a family history of uterine cancer or ovarian cancer, talk with your health care provider about genetic screening.  If you have vaginal bleeding after reaching menopause, tell your health care provider.  There are currently no reliable tests available to screen for ovarian cancer.  Lung Cancer Lung cancer screening is recommended for adults 69-62 years old who are at high risk for lung cancer because of a history of smoking. A yearly low-dose CT scan of the lungs is recommended if you:  Currently smoke.  Have a history of at least 30 pack-years of smoking and you currently smoke or have quit within the past 15 years. A pack-year is smoking an average of one pack of cigarettes per day for one year.  Yearly screening should:  Continue until it has been 15 years since you quit.  Stop if you develop a health problem that would prevent you from having lung cancer treatment.  Colorectal Cancer  This type of cancer can be detected and can often be prevented.  Routine colorectal cancer screening usually begins at  age 42 and continues through age 45.  If you have risk factors for colon cancer, your health care provider may recommend that you be screened at an earlier age.  If you have a family history of colorectal cancer, talk with your health care provider about genetic screening.  Your health care provider may also recommend using home test kits to check for hidden blood in your stool.  A small camera at the end of a tube can be used to examine your colon directly (sigmoidoscopy or colonoscopy). This is done to check for the earliest forms of colorectal cancer.  Direct examination of the colon should be repeated every  5-10 years until age 71. However, if early forms of precancerous polyps or small growths are found or if you have a family history or genetic risk for colorectal cancer, you may need to be screened more often.  Skin Cancer  Check your skin from head to toe regularly.  Monitor any moles. Be sure to tell your health care provider: ? About any new moles or changes in moles, especially if there is a change in a mole's shape or color. ? If you have a mole that is larger than the size of a pencil eraser.  If any of your family members has a history of skin cancer, especially at a young age, talk with your health care provider about genetic screening.  Always use sunscreen. Apply sunscreen liberally and repeatedly throughout the day.  Whenever you are outside, protect yourself by wearing long sleeves, pants, a wide-brimmed hat, and sunglasses.  What should I know about osteoporosis? Osteoporosis is a condition in which bone destruction happens more quickly than new bone creation. After menopause, you may be at an increased risk for osteoporosis. To help prevent osteoporosis or the bone fractures that can happen because of osteoporosis, the following is recommended:  If you are 46-71 years old, get at least 1,000 mg of calcium and at least 600 mg of vitamin D per day.  If you are older than age 55 but younger than age 65, get at least 1,200 mg of calcium and at least 600 mg of vitamin D per day.  If you are older than age 54, get at least 1,200 mg of calcium and at least 800 mg of vitamin D per day.  Smoking and excessive alcohol intake increase the risk of osteoporosis. Eat foods that are rich in calcium and vitamin D, and do weight-bearing exercises several times each week as directed by your health care provider. What should I know about how menopause affects my mental health? Depression may occur at any age, but it is more common as you become older. Common symptoms of depression  include:  Low or sad mood.  Changes in sleep patterns.  Changes in appetite or eating patterns.  Feeling an overall lack of motivation or enjoyment of activities that you previously enjoyed.  Frequent crying spells.  Talk with your health care provider if you think that you are experiencing depression. What should I know about immunizations? It is important that you get and maintain your immunizations. These include:  Tetanus, diphtheria, and pertussis (Tdap) booster vaccine.  Influenza every year before the flu season begins.  Pneumonia vaccine.  Shingles vaccine.  Your health care provider may also recommend other immunizations. This information is not intended to replace advice given to you by your health care provider. Make sure you discuss any questions you have with your health care provider. Document Released: 03/12/2005  Document Revised: 08/08/2015 Document Reviewed: 10/22/2014 Elsevier Interactive Patient Education  2018 [employer]vier Inc.  

## 2016-07-19 NOTE — Progress Notes (Signed)
Patient: Courtney Willis, Female    DOB: Apr 03, 1934, 82 y.o.   MRN: [id number] Visit Date: 07/19/2016  Today's Provider: Mar Daring, PA-C   Chief Complaint  Patient presents with  . Annual Wellness Exam   Subjective:    Annual wellness visit Courtney Willis is a 82 y.o. female. She feels well. She reports not regular exercising, but she does stay active. She reports she is sleeping well. She does yoga.  Colonoscopy- 2014. Repeat in 5 years. Normal. Tdap- 08/2015 Pap- 2016. Normal.  BMD-2015  Review of Systems  Constitutional: Negative.   HENT: Negative.   Eyes: Negative.   Respiratory: Negative.   Cardiovascular: Negative.   Gastrointestinal: Negative.   Endocrine: Negative.   Genitourinary: Negative.   Musculoskeletal: Negative.   Skin: Negative.   Allergic/Immunologic: Negative.   Neurological: Negative.   Hematological: Negative.   Psychiatric/Behavioral: Negative.     Social History   Social History  . Marital status: Divorced    Spouse name: N/A  . Number of children: N/A  . Years of education: N/A   Occupational History  . Not on file.   Social History Main Topics  . Smoking status: Former Research scientist (life sciences)  . Smokeless tobacco: Never Used     Comment: (08/29/15) quit 40 years ago  . Alcohol use 1.8 oz/week    3 Glasses of wine per week     Comment: 1- 4  . Drug use: No  . Sexual activity: Not on file   Other Topics Concern  . Not on file   Social History Narrative  . No narrative on file    Past Medical History:  Diagnosis Date  . Acid reflux 02/26/2008  . Arthritis   . 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 Avitaminosis D 08/12/2014  . Bowel disease 02/16/2008  . Bunion 08/12/2014  . Cancer [facility])    Skin cancer- basal- upper arm , upper chest  . CN (constipation) 08/12/2014  . DD (diverticular disease) 02/16/2008  . Elevated liver enzymes 08/12/2014  . Fibroids, submucosal 08/12/2014   of her lower lip   . Hypercholesteremia 08/12/2014  .  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 Post menopausal syndrome 08/12/2014     Patient Active Problem List   Diagnosis Date Noted  . Distal radius fracture, right 08/30/2015  . Elevated liver enzymes 08/12/2014  . Bunion 08/12/2014  . Cataract 08/12/2014  . CN (constipation) 08/12/2014  . Edema of foot 08/12/2014  . Fibroids, submucosal 08/12/2014  . Glaucoma 08/12/2014  . Hypercholesteremia 08/12/2014  . Allergic state 08/12/2014  . Coitalgia 08/12/2014  . 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 Avitaminosis D 08/12/2014  . Post menopausal syndrome 08/12/2014  . Use of proton pump inhibitor therapy 08/06/2014  . Alcohol use 08/06/2014  . Acid reflux 02/26/2008  . DD (diverticular disease) 02/16/2008  . Bowel disease 02/16/2008    Past Surgical History:  Procedure Laterality Date  . BUNIONECTOMY     05/2004, 2007  . CATARACT EXTRACTION     04/2005, 06/2005  . COLONOSCOPY W/ POLYPECTOMY    . HYSTEROSCOPY  2011  . ORIF WRIST FRACTURE Right 08/30/2015   Procedure: OPEN REDUCTION INTERNAL FIXATION (ORIF) RIGHT WRIST FRACTURE AND REPAIR AS NECESSARY;  Surgeon: Roseanne Kaufman, MD;  Location: [facility];  Service: Orthopedics;  Laterality: Right;  . UPPER GI ENDOSCOPY    . WRIST FRACTURE SURGERY Left    06/2006    Her family history includes AVM  in her daughter; Breast cancer in her paternal grandmother; Hodgkin's lymphoma in her sister; Lung cancer in her father; Rheum arthritis in her mother; Ulcers in her father.      Current Outpatient Prescriptions:  .  ascorbic acid (VITAMIN C) 500 MG tablet, Take 1 tablet by mouth daily., Disp: , Rfl:  .  chlorpheniramine (ALLERGY) 4 MG tablet, Take 4 mg by mouth 2 (two) times daily as needed for allergies., Disp: , Rfl:  .  Cholecalciferol (VITAMIN D3) 1000 UNITS CAPS, Take 1 capsule by mouth daily., Disp: , Rfl:  .  fluticasone (FLONASE) 50 MCG/ACT nasal spray, Place 2 sprays into the nose daily., Disp: , Rfl:  .  polyethylene glycol  powder (GLYCOLAX/MIRALAX) powder, Take 1 Container by mouth daily. , Disp: , Rfl:  .  polyvinyl alcohol-povidone (REFRESH) 1.4-0.6 % ophthalmic solution, Apply 1 drop to eye daily as needed (dry eyes). , Disp: , Rfl:  .  Propylene Glycol (SYSTANE BALANCE) 0.6 % SOLN, Apply 1 drop to eye as needed., Disp: , Rfl:  .  timolol (TIMOPTIC) 0.5 % ophthalmic solution, Place 1 drop into both eyes at bedtime., Disp: , Rfl: 5 .  vitamin B-12 (CYANOCOBALAMIN) 500 MCG tablet, Take 500 mcg by mouth daily., Disp: , Rfl:  .  pantoprazole (PROTONIX) 40 MG tablet, TAKE 1 TABLET (40 MG TOTAL) BY MOUTH DAILY. (Patient not taking: Reported on 07/19/2016), Disp: 30 tablet, Rfl: 0  Patient Care Team: Mar Daring, PA-C as PCP - General (Family Medicine) Dingeldein, Remo Lipps, MD as Consulting Physician (Ophthalmology)     Objective:   Vitals: BP 136/74 (BP Location: Right Arm, Patient Position: Sitting, Cuff Size: Normal)   Pulse 74   Temp 98.4 F (36.9 C)   Resp 16   Ht 5\' 7"  (1.702 m)   Wt 131 lb (59.4 kg)   SpO2 99%   BMI 20.52 kg/m   Physical Exam  Constitutional: She is oriented to person, place, and time. She appears well-developed and well-nourished. No distress.  HENT:  Head: Normocephalic and atraumatic.  Right Ear: Hearing, tympanic membrane, external ear and ear canal normal.  Left Ear: Hearing, tympanic membrane, external ear and ear canal normal.  Nose: Nose normal.  Mouth/Throat: Uvula is midline, oropharynx is clear and moist and mucous membranes are normal. No oropharyngeal exudate.  Eyes: Conjunctivae and EOM are normal. Pupils are equal, round, and reactive to light. Right eye exhibits no discharge. Left eye exhibits no discharge. No scleral icterus.  Neck: Normal range of motion. Neck supple. No JVD present. Carotid bruit is not present. No tracheal deviation present. No thyromegaly present.  Cardiovascular: Normal rate, regular rhythm, normal heart sounds and intact distal pulses.   Exam reveals no gallop and no friction rub.   No murmur heard. Pulmonary/Chest: Effort normal and breath sounds normal. No respiratory distress. She has no wheezes. She has no rales. She exhibits no tenderness. Right breast exhibits no inverted nipple, no mass, no nipple discharge, no skin change and no tenderness. Left breast exhibits no inverted nipple, no mass, no nipple discharge, no skin change and no tenderness. Breasts are symmetrical.  Abdominal: Soft. Bowel sounds are normal. She exhibits no distension and no mass. There is no tenderness. There is no rebound and no guarding.  Musculoskeletal: Normal range of motion. She exhibits no edema or tenderness.  Lymphadenopathy:    She has no cervical adenopathy.  Neurological: She is alert and oriented to person, place, and time.  Skin: Skin is warm  and dry. No rash noted. She is not diaphoretic.     Psychiatric: She has a normal mood and affect. Her behavior is normal. Judgment and thought content normal.  Vitals reviewed.   Activities of Daily Living In your present state of health, do you have any difficulty performing the following activities: 07/01/2016 08/30/2015  Hearing? N N  Vision? N N  Difficulty concentrating or making decisions? N N  Walking or climbing stairs? N Y  Dressing or bathing? N Y  Doing errands, shopping? N -  Preparing Food and eating ? N -  Using the Toilet? N -  In the past six months, have you accidently leaked urine? Y -  Do you have problems with loss of bowel control? N -  Managing your Medications? N -  Managing your Finances? N -  Housekeeping or managing your Housekeeping? N -  Some recent data might be hidden    Fall Risk Assessment Fall Risk  07/01/2016 07/08/2015  Falls in the past year? Yes Yes  Number falls in past yr: 1 -  Injury with Fall? Yes No  Follow up Falls prevention discussed -     Depression Screen PHQ 2/9 Scores 07/01/2016 07/01/2016 07/08/2015  PHQ - 2 Score 0 0 0  PHQ- 9 Score  0 - -    Cognitive Testing - 6-CIT 6CIT Screen 07/01/2016  What Year? 0 points  What month? 0 points  What time? 0 points  Count back from 20 0 points  Months in reverse 0 points  Repeat phrase 0 points  Total Score 0        Assessment & Plan:     Annual Wellness Visit  Reviewed patient's Family Medical History Reviewed and updated list of patient's medical providers Assessment of cognitive impairment was done Assessed patient's functional ability Established a written schedule for health screening [place] Completed and Reviewed  Exercise Activities and Dietary recommendations Goals    . Increase water intake          Recommend to continue current healthy diet and daily water intake of 6-8 glasses.         Immunization History  Administered Date(s) Administered  . Pneumococcal Conjugate-13 07/02/2014  . Pneumococcal Polysaccharide-23 07/01/2004, 11/05/2004  . Td 01/14/2003, 04/08/2010  . Tdap 04/08/2010, 08/26/2015  . Zoster 03/11/2005    Health Maintenance  Topic Date Due  . INFLUENZA VACCINE  09/01/2016  . TETANUS/TDAP  08/25/2025  . DEXA SCAN  Completed  . PNA vac Low Risk Adult  Completed     Discussed health benefits of physical activity, and encouraged her to engage in regular exercise appropriate for her age and condition.    1. Annual physical exam Normal physical exam today. Will check labs as below and f/u pending lab results. If labs are stable and WNL she will not need to have these rechecked for one year at her next annual physical exam. She is to call the office in the meantime if she has any acute issue, questions or concerns. - CBC w/Diff/Platelet - Comprehensive Metabolic Panel (CMET) - Lipid Profile - TSH  2. Breast cancer screening Breast exam today was normal. There is no family history of breast cancer. She does perform regular self breast exams. Mammogram was ordered as below. Information for [facility]  Breast clinic was given to patient so she may schedule her mammogram at her convenience. - MM Digital Screening; Future  3. Bladder cancer screening UA normal today. - POCT  Urinalysis Dipstick  4. Osteoporosis screening Last BMD in 2015. Known osteopenia. Completed 2 years of forteo treatment. Due for repeat BMD.  - DG Bone Density; Future  5. Avitaminosis D See above medical treatment plan. - DG Bone Density; Future  6. Postmenopausal estrogen deficiency See above medical treatment plan. - DG Bone Density; Future  7. Hypercholesteremia Diet controlled. Will check labs as below and f/u pending results. - Comprehensive Metabolic Panel (CMET) - Lipid Profile  8. Elevated liver enzymes Diet controlled. Will check labs as below and f/u pending results. - Comprehensive Metabolic Panel (CMET)  9. Abnormal TSH Asymptomatic. Will check labs as below and f/u pending results. - TSH  10. Splinter in skin Removed today in the office with 11 blade scalpel and tweezers.  11. Corn or callus Cryotherapy performed using cryopen today without complication. - Cryotherapy/destruct benign or premalignant lesion      Mar Daring, PA-C  [place]

## 2016-07-21 ENCOUNTER — Telehealth: Payer: Self-pay

## 2016-07-21 DIAGNOSIS — R748 Abnormal levels of other serum enzymes: Secondary | ICD-10-CM

## 2016-07-21 LAB — COMPREHENSIVE METABOLIC PANEL
ALK PHOS: 57 IU/L (ref 39–117)
ALT: 11 IU/L (ref 0–32)
AST: 15 IU/L (ref 0–40)
Albumin/Globulin Ratio: 2.3 — ABNORMAL HIGH (ref 1.2–2.2)
Albumin: 4.3 g/dL (ref 3.5–4.7)
BILIRUBIN TOTAL: 0.5 mg/dL (ref 0.0–1.2)
BUN/Creatinine Ratio: 16 (ref 12–28)
BUN: 14 mg/dL (ref 8–27)
CHLORIDE: 101 mmol/L (ref 96–106)
CO2: 25 mmol/L (ref 20–29)
Calcium: 9.4 mg/dL (ref 8.7–10.3)
Creatinine, Ser: 0.87 mg/dL (ref 0.57–1.00)
GFR calc Af Amer: 73 mL/min/{1.73_m2} (ref >59)
GFR calc non Af Amer: 63 mL/min/{1.73_m2} (ref >59)
GLOBULIN, TOTAL: 1.9 g/dL (ref 1.5–4.5)
Glucose: 99 mg/dL (ref 65–99)
Potassium: 4.3 mmol/L (ref 3.5–5.2)
SODIUM: 141 mmol/L (ref 134–144)
Total Protein: 6.2 g/dL (ref 6.0–8.5)

## 2016-07-21 LAB — CBC WITH DIFFERENTIAL/PLATELET
Basophils Absolute: 0 10*3/uL (ref 0.0–0.2)
Basos: 0 %
EOS (ABSOLUTE): 0.1 10*3/uL (ref 0.0–0.4)
EOS: 1 %
HEMATOCRIT: 38.8 % (ref 34.0–46.6)
HEMOGLOBIN: 12.6 g/dL (ref 11.1–15.9)
Immature Grans (Abs): 0 10*3/uL (ref 0.0–0.1)
Immature Granulocytes: 0 %
Lymphocytes Absolute: 1.8 10*3/uL (ref 0.7–3.1)
Lymphs: 41 %
MCH: 30.2 pg (ref 26.6–33.0)
MCHC: 32.5 g/dL (ref 31.5–35.7)
MCV: 93 fL (ref 79–97)
MONOCYTES: 8 %
Monocytes Absolute: 0.3 10*3/uL (ref 0.1–0.9)
NEUTROS ABS: 2.2 10*3/uL (ref 1.4–7.0)
Neutrophils: 50 %
Platelets: 307 10*3/uL (ref 150–379)
RBC: 4.17 x10E6/uL (ref 3.77–5.28)
RDW: 13.9 % (ref 12.3–15.4)
WBC: 4.5 10*3/uL (ref 3.4–10.8)

## 2016-07-21 LAB — LIPID PANEL
CHOLESTEROL TOTAL: 212 mg/dL — AB (ref 100–199)
Chol/HDL Ratio: 2.5 ratio (ref 0.0–4.4)
HDL: 85 mg/dL (ref >39)
LDL CALC: 114 mg/dL — AB (ref 0–99)
Triglycerides: 63 mg/dL (ref 0–149)
VLDL Cholesterol Cal: 13 mg/dL (ref 5–40)

## 2016-07-21 LAB — TSH: TSH: 7.47 u[IU]/mL — ABNORMAL HIGH (ref 0.450–4.500)

## 2016-07-21 NOTE — Telephone Encounter (Signed)
Patient advised as below. Patient requested a copy of labs. Printed and ready for pick up. sd Patient also requesting to have Dx. Of elevated liver enzymes changed.

## 2016-07-21 NOTE — Telephone Encounter (Signed)
-----   Message from Mar Daring, [place] sent at 07/21/2016 12:46 PM EDT ----- All labs are within normal limits and stable.  Thanks! -JB

## 2016-07-21 NOTE — Telephone Encounter (Signed)
This was resolved as labs were normal

## 2016-07-22 ENCOUNTER — Telehealth: Payer: Self-pay

## 2016-07-22 NOTE — Telephone Encounter (Signed)
LMTCB

## 2016-07-22 NOTE — Telephone Encounter (Signed)
Patient advised as directed below.  Thanks,  -Joseline 

## 2016-07-22 NOTE — Telephone Encounter (Signed)
Patient came by and picked up a copy of her lab results and noticed TSH level was higher then last time. She wanted to make sure she does not need to do anything about this?-aa

## 2016-07-22 NOTE — Telephone Encounter (Signed)
Please notify patient that since she is not having any symptoms we will just continue to monitor. It is very possible the thyroid level will return. Plus we do not treat as aggressively as patient's age because it is natural for the TSH to rise some.

## 2016-10-18 ENCOUNTER — Other Ambulatory Visit: Payer: Medicare Other

## 2016-11-22 ENCOUNTER — Ambulatory Visit
Admission: RE | Admit: 2016-11-22 | Discharge: 2016-11-22 | Disposition: A | Discharge status: Home / Self Care | Payer: Medicare Other | Source: Ambulatory Visit | Attending: Physician Assistant | Admitting: Physician Assistant

## 2016-11-22 DIAGNOSIS — M810 Age-related osteoporosis without current pathological fracture: Secondary | ICD-10-CM | POA: Insufficient documentation

## 2016-11-22 DIAGNOSIS — E559 Vitamin D deficiency, unspecified: Secondary | ICD-10-CM | POA: Diagnosis not present

## 2016-11-22 DIAGNOSIS — Z780 Asymptomatic menopausal state: Secondary | ICD-10-CM | POA: Diagnosis not present

## 2016-11-22 DIAGNOSIS — Z13820 Encounter for screening for osteoporosis: Secondary | ICD-10-CM | POA: Diagnosis not present

## 2016-11-22 DIAGNOSIS — Z1239 Encounter for other screening for malignant neoplasm of breast: Secondary | ICD-10-CM

## 2016-11-22 DIAGNOSIS — Z1231 Encounter for screening mammogram for malignant neoplasm of breast: Secondary | ICD-10-CM | POA: Diagnosis not present

## 2016-11-23 ENCOUNTER — Telehealth: Payer: Self-pay

## 2016-11-23 NOTE — Telephone Encounter (Signed)
-----   Message from Mar Daring, [place] sent at 11/22/2016  2:41 PM EDT ----- Bone density has worsened from T score -3.7 in 2015 to -4.5 currently. This is worsening osteoporosis that would put her at risk for fracture. If she desires, she can schedule an appt to discuss treatment options. If not interested in Rx treatment, she needs to make sure to take Calcium 1200mg  daily, Vit D 800IU daily (minimum dosing for both) and continue weight bearing exercises.

## 2016-11-23 NOTE — Telephone Encounter (Signed)
LMTCB  Thanks,  -Severa Jeremiah 

## 2016-11-23 NOTE — Telephone Encounter (Signed)
-----   Message from Mar Daring, [place] sent at 11/22/2016  4:55 PM EDT ----- Normal mammogram. Repeat screening in one year.

## 2016-11-23 NOTE — Telephone Encounter (Signed)
Patient reports prefers to come to an appointment and scheduled for tomorrow.  Thanks,  -Joseline

## 2016-11-24 ENCOUNTER — Ambulatory Visit (INDEPENDENT_AMBULATORY_CARE_PROVIDER_SITE_OTHER): Payer: Medicare Other | Admitting: Physician Assistant

## 2016-11-24 ENCOUNTER — Encounter: Payer: Self-pay | Admitting: Physician Assistant

## 2016-11-24 VITALS — BP 122/80 | HR 66 | Temp 98.3°F | Wt 131.2 lb

## 2016-11-24 DIAGNOSIS — M810 Age-related osteoporosis without current pathological fracture: Secondary | ICD-10-CM | POA: Diagnosis not present

## 2016-11-24 DIAGNOSIS — Z23 Encounter for immunization: Secondary | ICD-10-CM

## 2016-11-24 DIAGNOSIS — Z1211 Encounter for screening for malignant neoplasm of colon: Secondary | ICD-10-CM | POA: Diagnosis not present

## 2016-11-24 NOTE — Patient Instructions (Signed)
Calcium '1200mg'$   Vit D 800IU  Prolia- Denosumab injection What is this medicine? DENOSUMAB (den oh sue mab) slows bone breakdown. Prolia is used to treat osteoporosis in women after menopause and in men. Delton See is used to treat a high calcium level due to cancer and to prevent bone fractures and other bone problems caused by multiple myeloma or cancer bone metastases. Delton See is also used to treat giant cell tumor of the bone. This medicine may be used for other purposes; ask your health care provider or pharmacist if you have questions. COMMON BRAND NAME(S): Prolia, XGEVA What should I tell my health care provider before I take this medicine? They need to know if you have any of these conditions: -dental disease -having surgery or tooth extraction -infection -kidney disease -low levels of calcium or Vitamin D in the blood -malnutrition -on hemodialysis -skin conditions or sensitivity -thyroid or parathyroid disease -an unusual reaction to denosumab, other medicines, foods, dyes, or preservatives -pregnant or trying to get pregnant -breast-feeding How should I use this medicine? This medicine is for injection under the skin. It is given by a health care professional in a hospital or clinic setting. If you are getting Prolia, a special MedGuide will be given to you by the pharmacist with each prescription and refill. Be sure to read this information carefully each time. For Prolia, talk to your pediatrician regarding the use of this medicine in children. Special care may be needed. For Delton See, talk to your pediatrician regarding the use of this medicine in children. While this drug may be prescribed for children as young as 13 years for selected conditions, precautions do apply. Overdosage: If you think you have taken too much of this medicine contact a poison control center or emergency room at once. NOTE: This medicine is only for you. Do not share this medicine with others. What if I miss a  dose? It is important not to miss your dose. Call your doctor or health care professional if you are unable to keep an appointment. What may interact with this medicine? Do not take this medicine with any of the following medications: -other medicines containing denosumab This medicine may also interact with the following medications: -medicines that lower your chance of fighting infection -steroid medicines like prednisone or cortisone This list may not describe all possible interactions. Give your health care provider a list of all the medicines, herbs, non-prescription drugs, or dietary supplements you use. Also tell them if you smoke, drink alcohol, or use illegal drugs. Some items may interact with your medicine. What should I watch for while using this medicine? Visit your doctor or health care professional for regular checks on your progress. Your doctor or health care professional may order blood tests and other tests to see how you are doing. Call your doctor or health care professional for advice if you get a fever, chills or sore throat, or other symptoms of a cold or flu. Do not treat yourself. This drug may decrease your body's ability to fight infection. Try to avoid being around people who are sick. You should make sure you get enough calcium and vitamin D while you are taking this medicine, unless your doctor tells you not to. Discuss the foods you eat and the vitamins you take with your health care professional. See your dentist regularly. Brush and floss your teeth as directed. Before you have any dental work done, tell your dentist you are receiving this medicine. Do not become pregnant while taking  this medicine or for 5 months after stopping it. Talk with your doctor or health care professional about your birth control options while taking this medicine. Women should inform their doctor if they wish to become pregnant or think they might be pregnant. There is a potential for  serious side effects to an unborn child. Talk to your health care professional or pharmacist for more information. What side effects may I notice from receiving this medicine? Side effects that you should report to your doctor or health care professional as soon as possible: -allergic reactions like skin rash, itching or hives, swelling of the face, lips, or tongue -bone pain -breathing problems -dizziness -jaw pain, especially after dental work -redness, blistering, peeling of the skin -signs and symptoms of infection like fever or chills; cough; sore throat; pain or trouble passing urine -signs of low calcium like fast heartbeat, muscle cramps or muscle pain; pain, tingling, numbness in the hands or feet; seizures -unusual bleeding or bruising -unusually weak or tired Side effects that usually do not require medical attention (report to your doctor or health care professional if they continue or are bothersome): -constipation -diarrhea -headache -joint pain -loss of appetite -muscle pain -runny nose -tiredness -upset stomach This list may not describe all possible side effects. Call your doctor for medical advice about side effects. You may report side effects to FDA at 1-800-FDA-1088. Where should I keep my medicine? This medicine is only given in a clinic, doctor's office, or other health care setting and will not be stored at home. NOTE: This sheet is a summary. It may not cover all possible information. If you have questions about this medicine, talk to your doctor, pharmacist, or health care provider.  2018 Elsevier/Gold Standard (2016-02-10 19:17:21)

## 2016-11-24 NOTE — Progress Notes (Signed)
Patient: Courtney Willis Female    DOB: 1934/05/22   82 y.o.   MRN: [id number] Visit Date: 11/24/2016  Today's Provider: Mar Daring, PA-C   Chief Complaint  Patient presents with  . Discuss Bone Density results   Subjective:    HPI Patient is here to discuss bone density results. Bone density was done on 11/22/2016. Bone density worsened from -3.7 in 2015 to -4.5 currently. Patient has been on treatment previously including Fosamax and Boniva for 5 years.    Previous Medications   ASCORBIC ACID (VITAMIN C) 500 MG TABLET    Take 1 tablet by mouth daily.   CHLORPHENIRAMINE (ALLERGY) 4 MG TABLET    Take 4 mg by mouth 2 (two) times daily as needed for allergies.   CHOLECALCIFEROL (VITAMIN D3) 1000 UNITS CAPS    Take 1 capsule by mouth daily.   FLUOROURACIL (EFUDEX) 5 % CREAM    APPLY TO LEFT CHEEK TWICE DAILY FOR 4 WEEKS   FLUTICASONE (FLONASE) 50 MCG/ACT NASAL SPRAY    Place 2 sprays into the nose daily.   POLYETHYLENE GLYCOL POWDER (GLYCOLAX/MIRALAX) POWDER    Take 1 Container by mouth daily.    POLYVINYL ALCOHOL-POVIDONE (REFRESH) 1.4-0.6 % OPHTHALMIC SOLUTION    Apply 1 drop to eye daily as needed (dry eyes).    PROPYLENE GLYCOL (SYSTANE BALANCE) 0.6 % SOLN    Apply 1 drop to eye as needed.   TIMOLOL (TIMOPTIC) 0.5 % OPHTHALMIC SOLUTION    Place 1 drop into both eyes at bedtime.   VITAMIN B-12 (CYANOCOBALAMIN) 500 MCG TABLET    Take 500 mcg by mouth daily.    Review of Systems  Constitutional: Negative.   Respiratory: Negative.   Cardiovascular: Negative.   Gastrointestinal: Negative.   Neurological: Negative.     Social History  Substance Use Topics  . Smoking status: Former Research scientist (life sciences)  . Smokeless tobacco: Never Used     Comment: (08/29/15) quit 40 years ago  . Alcohol use 1.8 oz/week    3 Glasses of wine per week     Comment: 1- 4   Objective:   BP 122/80 (BP Location: Right Arm, Patient Position: Sitting, Cuff Size: Normal)   Pulse 66   Temp 98.3 F (36.8 C)  (Oral)   Wt 131 lb 3.2 oz (59.5 kg)   SpO2 99%   BMI 20.55 kg/m   Physical Exam  Constitutional: She appears well-developed and well-nourished. No distress.  Neck: Normal range of motion. Neck supple.  Cardiovascular: Normal rate, regular rhythm and normal heart sounds.  Exam reveals no gallop and no friction rub.   No murmur heard. Pulmonary/Chest: Effort normal and breath sounds normal. No respiratory distress. She has no wheezes. She has no rales.  Skin: She is not diaphoretic.  Vitals reviewed.      Assessment & Plan:     1. Age-related osteoporosis without current pathological fracture Patient does not wish to restart any treatments for osteoporosis at this time. She is going to restart her yoga that she was doing 3 days per week, increase her daily calcium supplement and continue her Vit D supplementation. We have agreed to recheck BMD in 1 year and if it worsens again she will then consider treatments.   2. Colon cancer screening Due for colonoscopy. Order placed.  - Ambulatory referral to Gastroenterology  3. Need for influenza vaccination Flu vaccine given today without complication. Patient sat upright for 15 minutes to check for adverse reaction before being  released. - Flu vaccine HIGH DOSE PF   Follow up: No Follow-up on file.

## 2017-01-20 ENCOUNTER — Telehealth: Payer: Self-pay

## 2017-01-20 NOTE — Telephone Encounter (Signed)
Gastroenterology Pre-Procedure Review  Request Date:  Requesting Physician: Dr. Vicente Males   PATIENT REVIEW QUESTIONS: The patient responded to the following health history questions as indicated:    1. Are you having any GI issues? No  2. Do you have a personal history of Polyps? Yes  3. Do you have a family history of Colon Cancer or Polyps? Yes  4. Diabetes Mellitus? No  5. Joint replacements in the past 12 months? No  6. Major health problems in the past 3 months? No  7. Any artificial heart valves, MVP, or defibrillator? No     MEDICATIONS & ALLERGIES:    Patient reports the following regarding taking any anticoagulation/antiplatelet therapy:   Plavix, Coumadin, Eliquis, Xarelto, Lovenox, Pradaxa, Brilinta, or Effient? No  Aspirin?  No   Patient confirms/reports the following medications:  Current Outpatient Medications  Medication Sig Dispense Refill  . ascorbic acid (VITAMIN C) 500 MG tablet Take 1 tablet by mouth daily.    . chlorpheniramine (ALLERGY) 4 MG tablet Take 4 mg by mouth 2 (two) times daily as needed for allergies.    . Cholecalciferol (VITAMIN D3) 1000 UNITS CAPS Take 1 capsule by mouth daily.    . fluorouracil (EFUDEX) 5 % cream APPLY TO LEFT CHEEK TWICE DAILY FOR 4 WEEKS  0  . fluticasone (FLONASE) 50 MCG/ACT nasal spray Place 2 sprays into the nose daily.    . polyethylene glycol powder (GLYCOLAX/MIRALAX) powder Take 1 Container by mouth daily.     . polyvinyl alcohol-povidone (REFRESH) 1.4-0.6 % ophthalmic solution Apply 1 drop to eye daily as needed (dry eyes).     . Propylene Glycol (SYSTANE BALANCE) 0.6 % SOLN Apply 1 drop to eye as needed.    . timolol (TIMOPTIC) 0.5 % ophthalmic solution Place 1 drop into both eyes at bedtime.  5  . vitamin B-12 (CYANOCOBALAMIN) 500 MCG tablet Take 500 mcg by mouth daily.     No current facility-administered medications for this visit.     Patient confirms/reports the following allergies:  No Known Allergies  No orders  of the defined types were placed in this encounter.   AUTHORIZATION INFORMATION Primary Insurance: 1D#: Group #:  Secondary Insurance: 1D#: Group #:  SCHEDULE INFORMATION: Date: 02/15/17 Time: Location: [facility]

## 2017-01-26 ENCOUNTER — Other Ambulatory Visit: Payer: Self-pay

## 2017-01-26 DIAGNOSIS — Z86010 Personal history of colonic polyps: Secondary | ICD-10-CM

## 2017-02-15 ENCOUNTER — Encounter: Payer: Self-pay | Admitting: *Deleted

## 2017-02-15 ENCOUNTER — Ambulatory Visit: Payer: Medicare Other | Admitting: Certified Registered"

## 2017-02-15 ENCOUNTER — Ambulatory Visit
Admission: RE | Admit: 2017-02-15 | Discharge: 2017-02-15 | Disposition: A | Discharge status: Home / Self Care | Payer: Medicare Other | Source: Ambulatory Visit | Attending: Gastroenterology | Admitting: Gastroenterology

## 2017-02-15 ENCOUNTER — Encounter
Admission: RE | Disposition: A | Discharge status: Home / Self Care | Payer: Self-pay | Source: Ambulatory Visit | Attending: Gastroenterology

## 2017-02-15 DIAGNOSIS — Z1211 Encounter for screening for malignant neoplasm of colon: Secondary | ICD-10-CM | POA: Insufficient documentation

## 2017-02-15 DIAGNOSIS — Z85828 Personal history of other malignant neoplasm of skin: Secondary | ICD-10-CM | POA: Insufficient documentation

## 2017-02-15 DIAGNOSIS — K5730 Diverticulosis of large intestine without perforation or abscess without bleeding: Secondary | ICD-10-CM | POA: Diagnosis not present

## 2017-02-15 DIAGNOSIS — E7800 Pure hypercholesterolemia, unspecified: Secondary | ICD-10-CM | POA: Insufficient documentation

## 2017-02-15 DIAGNOSIS — E559 Vitamin D deficiency, unspecified: Secondary | ICD-10-CM | POA: Diagnosis not present

## 2017-02-15 DIAGNOSIS — D120 Benign neoplasm of cecum: Secondary | ICD-10-CM | POA: Insufficient documentation

## 2017-02-15 DIAGNOSIS — K219 Gastro-esophageal reflux disease without esophagitis: Secondary | ICD-10-CM | POA: Insufficient documentation

## 2017-02-15 DIAGNOSIS — Z79899 Other long term (current) drug therapy: Secondary | ICD-10-CM | POA: Diagnosis not present

## 2017-02-15 DIAGNOSIS — Z86010 Personal history of colonic polyps: Secondary | ICD-10-CM | POA: Insufficient documentation

## 2017-02-15 DIAGNOSIS — Z87891 Personal history of nicotine dependence: Secondary | ICD-10-CM | POA: Diagnosis not present

## 2017-02-15 DIAGNOSIS — D122 Benign neoplasm of ascending colon: Secondary | ICD-10-CM | POA: Diagnosis not present

## 2017-02-15 DIAGNOSIS — D124 Benign neoplasm of descending colon: Secondary | ICD-10-CM

## 2017-02-15 HISTORY — PX: COLONOSCOPY WITH PROPOFOL: SHX5780

## 2017-02-15 SURGERY — COLONOSCOPY WITH PROPOFOL
Anesthesia: General

## 2017-02-15 MED ORDER — LIDOCAINE HCL (CARDIAC) 20 MG/ML IV SOLN
INTRAVENOUS | Status: DC | PRN
  Administered 2017-02-15: 50 mg via INTRAVENOUS

## 2017-02-15 MED ORDER — PROPOFOL 10 MG/ML IV BOLUS
INTRAVENOUS | Status: DC | PRN
  Administered 2017-02-15: 20 mg via INTRAVENOUS
  Administered 2017-02-15: 10 mg via INTRAVENOUS
  Administered 2017-02-15: 30 mg via INTRAVENOUS

## 2017-02-15 MED ORDER — PHENYLEPHRINE HCL 10 MG/ML IJ SOLN
INTRAMUSCULAR | Status: AC
  Filled 2017-02-15: qty 1

## 2017-02-15 MED ORDER — PROPOFOL 500 MG/50ML IV EMUL
INTRAVENOUS | Status: DC | PRN
  Administered 2017-02-15: 75 ug/kg/min via INTRAVENOUS

## 2017-02-15 MED ORDER — PROPOFOL 500 MG/50ML IV EMUL
INTRAVENOUS | Status: AC
  Filled 2017-02-15: qty 50

## 2017-02-15 MED ORDER — ONDANSETRON HCL 4 MG/2ML IJ SOLN
INTRAMUSCULAR | Status: DC | PRN
  Administered 2017-02-15: 4 mg via INTRAVENOUS

## 2017-02-15 MED ORDER — METHYLENE BLUE 0.5 % INJ SOLN
INTRAVENOUS | Status: DC | PRN
  Administered 2017-02-15: 1 mL via SUBMUCOSAL

## 2017-02-15 MED ORDER — METHYLENE BLUE 1 % INJ SOLN
INTRAMUSCULAR | Status: DC
Start: 2017-02-15 — End: 2017-02-15
  Filled 2017-02-15: qty 10

## 2017-02-15 MED ORDER — LIDOCAINE HCL (PF) 2 % IJ SOLN
INTRAMUSCULAR | Status: AC
  Filled 2017-02-15: qty 10

## 2017-02-15 MED ORDER — SODIUM CHLORIDE 0.9 % IV SOLN
INTRAVENOUS | Status: DC
  Administered 2017-02-15: 1000 mL via INTRAVENOUS

## 2017-02-15 MED ORDER — ONDANSETRON HCL 4 MG/2ML IJ SOLN
INTRAMUSCULAR | Status: AC
  Filled 2017-02-15: qty 2

## 2017-02-15 NOTE — Anesthesia Post-op Follow-up Note (Signed)
Anesthesia QCDR form completed.        

## 2017-02-15 NOTE — Transfer of Care (Signed)
Immediate Anesthesia Transfer of Care Note  Patient: Courtney Willis  Procedure(s) Performed: COLONOSCOPY WITH PROPOFOL (N/A )  Patient Location: PACU  Anesthesia Type:General  Level of Consciousness: sedated and responds to stimulation  Airway & Oxygen Therapy: Patient Spontanous Breathing and Patient connected to nasal cannula oxygen  Post-op Assessment: Report given to RN and Post -op Vital signs reviewed and stable  Post vital signs: Reviewed and stable  Last Vitals:  Vitals:   02/15/17 0730 02/15/17 0853  BP: 129/80 125/75  Pulse: 83 74  Resp: 18 (!) 22  Temp: (!) 35.9 C   SpO2: 95% 100%    Last Pain:  Vitals:   02/15/17 0730  TempSrc: Tympanic         Complications: No apparent anesthesia complications

## 2017-02-15 NOTE — H&P (Signed)
Jonathon Bellows, MD [address], [place], [place], [place], [address] [address], [place], [place], [place], [address] Phone: [phone]  Fax: [phone]  Primary Care Physician:  Mar Daring, PA-C   Pre-Procedure History & Physical: HPI:  Courtney Willis is a 83 y.o. female is here for an colonoscopy.   Past Medical History:  Diagnosis Date  . Acid reflux 02/26/2008  . Arthritis   . 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 Avitaminosis D 08/12/2014  . Bowel disease 02/16/2008  . Bunion 08/12/2014  . Cancer [facility])    Skin cancer- basal- upper arm , upper chest  . CN (constipation) 08/12/2014  . DD (diverticular disease) 02/16/2008  . Elevated liver enzymes 08/12/2014  . Fibroids, submucosal 08/12/2014   of her lower lip   . Hypercholesteremia 08/12/2014  . 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 Post menopausal syndrome 08/12/2014    Past Surgical History:  Procedure Laterality Date  . BUNIONECTOMY     05/2004, 2007  . CATARACT EXTRACTION     04/2005, 06/2005  . COLONOSCOPY W/ POLYPECTOMY    . HYSTEROSCOPY  2011  . ORIF WRIST FRACTURE Right 08/30/2015   Procedure: OPEN REDUCTION INTERNAL FIXATION (ORIF) RIGHT WRIST FRACTURE AND REPAIR AS NECESSARY;  Surgeon: Roseanne Kaufman, MD;  Location: [place];  Service: Orthopedics;  Laterality: Right;  . UPPER GI ENDOSCOPY    . WRIST FRACTURE SURGERY Left    06/2006    Prior to Admission medications   Medication Sig Start Date End Date Taking? Authorizing Provider  ascorbic acid (VITAMIN C) 500 MG tablet Take 1 tablet by mouth daily.    [provider]  chlorpheniramine (ALLERGY) 4 MG tablet Take 4 mg by mouth 2 (two) times daily as needed for allergies.    [provider]  Cholecalciferol (VITAMIN D3) 1000 UNITS CAPS Take 1 capsule by mouth daily.    [provider]  fluorouracil (EFUDEX) 5 % cream APPLY TO LEFT CHEEK TWICE DAILY FOR 4 WEEKS 11/09/16   [provider]  fluticasone (FLONASE) 50 MCG/ACT nasal spray  Place 2 sprays into the nose daily.    [provider]  polyethylene glycol powder (GLYCOLAX/MIRALAX) powder Take 1 Container by mouth daily.     [provider]  polyvinyl alcohol-povidone (REFRESH) 1.4-0.6 % ophthalmic solution Apply 1 drop to eye daily as needed (dry eyes).     [provider]  Propylene Glycol (SYSTANE BALANCE) 0.6 % SOLN Apply 1 drop to eye as needed.    [provider]  timolol (TIMOPTIC) 0.5 % ophthalmic solution Place 1 drop into both eyes at bedtime. 07/05/14   [provider]  vitamin B-12 (CYANOCOBALAMIN) 500 MCG tablet Take 500 mcg by mouth daily.    [provider]    Allergies as of 01/27/2017  . (No Known Allergies)    Family History  Problem Relation Age of Onset  . Rheum arthritis Mother   . Lung cancer Father   . Ulcers Father   . Hodgkin's lymphoma Sister   . AVM Daughter   . Breast cancer Paternal Grandmother     Social History   Socioeconomic History  . Marital status: Divorced    Spouse name: Not on file  . Number of children: Not on file  . Years of education: Not on file  . Highest education level: Not on file  Social Needs  . Financial resource strain: Not on file  . Food insecurity - worry: Not on file  .  Food insecurity - inability: Not on file  . Transportation needs - medical: Not on file  . Transportation needs - non-medical: Not on file  Occupational History  . Not on file  Tobacco Use  . Smoking status: Former Research scientist (life sciences)  . Smokeless tobacco: Never Used  . Tobacco comment: (08/29/15) quit 40 years ago  Substance and Sexual Activity  . Alcohol use: Yes    Alcohol/week: 1.8 oz    Types: 3 Glasses of wine per week    Comment: 1- 4  . Drug use: No  . Sexual activity: Not on file  Other Topics Concern  . Not on file  Social History Narrative  . Not on file    Review of Systems: See HPI, otherwise negative ROS  Physical Exam: BP 129/80   Pulse 83   Temp (!) 96.7 F  (35.9 C) (Tympanic)   Resp 18   Ht 5\' 8"  (1.727 m)   Wt 133 lb (60.3 kg)   SpO2 95%   BMI 20.22 kg/m  General:   Alert,  pleasant and cooperative in NAD Head:  Normocephalic and atraumatic. Neck:  Supple; no masses or thyromegaly. Lungs:  Clear throughout to auscultation, normal respiratory effort.    Heart:  +S1, +S2, Regular rate and rhythm, No edema. Abdomen:  Soft, nontender and nondistended. Normal bowel sounds, without guarding, and without rebound.   Neurologic:  Alert and  oriented x4;  grossly normal neurologically.  Impression/Plan: Courtney Willis is here for an colonoscopy to be performed for surveillance due to prior history of colon polyps   Risks, benefits, limitations, and alternatives regarding  colonoscopy have been reviewed with the patient.  Questions have been answered.  All parties agreeable.   Jonathon Bellows, MD  02/15/2017, 8:06 AM

## 2017-02-15 NOTE — Anesthesia Preprocedure Evaluation (Signed)
Anesthesia Evaluation  Patient identified by MRN, date of birth, ID band Patient awake    Reviewed: Allergy & Precautions, H&P , NPO status , Patient's Chart, lab work & pertinent test results, reviewed documented beta blocker date and time   History of Anesthesia Complications Negative for: history of anesthetic complications  Airway Mallampati: I  TM Distance: >3 FB Neck ROM: full    Dental  (+) Caps, Dental Advidsory Given   Pulmonary neg pulmonary ROS, former smoker,           Cardiovascular Exercise Tolerance: Good negative cardio ROS       Neuro/Psych negative neurological ROS  negative psych ROS   GI/Hepatic Neg liver ROS, GERD  ,  Endo/Other  negative endocrine ROS  Renal/GU negative Renal ROS  negative genitourinary   Musculoskeletal   Abdominal   Peds  Hematology negative hematology ROS (+)   Anesthesia Other Findings Past Medical History: 02/26/2008: Acid reflux No date: Arthritis 08/12/2014: Atrophy of vagina 08/12/2014: Avitaminosis D 02/16/2008: Bowel disease 08/12/2014: Bunion No date: Cancer [facility])     Comment:  Skin cancer- basal- upper arm , upper chest 08/12/2014: CN (constipation) 02/16/2008: DD (diverticular disease) 08/12/2014: Elevated liver enzymes 08/12/2014: Fibroids, submucosal     Comment:  of her lower lip  08/12/2014: Hypercholesteremia 08/12/2014: 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: Post menopausal syndrome   Reproductive/Obstetrics negative OB ROS                             Anesthesia Physical Anesthesia Plan  ASA: II  Anesthesia Plan: General   Post-op Pain Management:    Induction: Intravenous  PONV Risk Score and Plan: 3 and Propofol infusion  Airway Management Planned: Nasal Cannula  Additional Equipment:   Intra-op Plan:   Post-operative Plan:   Informed Consent: I have reviewed the patients History and Physical, chart, labs and discussed the  procedure including the risks, benefits and alternatives for the proposed anesthesia with the patient or authorized representative who has indicated his/her understanding and acceptance.   Dental Advisory Given  Plan Discussed with: Anesthesiologist, CRNA and Surgeon  Anesthesia Plan Comments:         Anesthesia Quick Evaluation

## 2017-02-15 NOTE — Progress Notes (Signed)
Patient s/p colonscopy - tolerated well Skin tear noted on left hand - bruising noted area approximated and  cleaned - guaze applied - tegaderm applied Patient aware - states her skin tears easily -  Patient alert and oriented and aware to care for area

## 2017-02-15 NOTE — Anesthesia Procedure Notes (Signed)
Performed by: Maalik Pinn, CRNA Pre-anesthesia Checklist: Patient identified, Emergency Drugs available, Suction available, Patient being monitored and Timeout performed Patient Re-evaluated:Patient Re-evaluated prior to induction Oxygen Delivery Method: Nasal cannula Induction Type: IV induction       

## 2017-02-15 NOTE — Op Note (Signed)
[facility] Gastroenterology Patient Name: Courtney Willis Procedure Date: 02/15/2017 7:57 AM MRN: [id number] Account #: [id number] Date of Birth: 13-Jan-1935 Admit Type: Outpatient Age: 82 Room: [facility] ENDO ROOM 1 Gender: Female Note Status: Finalized Procedure:            Colonoscopy Indications:          High risk colon cancer surveillance: Personal history                        of colonic polyps Providers:            Jonathon Bellows MD, MD Referring MD:         Mar Daring (Referring MD) Medicines:            Monitored Anesthesia Care Complications:        No immediate complications. Procedure:            Pre-Anesthesia Assessment:                       - Prior to the procedure, a History and Physical was                        performed, and patient medications, allergies and                        sensitivities were reviewed. The patient's tolerance of                        previous anesthesia was reviewed.                       - The risks and benefits of the procedure and the                        sedation options and risks were discussed with the                        patient. All questions were answered and informed                        consent was obtained.                       - ASA Grade Assessment: III - A patient with severe                        systemic disease.                       After obtaining informed consent, the colonoscope was                        passed under direct vision. Throughout the procedure,                        the patient's blood pressure, pulse, and oxygen                        saturations were monitored continuously. The                        Colonoscope  was introduced through the anus and                        advanced to the the cecum, identified by the                        appendiceal orifice, IC valve and transillumination.                        The colonoscopy was performed with ease. The patient                 tolerated the procedure well. The quality of the bowel                        preparation was good. Findings:      The perianal and digital rectal examinations were normal.      Multiple small-mouthed diverticula were found in the sigmoid colon.      Two sessile polyps were found in the descending colon and cecum. The       polyps were 3 to 4 mm in size. These polyps were removed with a cold       biopsy forceps. Resection and retrieval were complete.      Three sessile polyps were found in the ascending colon. The polyps were       5 to 7 mm in size. These polyps were removed with a cold snare.       Resection and retrieval were complete.      A 15 mm polyp was found in the cecum. The polyp was sessile.       Preparations were made for mucosal resection. Saline with methylene blue       was injected to raise the lesion. Snare mucosal resection was performed.       Resection and retrieval were complete. To prevent bleeding after mucosal       resection, two hemostatic clips were successfully placed. There was no       bleeding during, or at the end, of the procedure.      The exam was otherwise without abnormality on direct and retroflexion       views. Impression:           - Diverticulosis in the sigmoid colon.                       - Two 3 to 4 mm polyps in the descending colon and in                        the cecum, removed with a cold biopsy forceps. Resected                        and retrieved.                       - Three 5 to 7 mm polyps in the ascending colon,                        removed with a cold snare. Resected and retrieved.                       - One 15 mm polyp in the cecum, removed with  mucosal                        resection. Resected and retrieved. Clips were placed.                       - The examination was otherwise normal on direct and                        retroflexion views.                       - Mucosal resection was performed.  Resection and                        retrieval were complete. Recommendation:       - Discharge patient to home (with escort).                       - Resume previous diet.                       - No ibuprofen, naproxen, or other non-steroidal                        anti-inflammatory drugs for 6 weeks after polyp removal. Procedure Code(s):    --- Professional ---                       [phone], 59, Colonoscopy, flexible; with endoscopic                        mucosal resection                       [phone], Colonoscopy, flexible; with removal of tumor(s),                        polyp(s), or other lesion(s) by snare technique                       [id number], 30, Colonoscopy, flexible; with biopsy, single                        or multiple Diagnosis Code(s):    --- Professional ---                       Z86.010, Personal history of colonic polyps                       D12.4, Benign neoplasm of descending colon                       D12.0, Benign neoplasm of cecum                       D12.2, Benign neoplasm of ascending colon                       K57.30, Diverticulosis of large intestine without                        perforation or abscess without bleeding CPT copyright 2016 American [employer] Association. All rights  reserved. The codes documented in this report are preliminary and upon coder review may  be revised to meet current compliance requirements. Jonathon Bellows, MD Jonathon Bellows MD, MD 02/15/2017 8:49:22 AM This report has been signed electronically. Number of Addenda: 0 Note Initiated On: 02/15/2017 7:57 AM Scope Withdrawal Time: 0 hours 28 minutes 19 seconds  Total Procedure Duration: 0 hours 34 minutes 49 seconds       [facility]

## 2017-02-15 NOTE — Anesthesia Postprocedure Evaluation (Signed)
Anesthesia Post Note  Patient: Courtney Willis  Procedure(s) Performed: COLONOSCOPY WITH PROPOFOL (N/A )  Patient location during evaluation: Endoscopy Anesthesia Type: General Level of consciousness: awake and alert Pain management: pain level controlled Vital Signs Assessment: post-procedure vital signs reviewed and stable Respiratory status: spontaneous breathing, nonlabored ventilation, respiratory function stable and patient connected to nasal cannula oxygen Cardiovascular status: blood pressure returned to baseline and stable Postop Assessment: no apparent nausea or vomiting Anesthetic complications: no     Last Vitals:  Vitals:   02/15/17 0913 02/15/17 0924  BP: 133/76 138/73  Pulse: 72 67  Resp: 12 14  Temp:    SpO2: 100% 99%    Last Pain:  Vitals:   02/15/17 0730  TempSrc: Tympanic                 Martha Clan

## 2017-02-16 ENCOUNTER — Encounter: Payer: Self-pay | Admitting: Gastroenterology

## 2017-02-16 LAB — SURGICAL PATHOLOGY

## 2017-02-22 ENCOUNTER — Telehealth: Payer: Self-pay

## 2017-02-22 NOTE — Telephone Encounter (Signed)
Advised patient of results per Dr. Vicente Males.    - inform 5 pre cancerous adenomas were resected. Usually we do not recommend colon cancer screening -colonoscopy further at her age. IF she is still keen , at age 83 ie in 3 years she can discuss with her pcp and if still wants one can see [place] at the office to discuss further .   Patient states she would like a repeat in 3 years. Advised her to have PCP send referral.   Set up 3 year recall letter.

## 2017-06-10 ENCOUNTER — Telehealth: Payer: Self-pay

## 2017-06-10 NOTE — Telephone Encounter (Signed)
LMTCB to scheduled AWV after 07/01/17. -MM

## 2017-06-22 NOTE — Telephone Encounter (Signed)
Scheduled for 07/13/17 by ?

## 2017-07-13 ENCOUNTER — Ambulatory Visit (INDEPENDENT_AMBULATORY_CARE_PROVIDER_SITE_OTHER): Payer: Medicare Other

## 2017-07-13 VITALS — BP 132/64 | HR 72 | Temp 98.4°F | Ht 67.0 in | Wt 129.2 lb

## 2017-07-13 DIAGNOSIS — Z0000 Encounter for general adult medical examination without abnormal findings: Secondary | ICD-10-CM

## 2017-07-13 NOTE — Patient Instructions (Addendum)
[name] , Thank you for taking time to come for your Medicare Wellness Visit. I appreciate your ongoing commitment to your health goals. Please review the following plan we discussed and let me know if I can assist you in the future.   Screening recommendations/referrals: Colonoscopy: Up to date Mammogram: Up to date Bone Density: Up to date Recommended yearly ophthalmology/optometry visit for glaucoma screening and checkup Recommended yearly dental visit for hygiene and checkup  Vaccinations: Influenza vaccine: Up to date Pneumococcal vaccine: Up to date Tdap vaccine: Up to date Shingles vaccine: Pt declines today.     Advanced directives: Please bring a copy of your POA (Power of Attorney) and/or Living Will to your next appointment.   Conditions/risks identified: None.   Next appointment: 07/20/17 @ 10 AM with Fenton Malling.   Preventive Care 81 Years and Older, Female Preventive care refers to lifestyle choices and visits with your health care provider that can promote health and wellness. What does preventive care include?  A yearly physical exam. This is also called an annual well check.  Dental exams once or twice a year.  Routine eye exams. Ask your health care provider how often you should have your eyes checked.  Personal lifestyle choices, including:  Daily care of your teeth and gums.  Regular physical activity.  Eating a healthy diet.  Avoiding tobacco and drug use.  Limiting alcohol use.  Practicing safe sex.  Taking low-dose aspirin every day.  Taking vitamin and mineral supplements as recommended by your health care provider. What happens during an annual well check? The services and screenings done by your health care provider during your annual well check will depend on your age, overall health, lifestyle risk factors, and family history of disease. Counseling  Your health care provider may ask you questions about your:  Alcohol  use.  Tobacco use.  Drug use.  Emotional well-being.  Home and relationship well-being.  Sexual activity.  Eating habits.  History of falls.  Memory and ability to understand (cognition).  Work and work Statistician.  Reproductive health. Screening  You may have the following tests or measurements:  Height, weight, and BMI.  Blood pressure.  Lipid and cholesterol levels. These may be checked every 5 years, or more frequently if you are over 48 years old.  Skin check.  Lung cancer screening. You may have this screening every year starting at age 42 if you have a 30-pack-year history of smoking and currently smoke or have quit within the past 15 years.  Fecal occult blood test (FOBT) of the stool. You may have this test every year starting at age 13.  Flexible sigmoidoscopy or colonoscopy. You may have a sigmoidoscopy every 5 years or a colonoscopy every 10 years starting at age 63.  Hepatitis C blood test.  Hepatitis B blood test.  Sexually transmitted disease (STD) testing.  Diabetes screening. This is done by checking your blood sugar (glucose) after you have not eaten for a while (fasting). You may have this done every 1-3 years.  Bone density scan. This is done to screen for osteoporosis. You may have this done starting at age 28.  Mammogram. This may be done every 1-2 years. Talk to your health care provider about how often you should have regular mammograms. Talk with your health care provider about your test results, treatment options, and if necessary, the need for more tests. Vaccines  Your health care provider may recommend certain vaccines, such as:  Influenza vaccine. This  is recommended every year.  Tetanus, diphtheria, and acellular pertussis (Tdap, Td) vaccine. You may need a Td booster every 10 years.  Zoster vaccine. You may need this after age 32.  Pneumococcal 13-valent conjugate (PCV13) vaccine. One dose is recommended after age  58.  Pneumococcal polysaccharide (PPSV23) vaccine. One dose is recommended after age 15. Talk to your health care provider about which screenings and vaccines you need and how often you need them. This information is not intended to replace advice given to you by your health care provider. Make sure you discuss any questions you have with your health care provider. Document Released: 02/14/2015 Document Revised: 10/08/2015 Document Reviewed: 11/19/2014 Elsevier Interactive Patient Education  2017 [place] Prevention in the Home Falls can cause injuries. They can happen to people of all ages. There are many things you can do to make your home safe and to help prevent falls. What can I do on the outside of my home?  Regularly fix the edges of walkways and driveways and fix any cracks.  Remove anything that might make you trip as you walk through a door, such as a raised step or threshold.  Trim any bushes or trees on the path to your home.  Use bright outdoor lighting.  Clear any walking paths of anything that might make someone trip, such as rocks or tools.  Regularly check to see if handrails are loose or broken. Make sure that both sides of any steps have handrails.  Any raised decks and porches should have guardrails on the edges.  Have any leaves, snow, or ice cleared regularly.  Use sand or salt on walking paths during winter.  Clean up any spills in your garage right away. This includes oil or grease spills. What can I do in the bathroom?  Use night lights.  Install grab bars by the toilet and in the tub and shower. Do not use towel bars as grab bars.  Use non-skid mats or decals in the tub or shower.  If you need to sit down in the shower, use a plastic, non-slip stool.  Keep the floor dry. Clean up any water that spills on the floor as soon as it happens.  Remove soap buildup in the tub or shower regularly.  Attach bath mats securely with double-sided  non-slip rug tape.  Do not have throw rugs and other things on the floor that can make you trip. What can I do in the bedroom?  Use night lights.  Make sure that you have a light by your bed that is easy to reach.  Do not use any sheets or blankets that are too big for your bed. They should not hang down onto the floor.  Have a firm chair that has side arms. You can use this for support while you get dressed.  Do not have throw rugs and other things on the floor that can make you trip. What can I do in the kitchen?  Clean up any spills right away.  Avoid walking on wet floors.  Keep items that you use a lot in easy-to-reach places.  If you need to reach something above you, use a strong step stool that has a grab bar.  Keep electrical cords out of the way.  Do not use floor polish or wax that makes floors slippery. If you must use wax, use non-skid floor wax.  Do not have throw rugs and other things on the floor that can make you  trip. What can I do with my stairs?  Do not leave any items on the stairs.  Make sure that there are handrails on both sides of the stairs and use them. Fix handrails that are broken or loose. Make sure that handrails are as long as the stairways.  Check any carpeting to make sure that it is firmly attached to the stairs. Fix any carpet that is loose or worn.  Avoid having throw rugs at the top or bottom of the stairs. If you do have throw rugs, attach them to the floor with carpet tape.  Make sure that you have a light switch at the top of the stairs and the bottom of the stairs. If you do not have them, ask someone to add them for you. What else can I do to help prevent falls?  Wear shoes that:  Do not have high heels.  Have rubber bottoms.  Are comfortable and fit you well.  Are closed at the toe. Do not wear sandals.  If you use a stepladder:  Make sure that it is fully opened. Do not climb a closed stepladder.  Make sure that both  sides of the stepladder are locked into place.  Ask someone to hold it for you, if possible.  Clearly mark and make sure that you can see:  Any grab bars or handrails.  First and last steps.  Where the edge of each step is.  Use tools that help you move around (mobility aids) if they are needed. These include:  Canes.  Walkers.  Scooters.  Crutches.  Turn on the lights when you go into a dark area. Replace any light bulbs as soon as they burn out.  Set up your furniture so you have a clear path. Avoid moving your furniture around.  If any of your floors are uneven, fix them.  If there are any pets around you, be aware of where they are.  Review your medicines with your doctor. Some medicines can make you feel dizzy. This can increase your chance of falling. Ask your doctor what other things that you can do to help prevent falls. This information is not intended to replace advice given to you by your health care provider. Make sure you discuss any questions you have with your health care provider. Document Released: 11/14/2008 Document Revised: 06/26/2015 Document Reviewed: 02/22/2014 Elsevier Interactive Patient Education  2017 [employer].

## 2017-07-13 NOTE — Progress Notes (Signed)
Subjective:   Courtney Willis is a 83 y.o. female who presents for Medicare Annual (Subsequent) preventive examination.  Review of Systems:  N/A  Cardiac Risk Factors include: advanced age (>82men, >2 women)     Objective:     Vitals: BP 132/64 (BP Location: Right Arm)   Pulse 72   Temp 98.4 F (36.9 C) (Oral)   Ht 5\' 7"  (1.702 m)   Wt 129 lb 3.2 oz (58.6 kg)   BMI 20.24 kg/m   Body mass index is 20.24 kg/m.  Advanced Directives 07/13/2017 07/01/2016 08/26/2015 07/08/2015 08/12/2014  Does Patient Have a Medical Advance Directive? Yes Yes Yes Yes Yes  Type of Paramedic of [place];Living will Living will;Healthcare Power of [place];Living will [place];Living will [place];Living will  Does patient want to make changes to medical advance directive? - - - - No - Patient declined  Copy of [place] in Chart? No - copy requested Yes - - No - copy requested  Would patient like information on creating a medical advance directive? - - No - patient declined information - -    Tobacco Social History   Tobacco Use  Smoking Status Former Smoker  Smokeless Tobacco Never Used  Tobacco Comment   (08/29/15) quit 40 years ago     Counseling given: Not Answered Comment: (08/29/15) quit 40 years ago   Clinical Intake:  Pre-visit preparation completed: Yes  Pain : No/denies pain Pain Score: 0-No pain     Nutritional Status: BMI of 19-24  Normal Nutritional Risks: None Diabetes: No  How often do you need to have someone help you when you read instructions, pamphlets, or other written materials from your doctor or pharmacy?: 1 - Never  Interpreter Needed?: No  Information entered by :: [facility], LPN  Past Medical History:  Diagnosis Date  . Acid reflux 02/26/2008  . Arthritis   . 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 Avitaminosis D 08/12/2014  . Bowel disease 02/16/2008  .  Bunion 08/12/2014  . Cancer [facility])    Skin cancer- basal- upper arm , upper chest  . CN (constipation) 08/12/2014  . DD (diverticular disease) 02/16/2008  . Elevated liver enzymes 08/12/2014  . Fibroids, submucosal 08/12/2014   of her lower lip   . Hypercholesteremia 08/12/2014  . 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 Post menopausal syndrome 08/12/2014   Past Surgical History:  Procedure Laterality Date  . BUNIONECTOMY     05/2004, 2007  . CATARACT EXTRACTION     04/2005, 06/2005  . COLONOSCOPY W/ POLYPECTOMY    . COLONOSCOPY WITH PROPOFOL N/A 02/15/2017   Procedure: COLONOSCOPY WITH PROPOFOL;  Surgeon: Jonathon Bellows, MD;  Location: [facility] ENDOSCOPY;  Service: Gastroenterology;  Laterality: N/A;  . HYSTEROSCOPY  2011  . ORIF WRIST FRACTURE Right 08/30/2015   Procedure: OPEN REDUCTION INTERNAL FIXATION (ORIF) RIGHT WRIST FRACTURE AND REPAIR AS NECESSARY;  Surgeon: Roseanne Kaufman, MD;  Location: [place];  Service: Orthopedics;  Laterality: Right;  . UPPER GI ENDOSCOPY    . WRIST FRACTURE SURGERY Left    06/2006   Family History  Problem Relation Age of Onset  . Rheum arthritis Mother   . Lung cancer Father   . Ulcers Father   . Hodgkin's lymphoma Sister   . AVM Daughter   . Breast cancer Paternal Grandmother    Social History   Socioeconomic History  . Marital status: Divorced    Spouse name:  Not on file  . Number of children: 2  . Years of education: Not on file  . Highest education level: Master's degree (e.g., MA, MS, MEng, MEd, MSW, MBA)  Occupational History  . Occupation: retired  Scientific laboratory technician  . Financial resource strain: Not hard at all  . Food insecurity:    Worry: Never true    Inability: Never true  . Transportation needs:    Medical: No    Non-medical: No  Tobacco Use  . Smoking status: Former Research scientist (life sciences)  . Smokeless tobacco: Never Used  . Tobacco comment: (08/29/15) quit 40 years ago  Substance and Sexual Activity  . Alcohol use: Yes    Alcohol/week: 1.8 - 2.4 oz    Types: 3 -  4 Glasses of wine per week  . Drug use: No  . Sexual activity: Not on file  Lifestyle  . Physical activity:    Days per week: Not on file    Minutes per session: Not on file  . Stress: Not at all  Relationships  . Social connections:    Talks on phone: Not on file    Gets together: Not on file    Attends religious service: Not on file    Active member of club or organization: Not on file    Attends meetings of clubs or organizations: Not on file    Relationship status: Not on file  Other Topics Concern  . Not on file  Social History Narrative  . Not on file    Outpatient Encounter Medications as of 07/13/2017  Medication Sig  . Calcium Carbonate (CALCIUM 600 PO) Take 1,200 mg by mouth daily.  . chlorpheniramine (ALLERGY) 4 MG tablet Take 4 mg by mouth 2 (two) times daily as needed for allergies.  . Cholecalciferol (VITAMIN D3) 1000 UNITS CAPS Take 1 capsule by mouth daily.  . fluticasone (FLONASE) 50 MCG/ACT nasal spray Place 2 sprays into the nose daily.  . polyethylene glycol powder (GLYCOLAX/MIRALAX) powder Take 1 Container by mouth daily.   Marland Kitchen Propylene Glycol (SYSTANE BALANCE) 0.6 % SOLN Apply 1 drop to eye as needed.  . timolol (TIMOPTIC) 0.5 % ophthalmic solution Place 1 drop into both eyes at bedtime.  Marland Kitchen ascorbic acid (VITAMIN C) 500 MG tablet Take 1 tablet by mouth daily.  . fluorouracil (EFUDEX) 5 % cream APPLY TO LEFT CHEEK TWICE DAILY FOR 4 WEEKS  . polyvinyl alcohol-povidone (REFRESH) 1.4-0.6 % ophthalmic solution Apply 1 drop to eye daily as needed (dry eyes).   . vitamin B-12 (CYANOCOBALAMIN) 500 MCG tablet Take 500 mcg by mouth daily.   No facility-administered encounter medications on file as of 07/13/2017.     Activities of Daily Living In your present state of health, do you have any difficulty performing the following activities: 07/13/2017  Hearing? N  Vision? N  Difficulty concentrating or making decisions? N  Walking or climbing stairs? N  Dressing or  bathing? N  Doing errands, shopping? N  Preparing Food and eating ? N  Using the Toilet? N  In the past six months, have you accidently leaked urine? Y  Comment Occasionally, does not have to wear protection.   Do you have problems with loss of bowel control? N  Managing your Medications? N  Managing your Finances? N  Housekeeping or managing your Housekeeping? N  Some recent data might be hidden    Patient Care Team: Mar Daring, PA-C as PCP - General (Family Medicine) Dingeldein, Remo Lipps, MD as Consulting Physician (  Ophthalmology) Dasher, Rayvon Char, MD as Consulting Physician (Dermatology)    Assessment:   This is a routine wellness examination for [place].  Exercise Activities and Dietary recommendations Current Exercise Habits: Structured exercise class, Type of exercise: yoga, Time (Minutes): > 60(1.15 minutes), Frequency (Times/Week): 2, Weekly Exercise (Minutes/Week): 0, Intensity: Mild, Exercise limited by: None identified  Goals    None      Fall Risk Fall Risk  07/13/2017 07/01/2016 07/08/2015  Falls in the past year? No Yes Yes  Number falls in past yr: - 1 -  Injury with Fall? - Yes No  Comment - broken wrist -  Follow up - Falls prevention discussed -   Is the patient's home free of loose throw rugs in walkways, pet beds, electrical cords, etc?   yes      Grab bars in the bathroom? yes      Handrails on the stairs?   yes      Adequate lighting?   yes  Timed Get Up and Go performed: N/A  Depression Screen PHQ 2/9 Scores 07/13/2017 07/13/2017 07/01/2016 07/01/2016  PHQ - 2 Score 0 0 0 0  PHQ- 9 Score 1 - 0 -     Cognitive Function: Pt declined screening today.      6CIT Screen 07/01/2016  What Year? 0 points  What month? 0 points  What time? 0 points  Count back from 20 0 points  Months in reverse 0 points  Repeat phrase 0 points  Total Score 0    Immunization History  Administered Date(s) Administered  . Influenza, High Dose Seasonal PF  11/24/2016  . Pneumococcal Conjugate-13 07/02/2014  . Pneumococcal Polysaccharide-23 07/01/2004, 11/05/2004  . Td 01/14/2003, 04/08/2010  . Tdap 04/08/2010, 08/26/2015  . Zoster 03/11/2005    Qualifies for Shingles Vaccine? Due for Shingles vaccine. Declined my offer to administer today. Education has been provided regarding the importance of this vaccine. Pt has been advised to call her insurance company to determine her out of pocket expense. Advised she may also receive this vaccine at her local pharmacy or Health Dept. Verbalized acceptance and understanding.  Screening Tests Health Maintenance  Topic Date Due  . INFLUENZA VACCINE  09/01/2017  . COLONOSCOPY  02/16/2020  . TETANUS/TDAP  08/25/2025  . DEXA SCAN  Completed  . PNA vac Low Risk Adult  Completed    Cancer Screenings: Lung: Low Dose CT Chest recommended if Age 71-80 years, 30 pack-year currently smoking OR have quit w/in 15years. Patient does not qualify. Breast:  Up to date on Mammogram? Yes   Up to date of Bone Density/Dexa? Yes Colorectal: Up to date  Additional Screenings:  Hepatitis C Screening: N/A     Plan:  I have personally reviewed and addressed the Medicare Annual Wellness questionnaire and have noted the following in the patient's chart:  A. Medical and social history B. Use of alcohol, tobacco or illicit drugs  C. Current medications and supplements D. Functional ability and status E.  Nutritional status F.  Physical activity G. Advance directives H. List of other physicians I.  Hospitalizations, surgeries, and ER visits in previous 12 months J.  [place] such as hearing and vision if needed, cognitive and depression L. Referrals and appointments - none  In addition, I have reviewed and discussed with patient certain preventive protocols, quality metrics, and best practice recommendations. A written personalized care plan for preventive services as well as general preventive health  recommendations were provided to patient.  See attached scanned questionnaire for additional information.   Signed,  Fabio Neighbors, LPN Nurse Health Advisor   Nurse Recommendations: None.

## 2017-07-20 ENCOUNTER — Other Ambulatory Visit: Payer: Self-pay

## 2017-07-20 ENCOUNTER — Encounter: Payer: Self-pay | Admitting: Physician Assistant

## 2017-07-20 ENCOUNTER — Ambulatory Visit (INDEPENDENT_AMBULATORY_CARE_PROVIDER_SITE_OTHER): Payer: Medicare Other | Admitting: Physician Assistant

## 2017-07-20 VITALS — BP 136/80 | HR 68 | Temp 98.1°F | Resp 16 | Ht 67.0 in | Wt 127.0 lb

## 2017-07-20 DIAGNOSIS — E559 Vitamin D deficiency, unspecified: Secondary | ICD-10-CM | POA: Diagnosis not present

## 2017-07-20 DIAGNOSIS — E7800 Pure hypercholesterolemia, unspecified: Secondary | ICD-10-CM

## 2017-07-20 DIAGNOSIS — Z126 Encounter for screening for malignant neoplasm of bladder: Secondary | ICD-10-CM | POA: Diagnosis not present

## 2017-07-20 DIAGNOSIS — E038 Other specified hypothyroidism: Secondary | ICD-10-CM | POA: Insufficient documentation

## 2017-07-20 DIAGNOSIS — Z124 Encounter for screening for malignant neoplasm of cervix: Secondary | ICD-10-CM | POA: Diagnosis not present

## 2017-07-20 DIAGNOSIS — Z0000 Encounter for general adult medical examination without abnormal findings: Secondary | ICD-10-CM

## 2017-07-20 DIAGNOSIS — Z1231 Encounter for screening mammogram for malignant neoplasm of breast: Secondary | ICD-10-CM

## 2017-07-20 DIAGNOSIS — Z1239 Encounter for other screening for malignant neoplasm of breast: Secondary | ICD-10-CM

## 2017-07-20 DIAGNOSIS — E039 Hypothyroidism, unspecified: Secondary | ICD-10-CM | POA: Diagnosis not present

## 2017-07-20 LAB — POCT URINALYSIS DIPSTICK
APPEARANCE: NORMAL
BILIRUBIN UA: NEGATIVE
GLUCOSE UA: NEGATIVE
Ketones, UA: NEGATIVE
LEUKOCYTES UA: NEGATIVE
Nitrite, UA: NEGATIVE
Protein, UA: NEGATIVE
RBC UA: NEGATIVE
Spec Grav, UA: <=1.005 — AB (ref 1.010–1.025)
Urobilinogen, UA: 0.2 E.U./dL
pH, UA: 7.5 (ref 5.0–8.0)

## 2017-07-20 NOTE — Progress Notes (Signed)
Patient: Courtney Willis, Female    DOB: [birth year], 83 y.o.   MRN: [id number] Visit Date: 07/20/2017  Today's Provider: Mar Daring, PA-C   Chief Complaint  Patient presents with  . Medicare Wellness   Subjective:     Complete Physical Courtney Willis is a 83 y.o. female. She feels well. She reports exercising yoga 2 times week, walking daily. She reports she is sleeping well.  07/19/16 CPE 01/22/17 Mammogram-BI-RADS 1 11/22/16 BMD-osteoporosis 02/15/17 Colonoscopy-Diverticulosis, polyps -----------------------------------------------------------   Review of Systems  Constitutional: Negative.   HENT: Positive for rhinorrhea.   Eyes: Negative.   Respiratory: Negative.   Cardiovascular: Positive for leg swelling.  Gastrointestinal: Negative.   Endocrine: Negative.   Genitourinary: Negative.   Musculoskeletal: Negative.   Skin: Negative.   Allergic/Immunologic: Negative.   Neurological: Negative.   Hematological: Bruises/bleeds easily.  Psychiatric/Behavioral: Negative.     Social History   Socioeconomic History  . Marital status: Divorced    Spouse name: Not on file  . Number of children: 2  . Years of education: Not on file  . Highest education level: Master's degree (e.g., MA, MS, MEng, MEd, MSW, MBA)  Occupational History  . Occupation: retired  Scientific laboratory technician  . Financial resource strain: Not hard at all  . Food insecurity:    Worry: Never true    Inability: Never true  . Transportation needs:    Medical: No    Non-medical: No  Tobacco Use  . Smoking status: Former Research scientist (life sciences)  . Smokeless tobacco: Never Used  . Tobacco comment: (08/29/15) quit 40 years ago  Substance and Sexual Activity  . Alcohol use: Yes    Alcohol/week: 1.8 - 2.4 oz    Types: 3 - 4 Glasses of wine per week  . Drug use: No  . Sexual activity: Not on file  Lifestyle  . Physical activity:    Days per week: Not on file    Minutes per session: Not on file  . Stress: Not at  all  Relationships  . Social connections:    Talks on phone: Not on file    Gets together: Not on file    Attends religious service: Not on file    Active member of club or organization: Not on file    Attends meetings of clubs or organizations: Not on file    Relationship status: Not on file  . Intimate partner violence:    Fear of current or ex partner: Not on file    Emotionally abused: Not on file    Physically abused: Not on file    Forced sexual activity: Not on file  Other Topics Concern  . Not on file  Social History Narrative  . Not on file    Past Medical History:  Diagnosis Date  . Acid reflux 02/26/2008  . Arthritis   . 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 Avitaminosis D 08/12/2014  . Bowel disease 02/16/2008  . Bunion 08/12/2014  . Cancer [facility])    Skin cancer- basal- upper arm , upper chest  . CN (constipation) 08/12/2014  . DD (diverticular disease) 02/16/2008  . Elevated liver enzymes 08/12/2014  . Fibroids, submucosal 08/12/2014   of her lower lip   . Hypercholesteremia 08/12/2014  . 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 Post menopausal syndrome 08/12/2014     Patient Active Problem List   Diagnosis Date Noted  . Distal radius fracture, right 08/30/2015  . Bunion 08/12/2014  . Cataract 08/12/2014  .  CN (constipation) 08/12/2014  . Edema of foot 08/12/2014  . Fibroids, submucosal 08/12/2014  . Glaucoma 08/12/2014  . Hypercholesteremia 08/12/2014  . Coitalgia 08/12/2014  . 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 Avitaminosis D 08/12/2014  . Post menopausal syndrome 08/12/2014  . Alcohol use 08/06/2014  . Acid reflux 02/26/2008  . DD (diverticular disease) 02/16/2008  . Bowel disease 02/16/2008    Past Surgical History:  Procedure Laterality Date  . BUNIONECTOMY     05/2004, 2007  . CATARACT EXTRACTION     04/2005, 06/2005  . COLONOSCOPY W/ POLYPECTOMY    . COLONOSCOPY WITH PROPOFOL N/A 02/15/2017   Procedure: COLONOSCOPY WITH PROPOFOL;  Surgeon:  Jonathon Bellows, MD;  Location: [facility] ENDOSCOPY;  Service: Gastroenterology;  Laterality: N/A;  . HYSTEROSCOPY  2011  . ORIF WRIST FRACTURE Right 08/30/2015   Procedure: OPEN REDUCTION INTERNAL FIXATION (ORIF) RIGHT WRIST FRACTURE AND REPAIR AS NECESSARY;  Surgeon: Roseanne Kaufman, MD;  Location: [place];  Service: Orthopedics;  Laterality: Right;  . UPPER GI ENDOSCOPY    . WRIST FRACTURE SURGERY Left    06/2006    Her family history includes AVM in her daughter; Breast cancer in her paternal grandmother; Hodgkin's lymphoma in her sister; Lung cancer in her father; Rheum arthritis in her mother; Ulcers in her father.      Current Outpatient Medications:  .  ascorbic acid (VITAMIN C) 500 MG tablet, Take 1 tablet by mouth daily., Disp: , Rfl:  .  Calcium Carbonate (CALCIUM 600 PO), Take 1,200 mg by mouth daily., Disp: , Rfl:  .  chlorpheniramine (ALLERGY) 4 MG tablet, Take 4 mg by mouth 2 (two) times daily as needed for allergies., Disp: , Rfl:  .  Cholecalciferol (VITAMIN D3) 1000 UNITS CAPS, Take 1 capsule by mouth daily., Disp: , Rfl:  .  fluorouracil (EFUDEX) 5 % cream, APPLY TO LEFT CHEEK TWICE DAILY FOR 4 WEEKS, Disp: , Rfl: 0 .  fluticasone (FLONASE) 50 MCG/ACT nasal spray, Place 2 sprays into the nose daily., Disp: , Rfl:  .  polyethylene glycol powder (GLYCOLAX/MIRALAX) powder, Take 1 Container by mouth daily. , Disp: , Rfl:  .  polyvinyl alcohol-povidone (REFRESH) 1.4-0.6 % ophthalmic solution, Apply 1 drop to eye daily as needed (dry eyes). , Disp: , Rfl:  .  Propylene Glycol (SYSTANE BALANCE) 0.6 % SOLN, Apply 1 drop to eye as needed., Disp: , Rfl:  .  timolol (TIMOPTIC) 0.5 % ophthalmic solution, Place 1 drop into both eyes at bedtime., Disp: , Rfl: 5 .  vitamin B-12 (CYANOCOBALAMIN) 500 MCG tablet, Take 500 mcg by mouth daily., Disp: , Rfl:   Patient Care Team: Mar Daring, PA-C as PCP - General (Family Medicine) Dingeldein, Remo Lipps, MD as Consulting Physician  (Ophthalmology) Dasher, Rayvon Char, MD as Consulting Physician (Dermatology)     Objective:   Vitals: BP 136/80 (BP Location: Left Arm, Patient Position: Sitting, Cuff Size: Normal)   Pulse 68   Temp 98.1 F (36.7 C) (Oral)   Resp 16   Ht 5\' 7"  (1.702 m)   Wt 127 lb (57.6 kg)   SpO2 99%   BMI 19.89 kg/m   Physical Exam  Constitutional: She is oriented to person, place, and time. She appears well-developed and well-nourished. No distress.  HENT:  Head: Normocephalic and atraumatic.  Right Ear: Hearing, tympanic membrane, external ear and ear canal normal.  Left Ear: Hearing, tympanic membrane, external ear and ear canal normal.  Nose: Nose normal.  Mouth/Throat: Uvula is midline, oropharynx is clear and moist and mucous membranes are normal. No oropharyngeal exudate.  Eyes: Pupils are equal, round, and reactive to light. Conjunctivae and EOM are normal. Right eye exhibits no discharge. Left eye exhibits no discharge. No scleral icterus.  Neck: Normal range of motion. Neck supple. No JVD present. Carotid bruit is not present. No tracheal deviation present. No thyromegaly present.  Cardiovascular: Normal rate, regular rhythm, normal heart sounds and intact distal pulses. Exam reveals no gallop and no friction rub.  No murmur heard. Pulmonary/Chest: Effort normal and breath sounds normal. No respiratory distress. She has no wheezes. She has no rales. She exhibits no tenderness. Right breast exhibits no inverted nipple, no mass, no nipple discharge, no skin change and no tenderness. Left breast exhibits no inverted nipple, no mass, no nipple discharge, no skin change and no tenderness. No breast tenderness, discharge or bleeding. Breasts are symmetrical.  Abdominal: Soft. Bowel sounds are normal. She exhibits no distension and no mass. There is no tenderness. There is no rebound and no guarding. Hernia confirmed negative in the right inguinal area and confirmed negative in the left inguinal  area.  Genitourinary: Rectum normal, vagina normal and uterus normal. No breast tenderness, discharge or bleeding. Pelvic exam was performed with patient supine. There is no rash, tenderness, lesion or injury on the right labia. There is no rash, tenderness, lesion or injury on the left labia. Cervix exhibits no motion tenderness, no discharge and no friability. Right adnexum displays no mass, no tenderness and no fullness. Left adnexum displays no mass, no tenderness and no fullness. No erythema, tenderness or bleeding in the vagina. No signs of injury around the vagina. No vaginal discharge found.  Musculoskeletal: Normal range of motion. She exhibits no edema or tenderness.  Lymphadenopathy:    She has no cervical adenopathy.       Right: No inguinal adenopathy present.       Left: No inguinal adenopathy present.  Neurological: She is alert and oriented to person, place, and time. She has normal reflexes. No cranial nerve deficit. Coordination normal.  Skin: Skin is warm and dry. No rash noted. She is not diaphoretic.  Psychiatric: She has a normal mood and affect. Her behavior is normal. Judgment and thought content normal.  Vitals reviewed.   Activities of Daily Living In your present state of health, do you have any difficulty performing the following activities: 07/13/2017  Hearing? N  Vision? N  Difficulty concentrating or making decisions? N  Walking or climbing stairs? N  Dressing or bathing? N  Doing errands, shopping? N  Preparing Food and eating ? N  Using the Toilet? N  In the past six months, have you accidently leaked urine? Y  Comment Occasionally, does not have to wear protection.   Do you have problems with loss of bowel control? N  Managing your Medications? N  Managing your Finances? N  Housekeeping or managing your Housekeeping? N  Some recent data might be hidden    Fall Risk Assessment Fall Risk  07/13/2017 07/01/2016 07/08/2015  Falls in the past year? No Yes Yes   Number falls in past yr: - 1 -  Injury with Fall? - Yes No  Comment - broken wrist -  Follow up - Falls prevention discussed -     Depression Screen PHQ 2/9 Scores 07/13/2017 07/13/2017 07/01/2016 07/01/2016  PHQ - 2 Score 0 0 0 0  PHQ- 9 Score 1 - 0 -  Cognitive Testing - 6-CIT  Correct? Score   What year is it? yes 0 0 or 4  What month is it? yes 0 0 or 3  Memorize:    Pia Mau,  44,  [address],  [place],      What time is it? (within 1 hour) yes 0 0 or 3  Count backwards from 20 yes 0 0, 2, or 4  Name the months of the year yes 0 0, 2, or 4  Repeat name & address above yes 0 0, 2, 4, 6, 8, or 10       TOTAL SCORE  0/28   Interpretation:  Normal  Normal (0-7) Abnormal (8-28)       Assessment & Plan:    Annual Physical Reviewed patient's Family Medical History Reviewed and updated list of patient's medical providers Assessment of cognitive impairment was done Assessed patient's functional ability Established a written schedule for health screening [place] Completed and Reviewed  Exercise Activities and Dietary recommendations Goals    None      Immunization History  Administered Date(s) Administered  . Influenza, High Dose Seasonal PF 11/24/2016  . Pneumococcal Conjugate-13 07/02/2014  . Pneumococcal Polysaccharide-23 07/01/2004, 11/05/2004  . Td 01/14/2003, 04/08/2010  . Tdap 04/08/2010, 08/26/2015  . Zoster 03/11/2005    Health Maintenance  Topic Date Due  . INFLUENZA VACCINE  09/01/2017  . COLONOSCOPY  02/16/2020  . TETANUS/TDAP  08/25/2025  . DEXA SCAN  Completed  . PNA vac Low Risk Adult  Completed     Discussed health benefits of physical activity, and encouraged her to engage in regular exercise appropriate for her age and condition.    1. Annual physical exam Normal physical exam today. Will check labs as below and f/u pending lab results. If labs are stable and WNL she will not need to have these rechecked for  one year at her next annual physical exam. She is to call the office in the meantime if she has any acute issue, questions or concerns.  2. Avitaminosis D H/O this with known osteoporosis and postmenopausal. Has h/o pathologic fracture of right wrist. Will check labs as below and f/u pending results. - CBC with Differential/Platelet - Comprehensive metabolic panel - VITAMIN D 25 Hydroxy (Vit-D Deficiency, Fractures)  3. Hypercholesteremia Patient controls with diet and exercise. Declines statin. Will check labs as below and f/u pending results. - CBC with Differential/Platelet - Comprehensive metabolic panel - Lipid Panel With LDL/HDL Ratio  4. Breast cancer screening Breast exam today was normal. There is no family history of breast cancer. She does perform regular self breast exams. Mammogram was ordered as below. Information for [facility] clinic was given to patient so she may schedule her mammogram at her convenience. Not due unitl 01/2018.  - MM Digital Screening; Future  5. Cervical cancer screening Pap collected today. Will send as below and f/u pending results. - Pap IG and HPV (high risk) DNA detection  6. Bladder cancer screening Urine normal today.  - POCT Urinalysis Dipstick  7. Subclinical hypothyroidism H/O this last year but asymptomatic and declines treatment. Will check labs as below and f/u pending results. - TSH  ------------------------------------------------------------------------------------------------------------    Mar Daring, PA-C  [facility]

## 2017-07-20 NOTE — Patient Instructions (Signed)
Health Maintenance for Postmenopausal Women Menopause is a normal process in which your reproductive ability comes to an end. This process happens gradually over a span of months to years, usually between the ages of 22 and 9. Menopause is complete when you have missed 12 consecutive menstrual periods. It is important to talk with your health care provider about some of the most common conditions that affect postmenopausal women, such as heart disease, cancer, and bone loss (osteoporosis). Adopting a healthy lifestyle and getting preventive care can help to promote your health and wellness. Those actions can also lower your chances of developing some of these common conditions. What should I know about menopause? During menopause, you may experience a number of symptoms, such as:  Moderate-to-severe hot flashes.  Night sweats.  Decrease in sex drive.  Mood swings.  Headaches.  Tiredness.  Irritability.  Memory problems.  Insomnia.  Choosing to treat or not to treat menopausal changes is an individual decision that you make with your health care provider. What should I know about hormone replacement therapy and supplements? Hormone therapy products are effective for treating symptoms that are associated with menopause, such as hot flashes and night sweats. Hormone replacement carries certain risks, especially as you become older. If you are thinking about using estrogen or estrogen with progestin treatments, discuss the benefits and risks with your health care provider. What should I know about heart disease and stroke? Heart disease, heart attack, and stroke become more likely as you age. This may be due, in part, to the hormonal changes that your body experiences during menopause. These can affect how your body processes dietary fats, triglycerides, and cholesterol. Heart attack and stroke are both medical emergencies. There are many things that you can do to help prevent heart disease  and stroke:  Have your blood pressure checked at least every 1-2 years. High blood pressure causes heart disease and increases the risk of stroke.  If you are 53-22 years old, ask your health care provider if you should take aspirin to prevent a heart attack or a stroke.  Do not use any tobacco products, including cigarettes, chewing tobacco, or electronic cigarettes. If you need help quitting, ask your health care provider.  It is important to eat a healthy diet and maintain a healthy weight. ? Be sure to include plenty of vegetables, fruits, low-fat dairy products, and lean protein. ? Avoid eating foods that are high in solid fats, added sugars, or salt (sodium).  Get regular exercise. This is one of the most important things that you can do for your health. ? Try to exercise for at least 150 minutes each week. The type of exercise that you do should increase your heart rate and make you sweat. This is known as moderate-intensity exercise. ? Try to do strengthening exercises at least twice each week. Do these in addition to the moderate-intensity exercise.  Know your numbers.Ask your health care provider to check your cholesterol and your blood glucose. Continue to have your blood tested as directed by your health care provider.  What should I know about cancer screening? There are several types of cancer. Take the following steps to reduce your risk and to catch any cancer development as early as possible. Breast Cancer  Practice breast self-awareness. ? This means understanding how your breasts normally appear and feel. ? It also means doing regular breast self-exams. Let your health care provider know about any changes, no matter how small.  If you are 40  or older, have a clinician do a breast exam (clinical breast exam or CBE) every year. Depending on your age, family history, and medical history, it may be recommended that you also have a yearly breast X-ray (mammogram).  If you  have a family history of breast cancer, talk with your health care provider about genetic screening.  If you are at high risk for breast cancer, talk with your health care provider about having an MRI and a mammogram every year.  Breast cancer (BRCA) gene test is recommended for women who have family members with BRCA-related cancers. Results of the assessment will determine the need for genetic counseling and BRCA1 and for BRCA2 testing. BRCA-related cancers include these types: ? Breast. This occurs in males or females. ? Ovarian. ? Tubal. This may also be called fallopian tube cancer. ? Cancer of the abdominal or pelvic lining (peritoneal cancer). ? Prostate. ? Pancreatic.  Cervical, Uterine, and Ovarian Cancer Your health care provider may recommend that you be screened regularly for cancer of the pelvic organs. These include your ovaries, uterus, and vagina. This screening involves a pelvic exam, which includes checking for microscopic changes to the surface of your cervix (Pap test).  For women ages 21-65, health care providers may recommend a pelvic exam and a Pap test every three years. For women ages 79-65, they may recommend the Pap test and pelvic exam, combined with testing for human papilloma virus (HPV), every five years. Some types of HPV increase your risk of cervical cancer. Testing for HPV may also be done on women of any age who have unclear Pap test results.  Other health care providers may not recommend any screening for nonpregnant women who are considered low risk for pelvic cancer and have no symptoms. Ask your health care provider if a screening pelvic exam is right for you.  If you have had past treatment for cervical cancer or a condition that could lead to cancer, you need Pap tests and screening for cancer for at least 20 years after your treatment. If Pap tests have been discontinued for you, your risk factors (such as having a new sexual partner) need to be  reassessed to determine if you should start having screenings again. Some women have medical problems that increase the chance of getting cervical cancer. In these cases, your health care provider may recommend that you have screening and Pap tests more often.  If you have a family history of uterine cancer or ovarian cancer, talk with your health care provider about genetic screening.  If you have vaginal bleeding after reaching menopause, tell your health care provider.  There are currently no reliable tests available to screen for ovarian cancer.  Lung Cancer Lung cancer screening is recommended for adults 69-62 years old who are at high risk for lung cancer because of a history of smoking. A yearly low-dose CT scan of the lungs is recommended if you:  Currently smoke.  Have a history of at least 30 pack-years of smoking and you currently smoke or have quit within the past 15 years. A pack-year is smoking an average of one pack of cigarettes per day for one year.  Yearly screening should:  Continue until it has been 15 years since you quit.  Stop if you develop a health problem that would prevent you from having lung cancer treatment.  Colorectal Cancer  This type of cancer can be detected and can often be prevented.  Routine colorectal cancer screening usually begins at  age 42 and continues through age 45.  If you have risk factors for colon cancer, your health care provider may recommend that you be screened at an earlier age.  If you have a family history of colorectal cancer, talk with your health care provider about genetic screening.  Your health care provider may also recommend using home test kits to check for hidden blood in your stool.  A small camera at the end of a tube can be used to examine your colon directly (sigmoidoscopy or colonoscopy). This is done to check for the earliest forms of colorectal cancer.  Direct examination of the colon should be repeated every  5-10 years until age 71. However, if early forms of precancerous polyps or small growths are found or if you have a family history or genetic risk for colorectal cancer, you may need to be screened more often.  Skin Cancer  Check your skin from head to toe regularly.  Monitor any moles. Be sure to tell your health care provider: ? About any new moles or changes in moles, especially if there is a change in a mole's shape or color. ? If you have a mole that is larger than the size of a pencil eraser.  If any of your family members has a history of skin cancer, especially at a young age, talk with your health care provider about genetic screening.  Always use sunscreen. Apply sunscreen liberally and repeatedly throughout the day.  Whenever you are outside, protect yourself by wearing long sleeves, pants, a wide-brimmed hat, and sunglasses.  What should I know about osteoporosis? Osteoporosis is a condition in which bone destruction happens more quickly than new bone creation. After menopause, you may be at an increased risk for osteoporosis. To help prevent osteoporosis or the bone fractures that can happen because of osteoporosis, the following is recommended:  If you are 46-71 years old, get at least 1,000 mg of calcium and at least 600 mg of vitamin D per day.  If you are older than age 55 but younger than age 65, get at least 1,200 mg of calcium and at least 600 mg of vitamin D per day.  If you are older than age 54, get at least 1,200 mg of calcium and at least 800 mg of vitamin D per day.  Smoking and excessive alcohol intake increase the risk of osteoporosis. Eat foods that are rich in calcium and vitamin D, and do weight-bearing exercises several times each week as directed by your health care provider. What should I know about how menopause affects my mental health? Depression may occur at any age, but it is more common as you become older. Common symptoms of depression  include:  Low or sad mood.  Changes in sleep patterns.  Changes in appetite or eating patterns.  Feeling an overall lack of motivation or enjoyment of activities that you previously enjoyed.  Frequent crying spells.  Talk with your health care provider if you think that you are experiencing depression. What should I know about immunizations? It is important that you get and maintain your immunizations. These include:  Tetanus, diphtheria, and pertussis (Tdap) booster vaccine.  Influenza every year before the flu season begins.  Pneumonia vaccine.  Shingles vaccine.  Your health care provider may also recommend other immunizations. This information is not intended to replace advice given to you by your health care provider. Make sure you discuss any questions you have with your health care provider. Document Released: 03/12/2005  Document Revised: 08/08/2015 Document Reviewed: 10/22/2014 Elsevier Interactive Patient Education  2018 [employer]vier Inc.  

## 2017-07-21 ENCOUNTER — Telehealth: Payer: Self-pay

## 2017-07-21 LAB — CBC WITH DIFFERENTIAL/PLATELET
BASOS ABS: 0 10*3/uL (ref 0.0–0.2)
Basos: 0 %
EOS (ABSOLUTE): 0 10*3/uL (ref 0.0–0.4)
Eos: 1 %
Hematocrit: 39.9 % (ref 34.0–46.6)
Hemoglobin: 12.9 g/dL (ref 11.1–15.9)
Immature Grans (Abs): 0 10*3/uL (ref 0.0–0.1)
Immature Granulocytes: 0 %
LYMPHS ABS: 1.5 10*3/uL (ref 0.7–3.1)
Lymphs: 29 %
MCH: 30.2 pg (ref 26.6–33.0)
MCHC: 32.3 g/dL (ref 31.5–35.7)
MCV: 93 fL (ref 79–97)
MONOS ABS: 0.5 10*3/uL (ref 0.1–0.9)
Monocytes: 9 %
NEUTROS ABS: 3 10*3/uL (ref 1.4–7.0)
Neutrophils: 61 %
Platelets: 299 10*3/uL (ref 150–450)
RBC: 4.27 x10E6/uL (ref 3.77–5.28)
RDW: 13.4 % (ref 12.3–15.4)
WBC: 5 10*3/uL (ref 3.4–10.8)

## 2017-07-21 LAB — COMPREHENSIVE METABOLIC PANEL
ALK PHOS: 59 IU/L (ref 39–117)
ALT: 11 IU/L (ref 0–32)
AST: 15 IU/L (ref 0–40)
Albumin/Globulin Ratio: 1.8 (ref 1.2–2.2)
Albumin: 4.2 g/dL (ref 3.5–4.7)
BILIRUBIN TOTAL: 0.7 mg/dL (ref 0.0–1.2)
BUN / CREAT RATIO: 16 (ref 12–28)
BUN: 14 mg/dL (ref 8–27)
CHLORIDE: 101 mmol/L (ref 96–106)
CO2: 26 mmol/L (ref 20–29)
CREATININE: 0.89 mg/dL (ref 0.57–1.00)
Calcium: 9.5 mg/dL (ref 8.7–10.3)
GFR calc Af Amer: 70 mL/min/{1.73_m2} (ref >59)
GFR calc non Af Amer: 61 mL/min/{1.73_m2} (ref >59)
GLUCOSE: 85 mg/dL (ref 65–99)
Globulin, Total: 2.3 g/dL (ref 1.5–4.5)
Potassium: 4.4 mmol/L (ref 3.5–5.2)
SODIUM: 141 mmol/L (ref 134–144)
Total Protein: 6.5 g/dL (ref 6.0–8.5)

## 2017-07-21 LAB — LIPID PANEL WITH LDL/HDL RATIO
CHOLESTEROL TOTAL: 206 mg/dL — AB (ref 100–199)
HDL: 83 mg/dL (ref >39)
LDL CALC: 106 mg/dL — AB (ref 0–99)
LDl/HDL Ratio: 1.3 ratio (ref 0.0–3.2)
TRIGLYCERIDES: 87 mg/dL (ref 0–149)
VLDL CHOLESTEROL CAL: 17 mg/dL (ref 5–40)

## 2017-07-21 LAB — VITAMIN D 25 HYDROXY (VIT D DEFICIENCY, FRACTURES): VIT D 25 HYDROXY: 42.4 ng/mL (ref 30.0–100.0)

## 2017-07-21 LAB — TSH: TSH: 5.32 u[IU]/mL — ABNORMAL HIGH (ref 0.450–4.500)

## 2017-07-21 NOTE — Telephone Encounter (Signed)
-----   Message from Mar Daring, PA-C sent at 07/21/2017  8:52 AM EDT ----- All labs are within normal limits and stable.  Thanks! -JB

## 2017-07-21 NOTE — Telephone Encounter (Signed)
Patient advised as directed below.  Thanks,  -Bruin Bolger 

## 2017-07-25 LAB — PAP IG AND HPV HIGH-RISK
HPV, HIGH-RISK: NEGATIVE
PAP SMEAR COMMENT: 0

## 2017-07-26 ENCOUNTER — Telehealth: Payer: Self-pay

## 2017-07-26 NOTE — Telephone Encounter (Signed)
LMTCB  Thanks,  -Joseline 

## 2017-07-26 NOTE — Telephone Encounter (Signed)
-----   Message from Mar Daring, [place] sent at 07/26/2017  8:15 AM EDT ----- Pap was unsatisfactory most likely due to too much lubrication. If patient desires to have repeated at no charge she can schedule at her convenience for pap only.

## 2017-07-29 NOTE — Telephone Encounter (Signed)
Patient advised as directed below. Patient states she wants to talk to you first before she repeat the pap about having vaginal pain. Patient states that she is in anew relationship now and with just the attempt of having sex it hurts.  Thanks,  -Joseline

## 2017-08-01 ENCOUNTER — Encounter: Payer: Self-pay | Admitting: Physician Assistant

## 2017-08-01 ENCOUNTER — Ambulatory Visit: Payer: Medicare Other | Admitting: Physician Assistant

## 2017-08-01 VITALS — BP 130/88 | HR 78 | Temp 98.4°F | Resp 16 | Wt 126.4 lb

## 2017-08-01 DIAGNOSIS — Z124 Encounter for screening for malignant neoplasm of cervix: Secondary | ICD-10-CM | POA: Diagnosis not present

## 2017-08-01 DIAGNOSIS — N952 Postmenopausal atrophic vaginitis: Secondary | ICD-10-CM | POA: Diagnosis not present

## 2017-08-01 NOTE — Patient Instructions (Signed)
Atrophic Vaginitis Atrophic vaginitis is when the tissues that line the vagina become dry and thin. This is caused by a drop in estrogen. Estrogen helps:  To keep the vagina moist.  To make a clear fluid that helps: ? To lubricate the vagina for sex. ? To protect the vagina from infection.  If the lining of the vagina is dry and thin, it may:  Make sex painful. It may also cause bleeding.  Cause a feeling of: ? Burning. ? Irritation. ? Itchiness.  Make an exam of your vagina painful. It may also cause bleeding.  Make you lose interest in sex.  Cause a burning feeling when you pee.  Make your vaginal fluid (discharge) brown or yellow.  For some women, there are no symptoms. This condition is most common in women who do not get their regular menstrual periods anymore (menopause). This often starts when a woman is 45-55 years old. Follow these instructions at home:  Take medicines only as told by your doctor. Do not use any herbal or alternative medicines unless your doctor says it is okay.  Use over-the-counter products for dryness only as told by your doctor. These include: ? Creams. ? Lubricants. ? Moisturizers.  Do not douche.  Do not use products that can make your vagina dry. These include: ? Scented feminine sprays. ? Scented tampons. ? Scented soaps.  If it hurts to have sex, tell your sexual partner. Contact a doctor if:  Your discharge looks different than normal.  Your vagina has an unusual smell.  You have new symptoms.  Your symptoms do not get better with treatment.  Your symptoms get worse. This information is not intended to replace advice given to you by your health care provider. Make sure you discuss any questions you have with your health care provider. Document Released: 07/07/2007 Document Revised: 06/26/2015 Document Reviewed: 01/09/2014 Elsevier Interactive Patient Education  2018 Elsevier Inc.  

## 2017-08-01 NOTE — Progress Notes (Signed)
Patient: Courtney Willis Female    DOB: 11/29/1934   83 y.o.   MRN: [id number] Visit Date: 08/01/2017  Today's Provider: Mar Daring, PA-C   Chief Complaint  Patient presents with  . Vaginal Pain   Subjective:    Vaginal Pain  This is a new problem. The current episode started more than 1 year ago. The problem occurs daily. Associated symptoms include painful intercourse. Associated symptoms comments: Reports is vaginal dryness. She reports is uncomfortable with intercourse.She reports that she has used lubricants.. The symptoms are aggravated by intercourse. The treatment provided no relief. She is sexually active.      No Known Allergies   Current Outpatient Medications:  .  Calcium Carbonate (CALCIUM 600 PO), Take 1,200 mg by mouth daily., Disp: , Rfl:  .  chlorpheniramine (ALLERGY) 4 MG tablet, Take 4 mg by mouth 2 (two) times daily as needed for allergies., Disp: , Rfl:  .  Cholecalciferol (VITAMIN D3) 1000 UNITS CAPS, Take 1 capsule by mouth daily., Disp: , Rfl:  .  fluticasone (FLONASE) 50 MCG/ACT nasal spray, Place 2 sprays into the nose daily., Disp: , Rfl:  .  polyethylene glycol powder (GLYCOLAX/MIRALAX) powder, Take 1 Container by mouth daily. , Disp: , Rfl:  .  Propylene Glycol (SYSTANE BALANCE) 0.6 % SOLN, Apply 1 drop to eye as needed., Disp: , Rfl:  .  timolol (TIMOPTIC) 0.5 % ophthalmic solution, Place 1 drop into both eyes at bedtime., Disp: , Rfl: 5 .  ascorbic acid (VITAMIN C) 500 MG tablet, Take 1 tablet by mouth daily., Disp: , Rfl:  .  polyvinyl alcohol-povidone (REFRESH) 1.4-0.6 % ophthalmic solution, Apply 1 drop to eye daily as needed (dry eyes). , Disp: , Rfl:   Review of Systems  Genitourinary: Positive for vaginal pain.    Social History   Tobacco Use  . Smoking status: Former Research scientist (life sciences)  . Smokeless tobacco: Never Used  . Tobacco comment: (08/29/15) quit 40 years ago  Substance Use Topics  . Alcohol use: Yes    Alcohol/week: 1.8 - 2.4 oz      Types: 3 - 4 Glasses of wine per week   Objective:   BP 130/88 (BP Location: Left Arm, Patient Position: Sitting, Cuff Size: Normal)   Pulse 78   Temp 98.4 F (36.9 C) (Oral)   Resp 16   Wt 126 lb 6.4 oz (57.3 kg)   BMI 19.80 kg/m    Physical Exam  Constitutional: She appears well-developed and well-nourished. No distress.  HENT:  Head: Normocephalic and atraumatic.  Eyes: EOM are normal.  Cardiovascular: Normal rate, regular rhythm and normal heart sounds.  Pulmonary/Chest: Effort normal. No respiratory distress.  Genitourinary: Vagina normal and uterus normal. There is no rash, tenderness, lesion or injury on the right labia. There is no rash, tenderness, lesion or injury on the left labia. Cervix exhibits no motion tenderness and no discharge. No erythema in the vagina. No vaginal discharge found.  Skin: She is not diaphoretic.  Vitals reviewed.     Assessment & Plan:     1. Encounter for Papanicolaou smear of cervix Pap collected today. Will send as below and f/u pending results. - Pap IG and HPV (high risk) DNA detection  2. Vaginal atrophy Going to try other OTC lubricants. If still having symptoms will try topical estrogen. She will call if she desires to start it.       Mar Daring, PA-C  [facility]

## 2017-08-04 LAB — PAP IG AND HPV HIGH-RISK: PAP SMEAR COMMENT: 0

## 2017-08-04 LAB — HPV, LOW VOLUME (REFLEX): HPV low volume reflex: POSITIVE — AB

## 2017-08-05 ENCOUNTER — Telehealth: Payer: Self-pay

## 2017-08-05 NOTE — Telephone Encounter (Signed)
Patient advised as below.  

## 2017-08-05 NOTE — Telephone Encounter (Signed)
-----   Message from Mar Daring, [place] sent at 08/05/2017  9:32 AM EDT ----- Pap is normal.  Will repeat in 3 years if desired.

## 2017-11-23 ENCOUNTER — Encounter: Payer: Self-pay | Admitting: Physician Assistant

## 2017-11-23 ENCOUNTER — Ambulatory Visit: Payer: Medicare Other | Admitting: Physician Assistant

## 2017-11-23 ENCOUNTER — Other Ambulatory Visit: Payer: Self-pay | Admitting: Physician Assistant

## 2017-11-23 VITALS — BP 110/80 | HR 80 | Temp 97.8°F | Resp 20 | Wt 126.0 lb

## 2017-11-23 DIAGNOSIS — J309 Allergic rhinitis, unspecified: Secondary | ICD-10-CM | POA: Diagnosis not present

## 2017-11-23 DIAGNOSIS — T7840XS Allergy, unspecified, sequela: Secondary | ICD-10-CM

## 2017-11-23 MED ORDER — FLUTICASONE PROPIONATE 50 MCG/ACT NA SUSP: 2.0000 | Freq: Every day | NASAL | 6 refills | Status: DC

## 2017-11-23 MED ORDER — AZELASTINE-FLUTICASONE 137-50 MCG/ACT NA SUSP: 1.0000 | Freq: Two times a day (BID) | NASAL | 1 refills | Status: DC

## 2017-11-23 MED ORDER — AZELASTINE HCL 137 MCG/SPRAY NA SOLN: 1.0000 | Freq: Two times a day (BID) | NASAL | 0 refills | Status: DC

## 2017-11-23 NOTE — Progress Notes (Signed)
Patient: Courtney Willis Female    DOB: September 16, 1934   83 y.o.   MRN: [id number] Visit Date: 11/23/2017  Today's Provider: Trinna Post, PA-C   Chief Complaint  Patient presents with  . URI   Subjective:    HPI Upper Respiratory Infection: Patient complains of symptoms of a URI, possible sinusitis. Symptoms include congestion and cough. Onset of symptoms was 1 week ago, gradually worsening since that time. She also c/o cough described as productive of yellow sputum, post nasal drip, productive cough with  yellow colored sputum and sneezing for the past 1 week .  She is drinking plenty of fluids. Evaluation to date: none. Treatment to date: cough suppressants, decongestants and nasal steroids.       No Known Allergies   Current Outpatient Medications:  .  ascorbic acid (VITAMIN C) 500 MG tablet, Take 1 tablet by mouth daily., Disp: , Rfl:  .  Calcium Carbonate (CALCIUM 600 PO), Take 1,200 mg by mouth daily., Disp: , Rfl:  .  Cholecalciferol (VITAMIN D3) 1000 UNITS CAPS, Take 1 capsule by mouth daily., Disp: , Rfl:  .  fluticasone (FLONASE) 50 MCG/ACT nasal spray, Place 2 sprays into the nose daily., Disp: , Rfl:  .  polyethylene glycol powder (GLYCOLAX/MIRALAX) powder, Take 1 Container by mouth daily. , Disp: , Rfl:  .  polyvinyl alcohol-povidone (REFRESH) 1.4-0.6 % ophthalmic solution, Apply 1 drop to eye daily as needed (dry eyes). , Disp: , Rfl:  .  Propylene Glycol (SYSTANE BALANCE) 0.6 % SOLN, Apply 1 drop to eye as needed., Disp: , Rfl:  .  timolol (TIMOPTIC) 0.5 % ophthalmic solution, Place 1 drop into both eyes at bedtime., Disp: , Rfl: 5 .  chlorpheniramine (ALLERGY) 4 MG tablet, Take 4 mg by mouth 2 (two) times daily as needed for allergies., Disp: , Rfl:   Review of Systems  Constitutional: Negative.   HENT: Positive for congestion, postnasal drip, rhinorrhea, sinus pressure, sinus pain, sneezing and sore throat.   Respiratory: Positive for cough.     Social  History   Tobacco Use  . Smoking status: Former Research scientist (life sciences)  . Smokeless tobacco: Never Used  . Tobacco comment: (08/29/15) quit 40 years ago  Substance Use Topics  . Alcohol use: Yes    Alcohol/week: 3.0 - 4.0 standard drinks    Types: 3 - 4 Glasses of wine per week   Objective:   BP 110/80 (BP Location: Left Arm, Patient Position: Sitting, Cuff Size: Normal)   Pulse 80   Temp 97.8 F (36.6 C) (Oral)   Resp 20   Wt 126 lb (57.2 kg)   SpO2 97%   BMI 19.73 kg/m  Vitals:   11/23/17 0844  BP: 110/80  Pulse: 80  Resp: 20  Temp: 97.8 F (36.6 C)  TempSrc: Oral  SpO2: 97%  Weight: 126 lb (57.2 kg)     Physical Exam  Constitutional: She appears well-developed and well-nourished.  HENT:  Right Ear: External ear normal.  Left Ear: External ear normal.  Mouth/Throat: Oropharynx is clear and moist. No oropharyngeal exudate.  Eyes: Right eye exhibits discharge. Left eye exhibits discharge.  Neck: Neck supple.  Cardiovascular: Normal rate and regular rhythm.  Pulmonary/Chest: Effort normal and breath sounds normal. No respiratory distress. She has no rales.  Lymphadenopathy:    She has no cervical adenopathy.  Skin: Skin is warm and dry.  Psychiatric: She has a normal mood and affect. Her behavior is normal.  Assessment & Plan:     1. Allergic rhinitis, unspecified seasonality, unspecified trigger  - Azelastine-Fluticasone 137-50 MCG/ACT SUSP; Place 1 spray into the nose 2 (two) times daily.  Dispense: 23 g; Refill: 1  Return if symptoms worsen or fail to improve.  The entirety of the information documented in the History of Present Illness, Review of Systems and Physical Exam were personally obtained by me. Portions of this information were initially documented by Lynford Humphrey, CMA and reviewed by me for thoroughness and accuracy.        Trinna Post, PA-C  [facility]

## 2017-11-23 NOTE — Patient Instructions (Signed)
Upper Respiratory Infection, Adult Most upper respiratory infections (URIs) are caused by a virus. A URI affects the nose, throat, and upper air passages. The most common type of URI is often called "the common cold." Follow these instructions at home:  Take medicines only as told by your doctor.  Gargle warm saltwater or take cough drops to comfort your throat as told by your doctor.  Use a warm mist humidifier or inhale steam from a shower to increase air moisture. This may make it easier to breathe.  Drink enough fluid to keep your pee (urine) clear or pale yellow.  Eat soups and other clear broths.  Have a healthy diet.  Rest as needed.  Go back to work when your fever is gone or your doctor says it is okay. ? You may need to stay home longer to avoid giving your URI to others. ? You can also wear a face mask and wash your hands often to prevent spread of the virus.  Use your inhaler more if you have asthma.  Do not use any tobacco products, including cigarettes, chewing tobacco, or electronic cigarettes. If you need help quitting, ask your doctor. Contact a doctor if:  You are getting worse, not better.  Your symptoms are not helped by medicine.  You have chills.  You are getting more short of breath.  You have brown or red mucus.  You have yellow or brown discharge from your nose.  You have pain in your face, especially when you bend forward.  You have a fever.  You have puffy (swollen) neck glands.  You have pain while swallowing.  You have white areas in the back of your throat. Get help right away if:  You have very bad or constant: ? Headache. ? Ear pain. ? Pain in your forehead, behind your eyes, and over your cheekbones (sinus pain). ? Chest pain.  You have long-lasting (chronic) lung disease and any of the following: ? Wheezing. ? Long-lasting cough. ? Coughing up blood. ? A change in your usual mucus.  You have a stiff neck.  You have  changes in your: ? Vision. ? Hearing. ? Thinking. ? Mood. This information is not intended to replace advice given to you by your health care provider. Make sure you discuss any questions you have with your health care provider. Document Released: 07/07/2007 Document Revised: 09/21/2015 Document Reviewed: 04/25/2013 Elsevier Interactive Patient Education  2018 Elsevier Inc.  

## 2017-11-30 ENCOUNTER — Ambulatory Visit
Admission: RE | Admit: 2017-11-30 | Discharge: 2017-11-30 | Disposition: A | Discharge status: Home / Self Care | Payer: Medicare Other | Source: Ambulatory Visit | Attending: Physician Assistant | Admitting: Physician Assistant

## 2017-11-30 DIAGNOSIS — Z1239 Encounter for other screening for malignant neoplasm of breast: Secondary | ICD-10-CM

## 2017-11-30 DIAGNOSIS — Z1231 Encounter for screening mammogram for malignant neoplasm of breast: Secondary | ICD-10-CM | POA: Insufficient documentation

## 2017-12-01 ENCOUNTER — Telehealth: Payer: Self-pay

## 2017-12-01 NOTE — Telephone Encounter (Signed)
LMTCB

## 2017-12-01 NOTE — Telephone Encounter (Signed)
Pt returned missed call to [place]. Please call pt back.  Thanks, [employer]

## 2017-12-01 NOTE — Telephone Encounter (Signed)
-----   Message from Trinna Post, [place] sent at 12/01/2017 11:02 AM EDT ----- Normal mammo, repeat 1-2 years

## 2017-12-05 NOTE — Telephone Encounter (Signed)
Patient advised.

## 2017-12-15 ENCOUNTER — Other Ambulatory Visit: Payer: Self-pay | Admitting: Physician Assistant

## 2017-12-15 DIAGNOSIS — T7840XS Allergy, unspecified, sequela: Secondary | ICD-10-CM

## 2018-01-13 ENCOUNTER — Inpatient Hospital Stay (HOSPITAL_COMMUNITY)
Admission: EM | Admit: 2018-01-13 | Discharge: 2018-01-18 | DRG: 064 | Disposition: A | Discharge status: Rehabilitation Facility | Payer: Medicare Other | Attending: Neurology | Admitting: Neurology

## 2018-01-13 ENCOUNTER — Encounter (HOSPITAL_COMMUNITY): Payer: Self-pay | Admitting: Pharmacy Technician

## 2018-01-13 ENCOUNTER — Emergency Department (HOSPITAL_COMMUNITY): Payer: Medicare Other

## 2018-01-13 DIAGNOSIS — I619 Nontraumatic intracerebral hemorrhage, unspecified: Secondary | ICD-10-CM | POA: Diagnosis not present

## 2018-01-13 DIAGNOSIS — I69398 Other sequelae of cerebral infarction: Secondary | ICD-10-CM | POA: Diagnosis not present

## 2018-01-13 DIAGNOSIS — R481 Agnosia: Secondary | ICD-10-CM | POA: Diagnosis not present

## 2018-01-13 DIAGNOSIS — E871 Hypo-osmolality and hyponatremia: Secondary | ICD-10-CM | POA: Diagnosis not present

## 2018-01-13 DIAGNOSIS — Z79899 Other long term (current) drug therapy: Secondary | ICD-10-CM

## 2018-01-13 DIAGNOSIS — I5031 Acute diastolic (congestive) heart failure: Secondary | ICD-10-CM | POA: Diagnosis not present

## 2018-01-13 DIAGNOSIS — K219 Gastro-esophageal reflux disease without esophagitis: Secondary | ICD-10-CM | POA: Diagnosis present

## 2018-01-13 DIAGNOSIS — R269 Unspecified abnormalities of gait and mobility: Secondary | ICD-10-CM | POA: Diagnosis not present

## 2018-01-13 DIAGNOSIS — R29710 NIHSS score 10: Secondary | ICD-10-CM | POA: Diagnosis present

## 2018-01-13 DIAGNOSIS — E785 Hyperlipidemia, unspecified: Secondary | ICD-10-CM | POA: Diagnosis not present

## 2018-01-13 DIAGNOSIS — Z85828 Personal history of other malignant neoplasm of skin: Secondary | ICD-10-CM

## 2018-01-13 DIAGNOSIS — I611 Nontraumatic intracerebral hemorrhage in hemisphere, cortical: Secondary | ICD-10-CM | POA: Diagnosis not present

## 2018-01-13 DIAGNOSIS — R4701 Aphasia: Secondary | ICD-10-CM | POA: Diagnosis present

## 2018-01-13 DIAGNOSIS — I69390 Apraxia following cerebral infarction: Secondary | ICD-10-CM | POA: Diagnosis not present

## 2018-01-13 DIAGNOSIS — T7840XS Allergy, unspecified, sequela: Secondary | ICD-10-CM

## 2018-01-13 DIAGNOSIS — I639 Cerebral infarction, unspecified: Secondary | ICD-10-CM

## 2018-01-13 DIAGNOSIS — I351 Nonrheumatic aortic (valve) insufficiency: Secondary | ICD-10-CM | POA: Diagnosis not present

## 2018-01-13 DIAGNOSIS — E7800 Pure hypercholesterolemia, unspecified: Secondary | ICD-10-CM | POA: Diagnosis present

## 2018-01-13 DIAGNOSIS — D62 Acute posthemorrhagic anemia: Secondary | ICD-10-CM | POA: Diagnosis not present

## 2018-01-13 DIAGNOSIS — I680 Cerebral amyloid angiopathy: Secondary | ICD-10-CM | POA: Diagnosis present

## 2018-01-13 DIAGNOSIS — Z87891 Personal history of nicotine dependence: Secondary | ICD-10-CM

## 2018-01-13 DIAGNOSIS — R0989 Other specified symptoms and signs involving the circulatory and respiratory systems: Secondary | ICD-10-CM | POA: Diagnosis not present

## 2018-01-13 DIAGNOSIS — R29810 Facial weakness: Secondary | ICD-10-CM | POA: Diagnosis present

## 2018-01-13 DIAGNOSIS — Z720 Tobacco use: Secondary | ICD-10-CM | POA: Diagnosis not present

## 2018-01-13 DIAGNOSIS — I609 Nontraumatic subarachnoid hemorrhage, unspecified: Secondary | ICD-10-CM | POA: Diagnosis present

## 2018-01-13 DIAGNOSIS — G936 Cerebral edema: Secondary | ICD-10-CM | POA: Diagnosis present

## 2018-01-13 LAB — CBC
HCT: 40.1 % (ref 36.0–46.0)
Hemoglobin: 12.5 g/dL (ref 12.0–15.0)
MCH: 30 pg (ref 26.0–34.0)
MCHC: 31.2 g/dL (ref 30.0–36.0)
MCV: 96.4 fL (ref 80.0–100.0)
Platelets: 278 10*3/uL (ref 150–400)
RBC: 4.16 MIL/uL (ref 3.87–5.11)
RDW: 12.8 % (ref 11.5–15.5)
WBC: 8.6 10*3/uL (ref 4.0–10.5)
nRBC: 0 % (ref 0.0–0.2)

## 2018-01-13 LAB — I-STAT TROPONIN, ED: Troponin i, poc: 0 ng/mL (ref 0.00–0.08)

## 2018-01-13 LAB — I-STAT CHEM 8, ED
BUN: 20 mg/dL (ref 8–23)
Calcium, Ion: 1.16 mmol/L (ref 1.15–1.40)
Chloride: 103 mmol/L (ref 98–111)
Creatinine, Ser: 0.6 mg/dL (ref 0.44–1.00)
Glucose, Bld: 127 mg/dL — ABNORMAL HIGH (ref 70–99)
HCT: 40 % (ref 36.0–46.0)
Hemoglobin: 13.6 g/dL (ref 12.0–15.0)
Potassium: 4.1 mmol/L (ref 3.5–5.1)
Sodium: 137 mmol/L (ref 135–145)
TCO2: 27 mmol/L (ref 22–32)

## 2018-01-13 LAB — COMPREHENSIVE METABOLIC PANEL
ALT: 16 U/L (ref 0–44)
AST: 21 U/L (ref 15–41)
Albumin: 4 g/dL (ref 3.5–5.0)
Alkaline Phosphatase: 56 U/L (ref 38–126)
Anion gap: 13 (ref 5–15)
BUN: 19 mg/dL (ref 8–23)
CO2: 23 mmol/L (ref 22–32)
Calcium: 9.2 mg/dL (ref 8.9–10.3)
Chloride: 102 mmol/L (ref 98–111)
Creatinine, Ser: 0.82 mg/dL (ref 0.44–1.00)
GFR calc Af Amer: >60 mL/min (ref >60)
GFR calc non Af Amer: >60 mL/min (ref >60)
Glucose, Bld: 133 mg/dL — ABNORMAL HIGH (ref 70–99)
Potassium: 4.1 mmol/L (ref 3.5–5.1)
Sodium: 138 mmol/L (ref 135–145)
Total Bilirubin: 0.8 mg/dL (ref 0.3–1.2)
Total Protein: 6.7 g/dL (ref 6.5–8.1)

## 2018-01-13 LAB — DIFFERENTIAL
Abs Immature Granulocytes: 0.03 10*3/uL (ref 0.00–0.07)
Basophils Absolute: 0 10*3/uL (ref 0.0–0.1)
Basophils Relative: 0 %
Eosinophils Absolute: 0 10*3/uL (ref 0.0–0.5)
Eosinophils Relative: 0 %
Immature Granulocytes: 0 %
Lymphocytes Relative: 10 %
Lymphs Abs: 0.8 10*3/uL (ref 0.7–4.0)
Monocytes Absolute: 0.8 10*3/uL (ref 0.1–1.0)
Monocytes Relative: 9 %
Neutro Abs: 6.9 10*3/uL (ref 1.7–7.7)
Neutrophils Relative %: 81 %

## 2018-01-13 LAB — PROTIME-INR
INR: 0.93
Prothrombin Time: 12.4 seconds (ref 11.4–15.2)

## 2018-01-13 LAB — APTT: aPTT: 25 seconds (ref 24–36)

## 2018-01-13 MED ORDER — POLYVINYL ALCOHOL-POVIDONE 1.4-0.6 % OP SOLN: 1.0000 [drp] | Freq: Every day | OPHTHALMIC | Status: DC | PRN

## 2018-01-13 MED ORDER — ACETAMINOPHEN 650 MG RE SUPP: 650.0000 mg | RECTAL | Status: DC | PRN

## 2018-01-13 MED ORDER — PROPYLENE GLYCOL 0.6 % OP SOLN: 1.0000 [drp] | OPHTHALMIC | Status: DC | PRN

## 2018-01-13 MED ORDER — SODIUM CHLORIDE 0.9 % IV SOLN
INTRAVENOUS | Status: DC
  Administered 2018-01-14 – 2018-01-15 (×3): via INTRAVENOUS

## 2018-01-13 MED ORDER — SENNOSIDES-DOCUSATE SODIUM 8.6-50 MG PO TABS
1.0000 | ORAL_TABLET | Freq: Two times a day (BID) | ORAL | Status: DC
  Administered 2018-01-14 – 2018-01-17 (×7): 1 via ORAL
  Filled 2018-01-13 (×7): qty 1

## 2018-01-13 MED ORDER — TIMOLOL MALEATE 0.5 % OP SOLN
1.0000 [drp] | Freq: Every day | OPHTHALMIC | Status: DC
  Administered 2018-01-14 – 2018-01-17 (×5): 1 [drp] via OPHTHALMIC
  Filled 2018-01-13: qty 5

## 2018-01-13 MED ORDER — CLEVIDIPINE BUTYRATE 0.5 MG/ML IV EMUL: 0.0000 mg/h | INTRAVENOUS | Status: DC

## 2018-01-13 MED ORDER — ACETAMINOPHEN 325 MG PO TABS
650.0000 mg | ORAL_TABLET | ORAL | Status: DC | PRN
  Administered 2018-01-15: 650 mg via ORAL
  Filled 2018-01-13: qty 2

## 2018-01-13 MED ORDER — CLEVIDIPINE BUTYRATE 0.5 MG/ML IV EMUL
0.0000 mg/h | INTRAVENOUS | Status: DC
  Administered 2018-01-14: 12 mg/h via INTRAVENOUS
  Filled 2018-01-13: qty 50
  Filled 2018-01-13: qty 100

## 2018-01-13 MED ORDER — STROKE: EARLY STAGES OF RECOVERY BOOK
Freq: Once | Status: AC
  Administered 2018-01-14: 06:00:00
  Filled 2018-01-13: qty 1

## 2018-01-13 MED ORDER — PANTOPRAZOLE SODIUM 40 MG IV SOLR
40.0000 mg | Freq: Every day | INTRAVENOUS | Status: DC
  Administered 2018-01-14: 40 mg via INTRAVENOUS
  Filled 2018-01-13: qty 40

## 2018-01-13 MED ORDER — CLEVIDIPINE BUTYRATE 0.5 MG/ML IV EMUL
0.0000 mg/h | INTRAVENOUS | Status: DC
  Administered 2018-01-13: 1 mg/h via INTRAVENOUS
  Filled 2018-01-13 (×4): qty 50

## 2018-01-13 MED ORDER — ACETAMINOPHEN 160 MG/5ML PO SOLN: 650.0000 mg | ORAL | Status: DC | PRN

## 2018-01-13 MED ORDER — POLYVINYL ALCOHOL 1.4 % OP SOLN
1.0000 [drp] | OPHTHALMIC | Status: DC | PRN
  Filled 2018-01-13: qty 15

## 2018-01-13 NOTE — ED Notes (Signed)
Pt returned from MRI, family at the bedside

## 2018-01-13 NOTE — ED Triage Notes (Signed)
Pt arrives via [facility] with reports of aphasia, AMS. LKW unknown at this time. Pt was supposed to go to dinner with a friend, when friend came to pick her up, pt was half dressed and aphasic. Per EMS pt was initially following commands. 151/76 with EMS. HR 80. 20g Rwrist, 18g RAC.

## 2018-01-13 NOTE — H&P (Addendum)
NEURO HOSPITALIST  h&P   Requesting Physician: Dr. Wilson Singer    Chief Complaint:  Aphasia  History obtained from:  Chart/ EMS  HPI:                                                                                                                                         Courtney Willis is an 83 y.o. female  With PMH of HLD, skin cancer who presented to [facility] ED as a code stroke for aphasia.  Per EMS patient has missed phone calls all day. A friend knocked on patients door this morning at 1030 am and she did not answer. This afternoon about 1700 a friend came to take her to dinner and she was found wandering around the house half dressed. Unknown time of LSW. No prior history of stroke.   ED course:  CTH: Acute intra-axial hemorrhage anterior superior left frontal gyrus (44 ml) with 4-5 mm of rightward midline shift.  BP: 146/70 BG: 127  Date last known well: Unable to determine Time last known well: Unable to determine tPA Given: No: hemorrhage        Modified Rankin: Rankin Score=0 NIHSS:10  Intracerebral Hemorrhage (ICH) Score Total:  3  Past Medical History:  Diagnosis Date  . Acid reflux 02/26/2008  . Arthritis   . 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 Avitaminosis D 08/12/2014  . Bowel disease 02/16/2008  . Bunion 08/12/2014  . Cancer [facility])    Skin cancer- basal- upper arm , upper chest  . CN (constipation) 08/12/2014  . DD (diverticular disease) 02/16/2008  . Elevated liver enzymes 08/12/2014  . Fibroids, submucosal 08/12/2014   of her lower lip   . Hypercholesteremia 08/12/2014  . 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 Post menopausal syndrome 08/12/2014    Past Surgical History:  Procedure Laterality Date  . BUNIONECTOMY     05/2004, 2007  . CATARACT EXTRACTION     04/2005, 06/2005  . COLONOSCOPY W/ POLYPECTOMY    . COLONOSCOPY WITH PROPOFOL N/A 02/15/2017   Procedure: COLONOSCOPY WITH PROPOFOL;  Surgeon: Jonathon Bellows, MD;  Location: [facility]COPY;   Service: Gastroenterology;  Laterality: N/A;  . HYSTEROSCOPY  2011  . ORIF WRIST FRACTURE Right 08/30/2015   Procedure: OPEN REDUCTION INTERNAL FIXATION (ORIF) RIGHT WRIST FRACTURE AND REPAIR AS NECESSARY;  Surgeon: Roseanne Kaufman, MD;  Location: [facility];  Service: Orthopedics;  Laterality: Right;  . UPPER GI ENDOSCOPY    . WRIST FRACTURE SURGERY Left    06/2006    Family History  Problem Relation Age of Onset  . Rheum arthritis Mother   . Lung cancer Father   .  Ulcers Father   . Hodgkin's lymphoma Sister   . AVM Daughter   . Breast cancer Paternal Grandmother          Social History:  reports that she has quit smoking. She has never used smokeless tobacco. She reports current alcohol use of about 3.0 - 4.0 standard drinks of alcohol per week. She reports that she does not use drugs.  Allergies: No Known Allergies  Medications:                                                                                                                          Current Facility-Administered Medications  Medication Dose Route Frequency Provider Last Rate Last Dose  . clevidipine (CLEVIPREX) infusion 0.5 mg/mL  0-21 mg/hr Intravenous Continuous Kerney Elbe, MD 8 mL/hr at 01/13/18 1837 4 mg/hr at 01/13/18 1837   Current Outpatient Medications  Medication Sig Dispense Refill  . ascorbic acid (VITAMIN C) 500 MG tablet Take 1 tablet by mouth daily.    Marland Kitchen azelastine (ASTELIN) 0.1 % nasal spray PLACE 1 SPRAY INTO THE NOSE 2 (TWO) TIMES DAILY. 48 mL 1  . Azelastine-Fluticasone 137-50 MCG/ACT SUSP Place 1 spray into the nose 2 (two) times daily. 23 g 1  . Calcium Carbonate (CALCIUM 600 PO) Take 1,200 mg by mouth daily.    . chlorpheniramine (ALLERGY) 4 MG tablet Take 4 mg by mouth 2 (two) times daily as needed for allergies.    . Cholecalciferol (VITAMIN D3) 1000 UNITS CAPS Take 1 capsule by mouth daily.    . fluticasone (FLONASE) 50 MCG/ACT nasal spray Place 2 sprays into both nostrils daily. 16 g 6  .  polyethylene glycol powder (GLYCOLAX/MIRALAX) powder Take 1 Container by mouth daily.     . polyvinyl alcohol-povidone (REFRESH) 1.4-0.6 % ophthalmic solution Apply 1 drop to eye daily as needed (dry eyes).     . Propylene Glycol (SYSTANE BALANCE) 0.6 % SOLN Apply 1 drop to eye as needed.    . timolol (TIMOPTIC) 0.5 % ophthalmic solution Place 1 drop into both eyes at bedtime.  5    ROS:                                                                                                                                        unobtainable from patient due to aphasia   General Examination:  Blood pressure (!) 155/74, temperature 98.6 F (37 C), temperature source Oral, resp. rate 16, SpO2 100 %.  HEENT-  Normocephalic, no lesions, without obvious abnormality.  Normal external eye and conjunctiva. Cardiovascular- S1-S2 audible, pulses palpable throughout  Lungs-Respirations unlabored.  Saturations within normal limits on RA Extremities- Warm and well perfused   Neurological Examination Mental Status: Awake and alert, globally aphasic and abulic. Little to no spontaneous movements.  Does not follow any commands.   Cranial Nerves:  Blinks to threat and gazes at people in the room. PERRL.  Ptosis not present, able to track examiner around the room No definite facial droop.  Does not phonate.  Head at midline.   Motor/Sensory: Right : Upper extremity   5/5  Left:     Upper extremity   5/5  Lower extremity   5/5   Lower extremity   5/5 Normal tone throughout; no atrophy noted Patient able to move all 4 extremities spontaneously. Does not follow commands. Able to mimic some movements. Slightly more hesitant to lift right leg than left leg, otherwise nonfocal motor exam.  Deep Tendon Reflexes: 2+ and symmetric throughout Plantars: Right: upgoing  Left: upgoing Cerebellar: No ataxia noted with  spontaneous movements. Gait: deferred   Lab Results: Basic Metabolic Panel: Recent Labs  Lab 01/13/18 1823  NA 137  K 4.1  CL 103  GLUCOSE 127*  BUN 20  CREATININE 0.60    CBC: Recent Labs  Lab 01/13/18 1810  WBC 8.6  NEUTROABS 6.9  HGB 12.5  HCT 40.1  MCV 96.4  PLT 278    CBG: No results for input(s): GLUCAP in the last 168 hours.  Imaging: Ct Head Code Stroke Wo Contrast  Result Date: 01/13/2018 CLINICAL DATA:  Code stroke.  83 year old female with aphasia. EXAM: CT HEAD WITHOUT CONTRAST TECHNIQUE: Contiguous axial images were obtained from the base of the skull through the vertex without intravenous contrast. COMPARISON:  None. FINDINGS: Brain: Hyperdense somewhat flame shaped intra-axial hemorrhage in the anterior superior frontal gyrus with extension into the left subarachnoid space. Intra-axial hemorrhage component estimated at 55 x 42 x 38 millimeters (approximately 44 milliliters). Surrounding edema. Regional mass effect with 4-5 millimeters of rightward midline shift and mild mass effect on the left frontal horn. No intraventricular hemorrhage. Basilar cisterns remain normal. No other intracranial blood. Gray-white matter differentiation elsewhere appears normal. Vascular: Calcified atherosclerosis at the skull base. No suspicious intracranial vascular hyperdensity. Skull: Osteopenia.  No acute osseous abnormality identified. Sinuses/Orbits: Visualized paranasal sinuses and mastoids are well pneumatized. Other: No acute orbit or scalp soft tissue findings. ASPECTS [facility] Stroke Program Early CT Score) Total score (0-10 with 10 being normal): Not applicable, acute hemorrhage. IMPRESSION: 1. Acute intra-axial hemorrhage in the anterior superior frontal gyrus with extension into the left subarachnoid space. Intra-axial hemorrhage volume estimated at 44 mL. 2. Surrounding edema and regional mass effect with 4-5 mm of rightward midline shift. 3. No intraventricular extension  or ventriculomegaly. 4. ASPECTS is not applicable, acute hemorrhage. Dr. Cheral Marker was paged via the [facility] messaging system to discuss the above at 6:27 pmon 01/13/2018. Electronically Signed   By: Genevie Ann M.D.   On: 01/13/2018 18:27     Laurey Morale, MSN, NP-C [facility] [phone] 01/13/2018, 6:31 PM    Assessment: 83 y.o. female With PMH of HLD, skin cancer who presented to [facility] ED as a code stroke for aphasia and confusion. 1. CT head reveals an acute intra-axial hemorrhage involving the anterior superior left frontal  gyrus (44 ml) with 4-5 mm of rightward midline shift. Also noted is local subarachnoid extension of blood products.  2. Exam reveals global aphasia and abulia.    Plan:  -Admit to ICU under the Neurology service  -ICH Score: 3 -ICH Volume: 35ml -BP control goal SYS< 140. Cleviprex drip started to keep SBP < 140. -PT/OT  -Frequent neuro checks -Repeat CT head in 12 hours -MRI brain tomorrow afternoon -Cardiac telemetry -No antiplatelet medications or anticoagulants. DVT prophylaxis with SCDs -Full code per family -May need hot salts if repeat CT shows significant worsening, or if she worsens clinically  -Monitor for Dysphagia following ICH. NPO until cleared by speech   RESP No acute issues Continue to monitor  CV -Aggressive BP control, goal SBP < 140 -cleviprex   GI/GU -Gentle hydration   HEME -Monitor -transfuse for hgb < 7   ENDO -goal HgbA1c < 7  ID Rule out aspiration PNA -CXR -NPO -Monitor  Prophylaxis DVT: SCD GI: doc/senna  60 minutes spent in the emergent Neurological evaluation and management of this critically ill patient with left frontal ICH.  I have seen and examined the patient. I have formulated the assessment and plan. Exam reveals an abulic patient with dense global aphasia I have discussed the patient's condition regarding her left frontal ICH, in addition to her prognosis with the family. Patient's  daughter expressed understanding and agreement with the plan.  Electronically signed: Dr. Kerney Elbe

## 2018-01-13 NOTE — Code Documentation (Signed)
Per EMS a friend found her partially dressed this evening about 5pm.  LKW is currently unknown.  A friend did call her this morning about 10:30am but she did not answer.  Code Stroke called at 1748  Patient arrival at [facility] via [facility] Stat labs and head CT done.  Dr Cheral Marker at bedside to assess patient.  ICH.  NIHSS 10, unable to answer questions or commands, mute, right facial droop.  BP 155/74  Cleviprex gtt started.  Hand off with ED RN.

## 2018-01-13 NOTE — ED Provider Notes (Signed)
[facility] EMERGENCY DEPARTMENT Provider Note   CSN: [id number] Arrival date & time: 01/13/18  1810   An emergency department physician performed an initial assessment on this suspected stroke patient at 1810.  History   Chief Complaint Chief Complaint  Patient presents with  . Code Stroke    HPI  Courtney Willis is a 83 y.o. female.  HPI   83 year old female with altered mental status/aphasia.   Supposed to go to dinner with her friend was found to be aphasic when they went to pick her up. Unclear when last known normal.  Reportedly she normally lives independently.  No reported trauma.  No blood thinners per med list.  Past Medical History:  Diagnosis Date  . Acid reflux 02/26/2008  . Arthritis   . 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 Avitaminosis D 08/12/2014  . Bowel disease 02/16/2008  . Bunion 08/12/2014  . Cancer [facility])    Skin cancer- basal- upper arm , upper chest  . CN (constipation) 08/12/2014  . DD (diverticular disease) 02/16/2008  . Elevated liver enzymes 08/12/2014  . Fibroids, submucosal 08/12/2014   of her lower lip   . Hypercholesteremia 08/12/2014  . 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 Post menopausal syndrome 08/12/2014    Patient Active Problem List   Diagnosis Date Noted  . Subclinical hypothyroidism 07/20/2017  . Bunion 08/12/2014  . Cataract 08/12/2014  . CN (constipation) 08/12/2014  . Edema of foot 08/12/2014  . Fibroids, submucosal 08/12/2014  . Glaucoma 08/12/2014  . Hypercholesteremia 08/12/2014  . Coitalgia 08/12/2014  . 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 Avitaminosis D 08/12/2014  . Post menopausal syndrome 08/12/2014  . Alcohol use 08/06/2014  . Acid reflux 02/26/2008  . DD (diverticular disease) 02/16/2008  . Bowel disease 02/16/2008    Past Surgical History:  Procedure Laterality Date  . BUNIONECTOMY     05/2004, 2007  . CATARACT EXTRACTION     04/2005, 06/2005  . COLONOSCOPY W/ POLYPECTOMY    .  COLONOSCOPY WITH PROPOFOL N/A 02/15/2017   Procedure: COLONOSCOPY WITH PROPOFOL;  Surgeon: Jonathon Bellows, MD;  [facility] END[facility]COPY;  Service: Gastroenterology;  Laterality: N/A;  . HYSTEROSCOPY  2011  . ORIF WRIST FRACTURE Right 08/30/2015   Procedure: OPEN REDUCTION INTERNAL FIXATION (ORIF) RIGHT WRIST FRACTURE AND REPAIR AS NECESSARY;  Surgeon: Roseanne Kaufman, MD;  Location: [facility];  Service: Orthopedics;  Laterality: Right;  . UPPER GI ENDOSCOPY    . WRIST FRACTURE SURGERY Left    06/2006     OB History   No obstetric history on file.      Home Medications    Prior to Admission medications   Medication Sig Start Date End Date Taking? Authorizing Provider  ascorbic acid (VITAMIN C) 500 MG tablet Take 1 tablet by mouth daily.    [provider]  azelastine (ASTELIN) 0.1 % nasal spray PLACE 1 SPRAY INTO THE NOSE 2 (TWO) TIMES DAILY. 12/16/17   Mar Daring, PA-C  Azelastine-Fluticasone 137-50 MCG/ACT SUSP Place 1 spray into the nose 2 (two) times daily. 11/23/17   Trinna Post, PA-C  Calcium Carbonate (CALCIUM 600 PO) Take 1,200 mg by mouth daily.    [provider]  chlorpheniramine (ALLERGY) 4 MG tablet Take 4 mg by mouth 2 (two) times daily as needed for allergies.    [provider]  Cholecalciferol (VITAMIN D3) 1000 UNITS CAPS Take 1 capsule by mouth daily.    [provider]  fluticasone (  FLONASE) 50 MCG/ACT nasal spray Place 2 sprays into both nostrils daily. 11/23/17   Trinna Post, PA-C  polyethylene glycol powder (GLYCOLAX/MIRALAX) powder Take 1 Container by mouth daily.     [provider]  polyvinyl alcohol-povidone (REFRESH) 1.4-0.6 % ophthalmic solution Apply 1 drop to eye daily as needed (dry eyes).     [provider]  Propylene Glycol (SYSTANE BALANCE) 0.6 % SOLN Apply 1 drop to eye as needed.    [provider]  timolol (TIMOPTIC) 0.5 % ophthalmic solution Place 1 drop into both eyes at  bedtime. 07/05/14   [provider]    Family History Family History  Problem Relation Age of Onset  . Rheum arthritis Mother   . Lung cancer Father   . Ulcers Father   . Hodgkin's lymphoma Sister   . AVM Daughter   . Breast cancer Paternal Grandmother     Social History Social History   Tobacco Use  . Smoking status: Former Research scientist (life sciences)  . Smokeless tobacco: Never Used  . Tobacco comment: (08/29/15) quit 40 years ago  Substance Use Topics  . Alcohol use: Yes    Alcohol/week: 3.0 - 4.0 standard drinks    Types: 3 - 4 Glasses of wine per week  . Drug use: No     Allergies   Patient has no known allergies.   Review of Systems Review of Systems  Level 5 caveat because of aphasia. Physical Exam Updated Vital Signs BP (!) 155/74 (BP Location: Right Arm)   Temp 98.6 F (37 C) (Oral)   Resp 16   SpO2 100%   Physical Exam Vitals signs and nursing note reviewed.  Constitutional:      General: She is not in acute distress.    Appearance: She is well-developed.  HENT:     Head: Normocephalic and atraumatic.  Eyes:     General:        Right eye: No discharge.        Left eye: No discharge.     Conjunctiva/sclera: Conjunctivae normal.  Neck:     Musculoskeletal: Neck supple.  Cardiovascular:     Rate and Rhythm: Normal rate and regular rhythm.     Heart sounds: Normal heart sounds. No murmur. No friction rub. No gallop.   Pulmonary:     Effort: Pulmonary effort is normal. No respiratory distress.     Breath sounds: Normal breath sounds.  Abdominal:     General: There is no distension.     Palpations: Abdomen is soft.     Tenderness: There is no abdominal tenderness.  Musculoskeletal:        General: No tenderness.  Skin:    General: Skin is warm and dry.  Neurological:     Mental Status: She is alert.     Comments: Laying in bed with eyes open.  Nonverbal. Follows simple commands. Mild RUE weakness?      ED Treatments / Results  Labs (all labs  ordered are listed, but only abnormal results are displayed) Labs Reviewed  COMPREHENSIVE METABOLIC PANEL - Abnormal; Notable for the following components:      Result Value   Glucose, Bld 133 (*)    All other components within normal limits  I-STAT CHEM 8, ED - Abnormal; Notable for the following components:   Glucose, Bld 127 (*)    All other components within normal limits  PROTIME-INR  APTT  CBC  DIFFERENTIAL  I-STAT TROPONIN, ED  CBG MONITORING, ED  EKG EKG Interpretation  Date/Time:  Friday January 13 2018 18:22:58 EST Ventricular Rate:  85 PR Interval:    QRS Duration: 88 QT Interval:  395 QTC Calculation: 470 R Axis:   -16 Text Interpretation:  Sinus rhythm Probable left atrial enlargement Borderline left axis deviation Abnormal R-wave progression, early transition Confirmed by Virgel Manifold [phone]) on 01/13/2018 9:03:19 PM Also confirmed by Virgel Manifold [phone]), editor Lynder Parents [phone])  on 01/14/2018 1:52:38 PM   Radiology Ct Head Code Stroke Wo Contrast  Result Date: 01/13/2018 CLINICAL DATA:  Code stroke.  83 year old female with aphasia. EXAM: CT HEAD WITHOUT CONTRAST TECHNIQUE: Contiguous axial images were obtained from the base of the skull through the vertex without intravenous contrast. COMPARISON:  None. FINDINGS: Brain: Hyperdense somewhat flame shaped intra-axial hemorrhage in the anterior superior frontal gyrus with extension into the left subarachnoid space. Intra-axial hemorrhage component estimated at 55 x 42 x 38 millimeters (approximately 44 milliliters). Surrounding edema. Regional mass effect with 4-5 millimeters of rightward midline shift and mild mass effect on the left frontal horn. No intraventricular hemorrhage. Basilar cisterns remain normal. No other intracranial blood. Gray-white matter differentiation elsewhere appears normal. Vascular: Calcified atherosclerosis at the skull base. No suspicious intracranial vascular hyperdensity.  Skull: Osteopenia.  No acute osseous abnormality identified. Sinuses/Orbits: Visualized paranasal sinuses and mastoids are well pneumatized. Other: No acute orbit or scalp soft tissue findings. ASPECTS [facility] Stroke Program Early CT Score) Total score (0-10 with 10 being normal): Not applicable, acute hemorrhage. IMPRESSION: 1. Acute intra-axial hemorrhage in the anterior superior frontal gyrus with extension into the left subarachnoid space. Intra-axial hemorrhage volume estimated at 44 mL. 2. Surrounding edema and regional mass effect with 4-5 mm of rightward midline shift. 3. No intraventricular extension or ventriculomegaly. 4. ASPECTS is not applicable, acute hemorrhage. Dr. Cheral Marker was paged via the [facility] messaging system to discuss the above at 6:27 pmon 01/13/2018. Electronically Signed   By: Genevie Ann M.D.   On: 01/13/2018 18:27    Procedures Procedures (including critical care time)  CRITICAL CARE Performed by: Virgel Manifold Total critical care time: 35 minutes Critical care time was exclusive of separately billable procedures and treating other patients. Critical care was necessary to treat or prevent imminent or life-threatening deterioration. Critical care was time spent personally by me on the following activities: development of treatment plan with patient and/or surrogate as well as nursing, discussions with consultants, evaluation of patient's response to treatment, examination of patient, obtaining history from patient or surrogate, ordering and performing treatments and interventions, ordering and review of laboratory studies, ordering and review of radiographic studies, pulse oximetry and re-evaluation of patient's condition.   Medications Ordered in ED Medications  clevidipine (CLEVIPREX) infusion 0.5 mg/mL (1 mg/hr Intravenous New Bag/Given 01/13/18 1830)     Initial Impression / Assessment and Plan / ED Course  I have reviewed the triage vital signs and the nursing  notes.  Pertinent labs & imaging results that were available during my care of the patient were reviewed by me and considered in my medical decision making (see chart for details).     83yF with hemorrhagic CVA. Emergently evaluated by neurology. Admit.   Final Clinical Impressions(s) / ED Diagnoses   Final diagnoses:  Stroke (cerebrum) ([place])  Allergic state, sequela  Nontraumatic cortical hemorrhage of left cerebral hemisphere [facility])    ED Discharge Orders    None       Virgel Manifold, MD 01/24/18 1554

## 2018-01-14 ENCOUNTER — Other Ambulatory Visit: Payer: Self-pay

## 2018-01-14 ENCOUNTER — Inpatient Hospital Stay (HOSPITAL_COMMUNITY): Payer: Medicare Other

## 2018-01-14 ENCOUNTER — Encounter (HOSPITAL_COMMUNITY): Payer: Self-pay | Admitting: Emergency Medicine

## 2018-01-14 DIAGNOSIS — I351 Nonrheumatic aortic (valve) insufficiency: Secondary | ICD-10-CM

## 2018-01-14 DIAGNOSIS — I609 Nontraumatic subarachnoid hemorrhage, unspecified: Secondary | ICD-10-CM

## 2018-01-14 DIAGNOSIS — I680 Cerebral amyloid angiopathy: Secondary | ICD-10-CM

## 2018-01-14 DIAGNOSIS — G936 Cerebral edema: Secondary | ICD-10-CM

## 2018-01-14 LAB — LIPID PANEL
Cholesterol: 181 mg/dL (ref 0–200)
HDL: 74 mg/dL (ref >40)
LDL CALC: 94 mg/dL (ref 0–99)
TRIGLYCERIDES: 65 mg/dL (ref <150)
Total CHOL/HDL Ratio: 2.4 RATIO
VLDL: 13 mg/dL (ref 0–40)

## 2018-01-14 LAB — BASIC METABOLIC PANEL
Anion gap: 10 (ref 5–15)
BUN: 16 mg/dL (ref 8–23)
CO2: 23 mmol/L (ref 22–32)
CREATININE: 0.75 mg/dL (ref 0.44–1.00)
Calcium: 8.6 mg/dL — ABNORMAL LOW (ref 8.9–10.3)
Chloride: 107 mmol/L (ref 98–111)
GFR calc Af Amer: >60 mL/min (ref >60)
GFR calc non Af Amer: >60 mL/min (ref >60)
Glucose, Bld: 111 mg/dL — ABNORMAL HIGH (ref 70–99)
Potassium: 3.9 mmol/L (ref 3.5–5.1)
SODIUM: 140 mmol/L (ref 135–145)

## 2018-01-14 LAB — CBC
HCT: 35.7 % — ABNORMAL LOW (ref 36.0–46.0)
Hemoglobin: 10.9 g/dL — ABNORMAL LOW (ref 12.0–15.0)
MCH: 29.5 pg (ref 26.0–34.0)
MCHC: 30.5 g/dL (ref 30.0–36.0)
MCV: 96.7 fL (ref 80.0–100.0)
Platelets: 265 10*3/uL (ref 150–400)
RBC: 3.69 MIL/uL — ABNORMAL LOW (ref 3.87–5.11)
RDW: 12.9 % (ref 11.5–15.5)
WBC: 6.4 10*3/uL (ref 4.0–10.5)
nRBC: 0 % (ref 0.0–0.2)

## 2018-01-14 LAB — HEMOGLOBIN A1C
Hgb A1c MFr Bld: 5.3 % (ref 4.8–5.6)
Mean Plasma Glucose: 105.41 mg/dL

## 2018-01-14 LAB — TSH: TSH: 4.55 u[IU]/mL — ABNORMAL HIGH (ref 0.350–4.500)

## 2018-01-14 LAB — T4, FREE: Free T4: 0.75 ng/dL — ABNORMAL LOW (ref 0.82–1.77)

## 2018-01-14 LAB — ECHOCARDIOGRAM COMPLETE
Height: 69 in
Weight: 2010.6 oz

## 2018-01-14 LAB — MRSA PCR SCREENING: MRSA BY PCR: NEGATIVE

## 2018-01-14 MED ORDER — ORAL CARE MOUTH RINSE
15.0000 mL | Freq: Two times a day (BID) | OROMUCOSAL | Status: DC
  Administered 2018-01-14 – 2018-01-17 (×3): 15 mL via OROMUCOSAL

## 2018-01-14 MED ORDER — IOPAMIDOL (ISOVUE-370) INJECTION 76%
75.0000 mL | Freq: Once | INTRAVENOUS | Status: AC | PRN
  Administered 2018-01-14: 75 mL via INTRAVENOUS

## 2018-01-14 MED ORDER — CHLORHEXIDINE GLUCONATE 0.12 % MT SOLN
15.0000 mL | Freq: Two times a day (BID) | OROMUCOSAL | Status: DC
  Administered 2018-01-14 – 2018-01-17 (×8): 15 mL via OROMUCOSAL
  Filled 2018-01-14 (×6): qty 15

## 2018-01-14 MED ORDER — IOPAMIDOL (ISOVUE-370) INJECTION 76%
INTRAVENOUS | Status: AC
  Filled 2018-01-14: qty 100

## 2018-01-14 MED ORDER — LABETALOL HCL 5 MG/ML IV SOLN
10.0000 mg | INTRAVENOUS | Status: DC | PRN
  Administered 2018-01-14: 10 mg via INTRAVENOUS
  Filled 2018-01-14: qty 4

## 2018-01-14 NOTE — Progress Notes (Deleted)
Pt self catheterizes at home and requests continuing foley catheter until after discharge due to not being able to bend down.  OK per MD.   Placed order to continue foley. 

## 2018-01-14 NOTE — Plan of Care (Signed)
  Problem: Clinical Measurements: Goal: Diagnostic test results will improve Outcome: Progressing   Problem: Nutrition: Goal: Adequate nutrition will be maintained Outcome: Progressing   Problem: Coping: Goal: Level of anxiety will decrease Outcome: Progressing   Problem: Nutrition: Goal: Risk of aspiration will decrease Outcome: Progressing Goal: Dietary intake will improve Outcome: Progressing

## 2018-01-14 NOTE — Progress Notes (Signed)
Rehab Admissions Coordinator Note:  Patient was screened by Cleatrice Burke for appropriateness for an Inpatient Acute Rehab Consult per PT and SLP recommendations.  At this time, we are recommending Inpatient Rehab consult.  Cleatrice Burke 01/14/2018, 6:51 PM  I can be reached at [phone].

## 2018-01-14 NOTE — Progress Notes (Signed)
  Echocardiogram 2D Echocardiogram has been performed.  Courtney Willis F 01/14/2018, 3:39 PM

## 2018-01-14 NOTE — Progress Notes (Signed)
PT Cancellation Note  Patient Details Name: Courtney Willis MRN: [id number] DOB: 1934/03/22   Cancelled Treatment:    Reason Eval/Treat Not Completed: Active bedrest order   Duncan Dull 01/14/2018, 7:53 AM Alben Deeds, PT DPT  Board Certified Neurologic Specialist Acute Rehabilitation Services Pager [phone] Office [phone]

## 2018-01-14 NOTE — Evaluation (Signed)
Clinical/Bedside Swallow Evaluation Patient Details  Name: Courtney Willis MRN: [id number] Date of Birth: 1934-04-11  Today's Date: 01/14/2018 Time: SLP Start Time (ACUTE ONLY): 1342 SLP Stop Time (ACUTE ONLY): 1407 SLP Time Calculation (min) (ACUTE ONLY): 25 min  Past Medical History:  Past Medical History:  Diagnosis Date  . Acid reflux 02/26/2008  . Arthritis   . 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 Avitaminosis D 08/12/2014  . Bowel disease 02/16/2008  . Bunion 08/12/2014  . Cancer [facility])    Skin cancer- basal- upper arm , upper chest  . CN (constipation) 08/12/2014  . DD (diverticular disease) 02/16/2008  . Elevated liver enzymes 08/12/2014  . Fibroids, submucosal 08/12/2014   of her lower lip   . Hypercholesteremia 08/12/2014  . 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 Post menopausal syndrome 08/12/2014   Past Surgical History:  Past Surgical History:  Procedure Laterality Date  . BUNIONECTOMY     05/2004, 2007  . CATARACT EXTRACTION     04/2005, 06/2005  . COLONOSCOPY W/ POLYPECTOMY    . COLONOSCOPY WITH PROPOFOL N/A 02/15/2017   Procedure: COLONOSCOPY WITH PROPOFOL;  Surgeon: Jonathon Bellows, MD;  Location: [facility] ENDOSCOPY;  Service: Gastroenterology;  Laterality: N/A;  . HYSTEROSCOPY  2011  . ORIF WRIST FRACTURE Right 08/30/2015   Procedure: OPEN REDUCTION INTERNAL FIXATION (ORIF) RIGHT WRIST FRACTURE AND REPAIR AS NECESSARY;  Surgeon: Roseanne Kaufman, MD;  Location: [facility];  Service: Orthopedics;  Laterality: Right;  . UPPER GI ENDOSCOPY    . WRIST FRACTURE SURGERY Left    06/2006   HPI:  83 y.o. female with history of HLD presenting with aphasia. She did not receive IV t-PA due to hemorrhage. Imaging revealed large left frontal lobe intraparenchymal hemorrhage. PMH: acid reflux, hypercholesteremia,    Assessment / Plan / Recommendation Clinical Impression  Pt exhibited mild indications of pharyngeal dysphagia characterized by immediate cough with thin using straw and delayed throat clear with cup sip  thin out of multiple trials. Mastication of solid was timely. Pt sat straighter in chair, extending trunk following po's indicative of reflux (family states pt has h/o reflux). SLP will initiate Dys 3, thin however will observe closely and asked RN/family to stop po's if s/s aspiration exhibited; crush meds and no straws. Instrumental study may be warranted.    SLP Visit Diagnosis: Dysphagia, unspecified (R13.10)    Aspiration Risk  Moderate aspiration risk;Mild aspiration risk    Diet Recommendation Dysphagia 3 (Mech soft);Thin liquid   Liquid Administration via: Cup;No straw Medication Administration: Crushed with puree Supervision: Full supervision/cueing for compensatory strategies;Staff to assist with self feeding;Patient able to self feed Compensations: Slow rate;Small sips/bites;Minimize environmental distractions Postural Changes: Seated upright at 90 degrees    Other  Recommendations Oral Care Recommendations: Oral care BID   Follow up Recommendations None      Frequency and Duration min 2x/week  2 weeks       Prognosis Prognosis for Safe Diet [facility]: Good      Swallow Study   General HPI: 83 y.o. female with history of HLD presenting with aphasia. She did not receive IV t-PA due to hemorrhage. Imaging revealed large left frontal lobe intraparenchymal hemorrhage. PMH: acid reflux, hypercholesteremia,  Type of Study: Bedside Swallow Evaluation Previous Swallow Assessment: (none) Diet Prior to this Study: NPO Temperature Spikes Noted: No Respiratory Status: Room air History of Recent Intubation: No Behavior/Cognition: Alert;Cooperative;Pleasant mood;Requires cueing Oral Cavity Assessment: Within Functional Limits Oral Care Completed by SLP: Yes Oral Cavity - Dentition: Adequate natural  dentition Vision: Functional for self-feeding Self-Feeding Abilities: Needs set up;Needs assist Patient Positioning: Upright in chair Baseline Vocal Quality: Normal Volitional  Cough: Strong Volitional Swallow: Able to elicit    Oral/Motor/Sensory Function Overall Oral Motor/Sensory Function: Within functional limits   Ice Chips Ice chips: Not tested   Thin Liquid Thin Liquid: Impaired Presentation: Cup;Straw Pharyngeal  Phase Impairments: Cough - Delayed;Throat Clearing - Immediate    Nectar Thick Nectar Thick Liquid: Not tested   Honey Thick Honey Thick Liquid: Not tested   Puree Puree: Within functional limits   Solid     Solid: Within functional limits      Courtney Willis 01/14/2018,3:17 PM  Courtney Willis.Ed Risk analyst [phone] Office [phone]

## 2018-01-14 NOTE — Evaluation (Signed)
Speech Language Pathology Evaluation Patient Details Name: Courtney Willis MRN: [id number] DOB: [birth year] Today's Date: 01/14/2018 Time: 8676-7209 SLP Time Calculation (min) (ACUTE ONLY): 25 min  Problem List:  Patient Active Problem List   Diagnosis Date Noted  . ICH (intracerebral hemorrhage) ([place]) 01/13/2018  . Subclinical hypothyroidism 07/20/2017  . Bunion 08/12/2014  . Cataract 08/12/2014  . CN (constipation) 08/12/2014  . Edema of foot 08/12/2014  . Fibroids, submucosal 08/12/2014  . Glaucoma 08/12/2014  . Hypercholesteremia 08/12/2014  . Coitalgia 08/12/2014  . 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 Avitaminosis D 08/12/2014  . Post menopausal syndrome 08/12/2014  . Alcohol use 08/06/2014  . Acid reflux 02/26/2008  . DD (diverticular disease) 02/16/2008  . Bowel disease 02/16/2008   Past Medical History:  Past Medical History:  Diagnosis Date  . Acid reflux 02/26/2008  . Arthritis   . 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 Avitaminosis D 08/12/2014  . Bowel disease 02/16/2008  . Bunion 08/12/2014  . Cancer [facility])    Skin cancer- basal- upper arm , upper chest  . CN (constipation) 08/12/2014  . DD (diverticular disease) 02/16/2008  . Elevated liver enzymes 08/12/2014  . Fibroids, submucosal 08/12/2014   of her lower lip   . Hypercholesteremia 08/12/2014  . 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 Post menopausal syndrome 08/12/2014   Past Surgical History:  Past Surgical History:  Procedure Laterality Date  . BUNIONECTOMY     05/2004, 2007  . CATARACT EXTRACTION     04/2005, 06/2005  . COLONOSCOPY W/ POLYPECTOMY    . COLONOSCOPY WITH PROPOFOL N/A 02/15/2017   Procedure: COLONOSCOPY WITH PROPOFOL;  Surgeon: Jonathon Bellows, MD;  Location[facility] ENDOSCOPY;  Service: Gastroenterology;  Laterality: N/A;  . HYSTEROSCOPY  2011  . ORIF WRIST FRACTURE Right 08/30/2015   Procedure: OPEN REDUCTION INTERNAL FIXATION (ORIF) RIGHT WRIST FRACTURE AND REPAIR AS NECESSARY;  Surgeon:  Roseanne Kaufman, MD;  Location: [facility];  Service: Orthopedics;  Laterality: Right;  . UPPER GI ENDOSCOPY    . WRIST FRACTURE SURGERY Left    06/2006   HPI:  83 y.o. female with history of HLD presenting with aphasia. She did not receive IV t-PA due to hemorrhage. Imaging revealed large left frontal lobe intraparenchymal hemorrhage. PMH: acid reflux, hypercholesteremia,    Assessment / Plan / Recommendation Clinical Impression  Pt is pleasant and cooperative pt with large left hemorrhagic stroke with supportive family members at bedside. Aphasia appears more global with spontaneous language limited to greetings/fillers "ok", "yeah". Receptive understanding of basic biological and environmental information is fair-poor for yes/no questions and simple command following; not aware of majority of errors. Writen information did not assist in command following. Repetition of single words and longer phrases is excellent. Spontaneous naming of common objects was difficult facilitated by phrase completion or phonemic cue. Cognitively pt's sustained attention was fair and needed assist in the areas of functional problem solving during feeding. Pt woould benefit from intense ST on inpatient rehab setting.         SLP Assessment  SLP Recommendation/Assessment: Patient needs continued Speech Lanaguage Pathology Services SLP Visit Diagnosis: Cognitive communication deficit (R41.841)    Follow Up Recommendations  Inpatient Rehab    Frequency and Duration min 2x/week  2 weeks      SLP Evaluation Cognition  Overall Cognitive Status: Impaired/Different from baseline Arousal/Alertness: Awake/alert Orientation Level: (oriented person only via y/n questions) Attention: Sustained Sustained Attention: Impaired Sustained Attention Impairment: Verbal basic;Functional basic Memory: (TBA) Awareness: Impaired  Awareness Impairment: Emergent impairment Problem Solving: Impaired Problem Solving Impairment:  Functional basic Safety/Judgment: Impaired       Comprehension  Auditory Comprehension Overall Auditory Comprehension: Impaired Yes/No Questions: Impaired Basic Immediate Environment Questions: 50-74% accurate(70) Commands: Impaired One Step Basic Commands: 0-24% accurate Interfering Components: Attention;Processing speed EffectiveTechniques: Extra processing time Visual Recognition/Discrimination Discrimination: Not tested Reading Comprehension Reading Status: Impaired Word level: Impaired Sentence Level: Impaired    Expression Expression Primary Mode of Expression: Other (comment)(combination responses y/n, gestures) Verbal Expression Overall Verbal Expression: Impaired Initiation: Impaired Automatic Speech: (95% accurated given cue to initiate) Level of Generative/Spontaneous Verbalization: Phrase Repetition: No impairment(repeated 9 word sentence without difficulty) Naming: Impairment Confrontation: Impaired(0% independent, stimulable with verbal cues) Verbal Errors: Perseveration Pragmatics: No impairment Written Expression Dominant Hand: Right Written Expression: Exceptions to [facility] Dictation Ability: Word   Oral / Motor  Oral Motor/Sensory Function Overall Oral Motor/Sensory Function: Within functional limits Motor Speech Overall Motor Speech: Appears within functional limits for tasks assessed Respiration: Within functional limits Phonation: Normal Resonance: Within functional limits Articulation: Within functional limitis Intelligibility: Intelligible Motor Planning: Witnin functional limits   GO                    [name] 01/14/2018, 3:42 PM   Orbie Pyo Demetries Coia M.Ed Risk analyst [phone] Office [phone]

## 2018-01-14 NOTE — Evaluation (Signed)
Physical Therapy Evaluation Patient Details Name: Courtney Willis MRN: [id number] DOB: 20-Jan-1935 Today's Date: 01/14/2018   History of Present Illness  83 y.o. female with history of HLD presenting with aphasia. She did not receive IV t-PA due to hemorrhage. Imaging revealed large left frontal lobe intraparenchymal hemorrhage  Clinical Impression  Orders received for PT evaluation. Patient demonstrates deficits in functional mobility as indicated below. Will benefit from continued skilled PT to address deficits and maximize function. Will see as indicated and progress as tolerated.  Prior to admission patient was fiercely independent, and living at [facility] living community with no assist required. Patient had recently returned from trip to [place] and has plans set to travel to [place] in the near future.  At this time, patient presenting with significant cognitive and processing deficits impeding ability to function safely and independently. Patient mobilizing straight path hallway ambulation with some physical assist for environmental awareness, balance and safety. Despite being able to manage straight path navigation, patient requires increased assist for variable common mobility such as ambulation with head turns, direction changes, or conversation/cognitive inquiry. Given patient's highly independent prior level of function, her family support, current functional deficits; feel patient would benefit tremendously from comprehensive inpatient therapies to maximize recovery of function and independence.   Educated family at bedside regarding activities for attention and positioning to the right of patient. Family very receptive.     Follow Up Recommendations CIR    Equipment Recommendations  None recommended by PT    Recommendations for Other Services Rehab consult     Precautions / Restrictions Precautions Precautions: Fall      Mobility  Bed Mobility Overal bed mobility:  Needs Assistance Bed Mobility: Rolling;Supine to Sit Rolling: Min assist   Supine to sit: Min assist     General bed mobility comments: Physical assist for initiation of movement, increased time and effort to perform. Max multi modal cues.   Transfers Overall transfer level: Needs assistance Equipment used: 1 person hand held assist Transfers: Sit to/from Stand Sit to Stand: Min assist         General transfer comment: min assist to power up from bed and from toilet with grab bar. Increased time and effort to initiate power up. Min assist to maintain stability in standing  Ambulation/Gait Ambulation/Gait assistance: Min assist Gait Distance (Feet): 60 Feet(x2) Assistive device: 1 person hand held assist Gait Pattern/deviations: Step-through pattern;Decreased stride length;Drifts right/left Gait velocity: decreased Gait velocity interpretation: <1.8 ft/sec, indicate of risk for recurrent falls General Gait Details: patient with poor environmental awareness specific to the right side. Increased assist for stability.   Stairs            Wheelchair Mobility    Modified Rankin (Stroke Patients Only) Modified Rankin (Stroke Patients Only) Pre-Morbid Rankin Score: No symptoms Modified Rankin: Moderately severe disability     Balance Overall balance assessment: Needs assistance   Sitting balance-Leahy Scale: Fair     Standing balance support: During functional activity Standing balance-Leahy Scale: Poor(to fair) Standing balance comment: hands on assist for stability             High level balance activites: Backward walking;Direction changes;Turns;Sudden stops;Head turns High Level Balance Comments: min to moderate assist for LOB x2 during higher level task performance             Pertinent Vitals/Pain Pain Assessment: Faces Faces Pain Scale: Hurts little more Pain Location: presumed headache Pain Descriptors / Indicators: Headache Pain  Intervention(s):  Monitored during session    Home Living Family/patient expects to be discharged to:: Private residence Living Arrangements: Alone Available Help at Discharge: Family;Friend(s) Type of Home: Independent living facility([facility]) Home Access: Level entry     [place]: [place]: Environmental consultant - 2 wheels;Cane - single point;Bedside commode;Shower seat;Grab bars - tub/shower      Prior Function Level of Independence: Independent         Comments: patient just returned from trip to [place]: Right    Extremity/Trunk Assessment   Upper Extremity Assessment Upper Extremity Assessment: Defer to OT evaluation    Lower Extremity Assessment Lower Extremity Assessment: RLE deficits/detail RLE Deficits / Details: modest assymetry in RLE strength upon testing but overall functional bilaterally RLE Coordination: decreased gross motor(due to delayed initiation )       Communication   Communication: Expressive difficulties  Cognition Arousal/Alertness: Awake/alert Behavior During Therapy: Flat affect Overall Cognitive Status: Impaired/Different from baseline Area of Impairment: Orientation;Memory;Attention;Following commands;Safety/judgement;Awareness;Problem solving                 Orientation Level: (unable to assess) Current Attention Level: Focused;Sustained   Following Commands: Follows one step commands with increased time;Follows multi-step commands inconsistently(5-7 second delayed response)   Awareness: Intellectual(difficult to assess) Problem Solving: Slow processing;Decreased initiation;Difficulty sequencing;Requires verbal cues;Requires tactile cues General Comments: patient with ability to follow basic commands but requires processing delay of 5-7 seconds. Additionaly, able to initiate automatic tasks with increased time and effort approx 50% of the time. Had moments of frustration  with tearfulness when patient was attempting to was hands and could not sequence or perform task      General Comments      Exercises     Assessment/Plan    PT Assessment Patient needs continued PT services  PT Problem List Decreased strength;Decreased activity tolerance;Decreased balance;Decreased mobility;Decreased coordination;Decreased cognition;Decreased safety awareness;Pain       PT Treatment Interventions DME instruction;Gait training;Functional mobility training;Therapeutic activities;Therapeutic exercise;Balance training;Neuromuscular re-education;Cognitive remediation;Patient/family education    PT Goals (Current goals can be found in the Care Plan section)  Acute Rehab PT Goals Patient Stated Goal: per family, patient with trip to [place] planned PT Goal Formulation: With family Time For Goal Achievement: 01/28/18 Potential to Achieve Goals: Good    Frequency Min 3X/week   Barriers to discharge        Co-evaluation               AM-PAC PT "6 Clicks" Mobility  Outcome Measure Help needed turning from your back to your side while in a flat bed without using bedrails?: A Lot Help needed moving from lying on your back to sitting on the side of a flat bed without using bedrails?: A Lot Help needed moving to and from a bed to a chair (including a wheelchair)?: A Little Help needed standing up from a chair using your arms (e.g., wheelchair or bedside chair)?: A Little Help needed to walk in hospital room?: A Little Help needed climbing 3-5 steps with a railing? : A Lot 6 Click Score: 15    End of Session Equipment Utilized During Treatment: Gait belt Activity Tolerance: Patient tolerated treatment well Patient left: in chair;with call bell/phone within reach;with chair alarm set;with family/visitor present Nurse Communication: Mobility status PT Visit Diagnosis: Difficulty in walking, not elsewhere classified (R26.2);Other symptoms and signs involving the  nervous system (K93.818)    Time: 2993-7169 PT Time  Calculation (min) (ACUTE ONLY): 25 min   Charges:   PT Evaluation $PT Eval Moderate Complexity: 1 Mod PT Treatments $Self Care/Home Management: 8-22        Alben Deeds, PT DPT  Board Certified Neurologic Specialist Acute Rehabilitation Services Pager [phone] Office [phone]   Duncan Dull 01/14/2018, 12:01 PM

## 2018-01-14 NOTE — Progress Notes (Signed)
STROKE TEAM PROGRESS NOTE   SUBJECTIVE (INTERVAL HISTORY) Her granddaughter and boyfriend are at the bedside.  Patient eyes open, able to follow most the simple commands, however still has expressive aphasia.  Mild weakness right facial and right upper extremity.  BP stable, no BP meds needed at this time.  CT repeat this morning stable hematoma.   OBJECTIVE Vitals:   01/14/18 0500 01/14/18 0600 01/14/18 0700 01/14/18 0800  BP: 106/65 107/65 103/63 (!) 97/55  Pulse: 67 65 67 63  Resp: 15 16 15  (!) 27  Temp:      TempSrc:      SpO2: 97% 97% 97% 97%  Weight: 57 kg     Height: 5\' 9"  (1.753 m)       CBC:  Recent Labs  Lab 01/13/18 1810 01/13/18 1823  WBC 8.6  --   NEUTROABS 6.9  --   HGB 12.5 13.6  HCT 40.1 40.0  MCV 96.4  --   PLT 278  --     Basic Metabolic Panel:  Recent Labs  Lab 01/13/18 1810 01/13/18 1823  NA 138 137  K 4.1 4.1  CL 102 103  CO2 23  --   GLUCOSE 133* 127*  BUN 19 20  CREATININE 0.82 0.60  CALCIUM 9.2  --     Lipid Panel:     Component Value Date/Time   CHOL 181 01/14/2018 0605   CHOL 206 (H) 07/20/2017 1059   TRIG 65 01/14/2018 0605   HDL 74 01/14/2018 0605   HDL 83 07/20/2017 1059   CHOLHDL 2.4 01/14/2018 0605   VLDL 13 01/14/2018 0605   LDLCALC 94 01/14/2018 0605   LDLCALC 106 (H) 07/20/2017 1059   HgbA1c:  Lab Results  Component Value Date   HGBA1C 5.3 01/14/2018   Urine Drug Screen: No results found for: LABOPIA, COCAINSCRNUR, LABBENZ, AMPHETMU, THCU, LABBARB  Alcohol Level No results found for: ETH  IMAGING   Ct Angio Head W Or Wo Contrast Ct Angio Neck W Or Wo Contrast 01/14/2018 IMPRESSION:   CTA NECK:  1. No hemodynamically significant stenosis ICA's.  2. Patent vertebral arteries.   CTA HEAD:  1. No emergent large vessel occlusion, flow-limiting stenosis or vascular malformation. Complete circle-of-Willis.    Mr Brain Wo Contrast 01/13/2018 IMPRESSION:  1. Large left frontal lobe intraparenchymal  hemorrhage trauma unchanged in size from earlier CT, allowing for differences in modality.  2. Unchanged 4 mm rightward midline shift.  3. Susceptibility effects from blood obscure diffusion-weighted imaging at the infarct site. No evidence of amyloid angiopathy.    Dg Chest Port 1 View 01/13/2018 IMPRESSION:  Pulmonary hyperinflation suspected. No acute cardiopulmonary abnormality.    Ct Head Code Stroke Wo Contrast 01/13/2018   IMPRESSION:  1. Acute intra-axial hemorrhage in the anterior superior frontal gyrus with extension into the left subarachnoid space. Intra-axial hemorrhage volume estimated at 44 mL.  2. Surrounding edema and regional mass effect with 4-5 mm of rightward midline shift.  3. No intraventricular extension or ventriculomegaly.  4. ASPECTS is not applicable, acute hemorrhage.     Transthoracic Echocardiogram  00/00/00 Pending     PHYSICAL EXAM  Temp:  [98.2 F (36.8 C)-99 F (37.2 C)] 98.2 F (36.8 C) (12/14 0800) Pulse Rate:  [63-103] 75 (12/14 1100) Resp:  [12-27] 18 (12/14 1100) BP: (97-155)/(47-89) 133/75 (12/14 1100) SpO2:  [90 %-100 %] 98 % (12/14 1100) Weight:  [57 kg] 57 kg (12/14 0500)  General - Well nourished, well developed, in no  apparent distress.  Ophthalmologic - fundi not visualized due to noncooperation.  Cardiovascular - Regular rate and rhythm.  Neuro -awake, alert, eyes open, left gaze preference, barely cross midline.  Blinking to visual threat on left but not to the right.  Able to track objects on the left visual field.  Expressive aphasia, no speech output.  Able to follow most central and peripheral commands, however not all of them.  Right nasolabial fold flattening, tongue midline.  Left upper and lower extremity 5/5, right upper extremity 4/5, right lower extremity 4+/5.  DTR 1+, no Babinski.  Sensation in the coordination not corporative.  Gait not tested.   ASSESSMENT/PLAN Courtney Willis is a 83 y.o. female with  history of HLD presenting with aphasia. She did not receive IV t-PA due to hemorrhage.  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   MRI head - Large left frontal lobe intraparenchymal hemorrhage. Unchanged 4 mm rightward midline shift.   CTA H&N - unremarkable, stable hematoma and midline shift  Repeat CT in am  2D Echo - pending  LDL - 94  HgbA1c - 5.3  VTE prophylaxis - SCDs  Diet - NPO  No antithrombotic prior to admission, now on No antithrombotic.  Given concern of CAA, recommend to avoid antithrombotics.  Ongoing aggressive stroke risk factor management  Therapy recommendations:  pending  Disposition:  Pending  Cerebral edema  CT and MRI showed midline shift 4 mm but stable  CT repeat in a.m.  Clinically stable, no need for 3% at this time  Close monitoring  Possible CAA  No history of hypertension  BP stable on admission and during hospitalization  With lobar hemorrhage, CAA most likely although lack of diffuse CMBs.  BP measurement  Avoid antithrombotics  BP measurement  Stable  No BP meds needed at this time . SBP goal < 140 mmHg . Long-term BP goal normotensive  Hyperlipidemia  Lipid lowering medication PTA:  none  LDL 94, goal < 70  Consider low-dose statin at discharge  Other Stroke Risk Factors  Advanced age  Former cigarette smoker - quit  Other Active Problems  Dysphagia pending speech evaluation   Hospital day # 1  This patient is critically ill due to large left frontal hemorrhage, SAH, cerebral edema and at significant risk of neurological worsening, death form brain herniation, seizure, hematoma expansion. This patient's care requires constant monitoring of vital signs, hemodynamics, respiratory and cardiac monitoring, review of multiple databases,  neurological assessment, discussion with family, other specialists and medical decision making of high complexity. I spent 40 minutes of neurocritical care time in the care of this patient. I had long discussion with granddaughter and boyfriend at bedside, updated pt current condition, treatment plan and potential prognosis. They expressed understanding and appreciation.    Rosalin Hawking, MD PhD Stroke Neurology 01/14/2018 11:43 AM    To contact Stroke Continuity provider, please refer to [email or web address] After hours, contact General Neurology

## 2018-01-15 ENCOUNTER — Inpatient Hospital Stay (HOSPITAL_COMMUNITY): Payer: Medicare Other

## 2018-01-15 LAB — CBC
HCT: 33.6 % — ABNORMAL LOW (ref 36.0–46.0)
Hemoglobin: 10.2 g/dL — ABNORMAL LOW (ref 12.0–15.0)
MCH: 29.3 pg (ref 26.0–34.0)
MCHC: 30.4 g/dL (ref 30.0–36.0)
MCV: 96.6 fL (ref 80.0–100.0)
Platelets: 231 10*3/uL (ref 150–400)
RBC: 3.48 MIL/uL — ABNORMAL LOW (ref 3.87–5.11)
RDW: 12.8 % (ref 11.5–15.5)
WBC: 6.2 10*3/uL (ref 4.0–10.5)
nRBC: 0 % (ref 0.0–0.2)

## 2018-01-15 LAB — BASIC METABOLIC PANEL
Anion gap: 10 (ref 5–15)
BUN: 12 mg/dL (ref 8–23)
CO2: 22 mmol/L (ref 22–32)
CREATININE: 0.7 mg/dL (ref 0.44–1.00)
Calcium: 8.4 mg/dL — ABNORMAL LOW (ref 8.9–10.3)
Chloride: 102 mmol/L (ref 98–111)
GFR calc Af Amer: >60 mL/min (ref >60)
GFR calc non Af Amer: >60 mL/min (ref >60)
Glucose, Bld: 104 mg/dL — ABNORMAL HIGH (ref 70–99)
Potassium: 3.6 mmol/L (ref 3.5–5.1)
Sodium: 134 mmol/L — ABNORMAL LOW (ref 135–145)

## 2018-01-15 MED ORDER — LABETALOL HCL 5 MG/ML IV SOLN: 10.0000 mg | INTRAVENOUS | Status: DC | PRN

## 2018-01-15 MED ORDER — LABETALOL HCL 5 MG/ML IV SOLN
10.0000 mg | INTRAVENOUS | Status: DC | PRN
  Filled 2018-01-15: qty 4

## 2018-01-15 MED ORDER — PANTOPRAZOLE SODIUM 40 MG PO TBEC
40.0000 mg | DELAYED_RELEASE_TABLET | Freq: Every day | ORAL | Status: DC
  Administered 2018-01-15 – 2018-01-17 (×3): 40 mg via ORAL
  Filled 2018-01-15 (×3): qty 1

## 2018-01-15 NOTE — Progress Notes (Signed)
  Speech Language Pathology Treatment: Dysphagia;Cognitive-Linquistic  Patient Details Name: Courtney Willis MRN: [id number] DOB: 19-Jan-1935 Today's Date: 01/15/2018 Time: 4599-7741 SLP Time Calculation (min) (ACUTE ONLY): 21 min  Assessment / Plan / Recommendation Clinical Impression  Pt consuming lunch with son at bedside when SLP arrived. Masticated peaches and bread with timeliness, no residue and no pharyngeal s/s aspiration during observation, during phonation or trials with straw use. Upgraded to regular texture, continue thin- may use straws.   Verbal output approximately similar to yesterday. Displayed difficulty comprehending questions re: biographical information- stared at therapist and echolalic although laughed appropriately during conversation. Improvements in confrontational naming of common objects today (4/4 independently versus 0/6 independently yesterday). Recommend intense ST on CIR if able or home health.   HPI HPI: Courtney Willis with history of HLD presenting with aphasia. She did not receive IV t-PA due to hemorrhage. Imaging revealed large left frontal lobe intraparenchymal hemorrhage. PMH: acid reflux, hypercholesteremia,       SLP Plan  Continue with current plan of care       Recommendations  Diet recommendations: Regular;Thin liquid Liquids provided via: Straw;Cup Medication Administration: Whole meds with puree Supervision: Patient able to self feed Compensations: Small sips/bites;Slow rate Postural Changes and/or Swallow Maneuvers: Seated upright 90 degrees                Oral Care Recommendations: Oral care BID Follow up Recommendations: Inpatient Rehab SLP Visit Diagnosis: Cognitive communication deficit (R41.841);Dysphagia, unspecified (R13.10) Plan: Continue with current plan of care       GO                [name] 01/15/2018, 3:26 PM [name].Ed Risk analyst [phone] Office  [phone]

## 2018-01-15 NOTE — Progress Notes (Addendum)
STROKE TEAM PROGRESS NOTE   SUBJECTIVE (INTERVAL HISTORY) Her son and boyfriend are at the bedside.  Patient sitting in chair for breakfast. BP stable. CT showed contracting of hematoma and no significant MLS. PT/OT recommend CIR.    OBJECTIVE Vitals:   01/15/18 0800 01/15/18 0900 01/15/18 0915 01/15/18 1000  BP: 128/73 (!) 151/100 138/76 123/65  Pulse: 77 72 74 65  Resp: 16 17 17 14   Temp: 98.3 F (36.8 C)     TempSrc: Oral     SpO2: 98% 100% 100% 99%  Weight:      Height:        CBC:  Recent Labs  Lab 01/13/18 1810  01/14/18 1003 01/15/18 0152  WBC 8.6  --  6.4 6.2  NEUTROABS 6.9  --   --   --   HGB 12.5   < > 10.9* 10.2*  HCT 40.1   < > 35.7* 33.6*  MCV 96.4  --  96.7 96.6  PLT 278  --  265 231   < > = values in this interval not displayed.    Basic Metabolic Panel:  Recent Labs  Lab 01/14/18 1003 01/15/18 0152  NA 140 134*  K 3.9 3.6  CL 107 102  CO2 23 22  GLUCOSE 111* 104*  BUN 16 12  CREATININE 0.75 0.70  CALCIUM 8.6* 8.4*    Lipid Panel:     Component Value Date/Time   CHOL 181 01/14/2018 0605   CHOL 206 (H) 07/20/2017 1059   TRIG 65 01/14/2018 0605   HDL 74 01/14/2018 0605   HDL 83 07/20/2017 1059   CHOLHDL 2.4 01/14/2018 0605   VLDL 13 01/14/2018 0605   LDLCALC 94 01/14/2018 0605   LDLCALC 106 (H) 07/20/2017 1059   HgbA1c:  Lab Results  Component Value Date   HGBA1C 5.3 01/14/2018   Urine Drug Screen: No results found for: LABOPIA, COCAINSCRNUR, LABBENZ, AMPHETMU, THCU, LABBARB  Alcohol Level No results found for: ETH  IMAGING   Ct Angio Head W Or Wo Contrast Ct Angio Neck W Or Wo Contrast 01/14/2018 IMPRESSION:   CTA NECK:  1. No hemodynamically significant stenosis ICA's.  2. Patent vertebral arteries.   CTA HEAD:  1. No emergent large vessel occlusion, flow-limiting stenosis or vascular malformation. Complete circle-of-Willis.    Mr Brain Wo Contrast 01/13/2018 IMPRESSION:  1. Large left frontal lobe  intraparenchymal hemorrhage trauma unchanged in size from earlier CT, allowing for differences in modality.  2. Unchanged 4 mm rightward midline shift.  3. Susceptibility effects from blood obscure diffusion-weighted imaging at the infarct site. No evidence of amyloid angiopathy.    Dg Chest Port 1 View 01/13/2018 IMPRESSION:  Pulmonary hyperinflation suspected. No acute cardiopulmonary abnormality.    Ct Head Code Stroke Wo Contrast 01/13/2018   IMPRESSION:  1. Acute intra-axial hemorrhage in the anterior superior frontal gyrus with extension into the left subarachnoid space. Intra-axial hemorrhage volume estimated at 44 mL.  2. Surrounding edema and regional mass effect with 4-5 mm of rightward midline shift.  3. No intraventricular extension or ventriculomegaly.  4. ASPECTS is not applicable, acute hemorrhage.   Ct Head Wo Contrast  Result Date: 01/15/2018 CLINICAL DATA:  Follow up intracranial hemorrhage. EXAM: CT HEAD WITHOUT CONTRAST TECHNIQUE: Contiguous axial images were obtained from the base of the skull through the vertex without intravenous contrast. COMPARISON:  CT and MRI head January 13, 2018. FINDINGS: BRAIN: Evolving 3.7 x 3.8 x 4.5 cm (volume = 33 cc) LEFT frontal intraparenchymal  hematoma was 44 cc. Surrounding low-density vasogenic edema. Regional mass effect deforming the LEFT frontal horn lateral ventricle without midline shift. Small volume LEFT frontal subarachnoid hemorrhage. Stable dense LEFT parieto-occipital subdural hematoma measuring to 4 mm. No acute large vascular territory infarct. No parenchymal brain volume loss for age. Basal cisterns are patent. VASCULAR: Minimal calcific atherosclerosis of the carotid siphons. SKULL: No skull fracture. No significant scalp soft tissue swelling. SINUSES/ORBITS: Trace paranasal sinus mucosal thickening. Mastoid air cells are well aerated.The included ocular globes and orbital contents are non-suspicious. OTHER: None.  IMPRESSION: 1. Involving acute 33 cc LEFT frontal hematoma was 44 cc. Regional mass effect without midline shift. 2. Stable small volume LEFT frontal subarachnoid hemorrhage. Stable LEFT parietooccipital 4 mm subdural hematoma. Electronically Signed   By: Elon Alas M.D.   On: 01/15/2018 05:28     PHYSICAL EXAM  Temp:  [97.4 F (36.3 C)-98.6 F (37 C)] 98.3 F (36.8 C) (12/15 0800) Pulse Rate:  [57-80] 65 (12/15 1000) Resp:  [14-20] 14 (12/15 1000) BP: (113-151)/(61-106) 123/65 (12/15 1000) SpO2:  [96 %-100 %] 99 % (12/15 1000)  General - Well nourished, well developed, in no apparent distress.  Ophthalmologic - fundi not visualized due to noncooperation.  Cardiovascular - Regular rate and rhythm.  Neuro - awake, alert, eyes open, left gaze preference, but able to cross midline.  Blinking to visual threat more on the left than right.  Able to track objects on the left visual field.  Expressive aphasia, improved, able to repeat simple sentences, not able to name, no spontaneous speech output, follows limited simple commands.  Right nasolabial fold flattening, tongue midline.  Left upper and lower extremity 5/5, right upper extremity 4/5, right lower extremity 4+/5.  DTR 1+, no Babinski.  Sensation in the coordination not corporative.  Gait not tested.   ASSESSMENT/PLAN [name] is a 83 y.o. female with history of HLD presenting with aphasia. She did not receive IV t-PA due to hemorrhage.  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   MRI head - Large left frontal lobe intraparenchymal hemorrhage. Unchanged 4 mm rightward midline shift.   CTA H&N - unremarkable, stable hematoma and midline shift  Repeat CT showed contracting of hematoma and no significant MLS  2D Echo - EF 65-70%  LDL - 94  HgbA1c -  5.3  VTE prophylaxis - SCDs  Diet - NPO  No antithrombotic prior to admission, now on No antithrombotic.  Given concern of CAA, recommend to avoid antithrombotics.  Ongoing aggressive stroke risk factor management  Therapy recommendations:  CIR  Disposition:  Pending  Cerebral edema  CT and MRI showed midline shift 4 mm but stable  CT repeat showed contracting of hematoma and no significant MLS  Clinically stable, no need for 3% at this time  Continue monitoring  Possible CAA  No history of hypertension  BP stable on admission and during hospitalization  With lobar hemorrhage, CAA most likely although lack of diffuse CMBs.  BP measurement  Avoid antithrombotics  BP measurement  Stable  No BP meds needed at this time . SBP goal < 160 mmHg . Long-term BP goal normotensive  Hyperlipidemia  Lipid lowering medication PTA:  none  LDL 94, goal < 70  Consider low-dose statin at discharge  Other Stroke Risk Factors  Advanced age  Former cigarette smoker - quit  Other Active Problems  Hyponatremia Na 140->134 - encourage po intake and check BMP in am   Hospital day # 2  This patient is critically ill due to large left frontal hemorrhage, SAH, cerebral edema and at significant risk of neurological worsening, death form brain herniation, seizure, hematoma expansion. This patient's care requires constant monitoring of vital signs, hemodynamics, respiratory and cardiac monitoring, review of multiple databases, neurological assessment, discussion with family, other specialists and medical decision making of high complexity. I spent 30 minutes of neurocritical care time in the care of this patient. I had long discussion with son and boyfriend at bedside, updated pt current condition, treatment plan and potential prognosis. They expressed understanding and appreciation.   Rosalin Hawking, MD PhD Stroke Neurology 01/15/2018 11:14 AM   To contact Stroke Continuity  provider, please refer to [email or web address] After hours, contact General Neurology

## 2018-01-15 NOTE — Plan of Care (Signed)
  Problem: Self-Care: Goal: Ability to communicate needs accurately will improve Outcome: Progressing   Problem: Nutrition: Goal: Adequate nutrition will be maintained Outcome: Progressing

## 2018-01-15 NOTE — Evaluation (Signed)
Occupational Therapy Evaluation Patient Details Name: Courtney Willis MRN: [id number] DOB: 26-Apr-1934 Today's Date: 01/15/2018    History of Present Illness 83 y.o. female with history of HLD presenting with aphasia. She did not receive IV t-PA due to hemorrhage. Imaging revealed large left frontal lobe intraparenchymal hemorrhage   Clinical Impression   PTA patient independent.  Currently admitted for above and limited by problem list below, including aphasia, R UE hemiparesis with decreased functional use of dominant UE, R inattention, decreased activity tolerance, impaired balance, and impaired cognition (problem solving, sequencing, initation).   Patient currently requires mod assist for grooming, mod assist for UB ADLs, max assist for LB ADLs and min assist for transfers.  Patient requires increased time for processing and sequencing, presents with L gaze preference, and able to follow simple 1 step commands given increased time.  Patient will benefit from continued OT services while admitted and after dc at CIR level in order to optimize return to independence with ADLs and mobility.  Will continue to follow.   I have discussed the patient's current level of function related to ADLs with the patient and family.  They acknowledge understanding of this and do not feel the patient would be able to have their care needs met at home.  They are interested in post-acute rehab in an inpatient setting.     Follow Up Recommendations  CIR    Equipment Recommendations  Other (comment)(TBD at next venue of care)    Recommendations for Other Services Rehab consult     Precautions / Restrictions Precautions Precautions: Fall Restrictions Weight Bearing Restrictions: No      Mobility Bed Mobility Overal bed mobility: Needs Assistance Bed Mobility: Supine to Sit     Supine to sit: Min guard     General bed mobility comments: min guard for safety and initation of mobility, multimodal cueing    Transfers Overall transfer level: Needs assistance Equipment used: 1 person hand held assist Transfers: Sit to/from Stand Sit to Stand: Min assist         General transfer comment: min assist for safety and balance     Balance Overall balance assessment: Needs assistance Sitting-balance support: No upper extremity supported;Feet supported Sitting balance-Leahy Scale: Fair     Standing balance support: During functional activity;No upper extremity supported Standing balance-Leahy Scale: Fair Standing balance comment: hands on assist for stability                           ADL either performed or assessed with clinical judgement   ADL Overall ADL's : Needs assistance/impaired Eating/Feeding: Minimal assistance Eating/Feeding Details (indicate cue type and reason): taking a sip of water  Grooming: Oral care;Moderate assistance;Brushing hair;Standing Grooming Details (indicate cue type and reason): maximal multimodal cueing to sequence and problem solve, min assist in standing  Upper Body Bathing: Moderate assistance;Sitting   Lower Body Bathing: Maximal assistance;Sit to/from stand   Upper Body Dressing : Moderate assistance;Sitting   Lower Body Dressing: Maximal assistance;Sit to/from stand   Toilet Transfer: Minimal assistance;Ambulation Toilet Transfer Details (indicate cue type and reason): simulated in room         Functional mobility during ADLs: Minimal assistance;Cueing for sequencing;Cueing for safety General ADL Comments: patient requires increased assist for self care tasks due to decreased sequencing, problem sovling, intation, and decreased functional use of R UE      Vision Baseline Vision/History: Wears glasses Wears Glasses: At all times Patient Visual  Report: Other (comment)(unable to assess ') Additional Comments: difficult to assess but noted L gaze preference and turns head towards R without isolation of eyes      Perception  Perception Perception Tested?: Yes Perception Deficits: Inattention/neglect Inattention/Neglect: Does not attend to right visual field;Does not attend to right side of body   Praxis      Pertinent Vitals/Pain Pain Assessment: Faces Faces Pain Scale: No hurt Pain Intervention(s): Monitored during session     Hand Dominance Right   Extremity/Trunk Assessment Upper Extremity Assessment Upper Extremity Assessment: RUE deficits/detail RUE Deficits / Details: strength 3+/5, bradykiensic movements, imapired functional coordination and decreased attention to UE  RUE Sensation: decreased light touch;decreased proprioception(difficult to assess) RUE Coordination: decreased fine motor;decreased gross motor   Lower Extremity Assessment Lower Extremity Assessment: Defer to PT evaluation   Cervical / Trunk Assessment Cervical / Trunk Assessment: Normal   Communication Communication Communication: Expressive difficulties   Cognition Arousal/Alertness: Awake/alert Behavior During Therapy: Flat affect Overall Cognitive Status: Difficult to assess                   Orientation Level: (unable to assess) Current Attention Level: Sustained   Following Commands: Follows one step commands with increased time;Follows multi-step commands inconsistently   Awareness: Intellectual(difficult to assess) Problem Solving: Slow processing;Decreased initiation;Difficulty sequencing;Requires verbal cues;Requires tactile cues General Comments: pt able to follow simple 1 step commands with increased time, but demosntrates difficulty sequencing and problem sovling through automatic tasks with maximal multimodal cueing   General Comments  family present and supportive, VSS    Exercises     Shoulder Instructions      Home Living Family/patient expects to be discharged to:: Private residence Living Arrangements: Alone Available Help at Discharge: Family;Friend(s) Type of Home: Independent  living facility Home Access: Level entry     Home Layout: One level     Bathroom Shower/Tub: Occupational psychologist: Standard     Home Equipment: Environmental consultant - 2 wheels;Cane - single point;Bedside commode;Shower seat;Grab bars - tub/shower          Prior Functioning/Environment Level of Independence: Independent        Comments: patient just returned from trip to [place]        OT Problem List: Decreased strength;Decreased range of motion;Decreased activity tolerance;Impaired balance (sitting and/or standing);Decreased coordination;Impaired vision/perception;Decreased cognition;Decreased safety awareness;Decreased knowledge of use of DME or AE;Decreased knowledge of precautions;Impaired sensation;Impaired UE functional use      OT Treatment/Interventions: Self-care/ADL training;Neuromuscular education;Energy conservation;DME and/or AE instruction;Therapeutic activities;Cognitive remediation/compensation;Visual/perceptual remediation/compensation;Patient/family education;Balance training;Manual therapy    OT Goals(Current goals can be found in the care plan section) Acute Rehab OT Goals Patient Stated Goal: per family, patient with trip to [place] planned OT Goal Formulation: With family Time For Goal Achievement: 01/29/18 Potential to Achieve Goals: Good  OT Frequency: Min 3X/week   Barriers to D/C:            Co-evaluation              AM-PAC OT "6 Clicks" Daily Activity     Outcome Measure Help from another person eating meals?: A Little Help from another person taking care of personal grooming?: A Lot Help from another person toileting, which includes using toliet, bedpan, or urinal?: A Lot Help from another person bathing (including washing, rinsing, drying)?: A Lot Help from another person to put on and taking off regular upper body clothing?: A Lot Help from another person to put on and  taking off regular lower body clothing?: A Lot 6 Click Score:  13   End of Session Equipment Utilized During Treatment: Gait belt Nurse Communication: Mobility status  Activity Tolerance: Patient tolerated treatment well Patient left: in chair;with call bell/phone within reach;with chair alarm set  OT Visit Diagnosis: Unsteadiness on feet (R26.81);Muscle weakness (generalized) (M62.81);Hemiplegia and hemiparesis;Other symptoms and signs involving cognitive function Hemiplegia - Right/Left: Right Hemiplegia - dominant/non-dominant: Dominant Hemiplegia - caused by: Nontraumatic intracerebral hemorrhage                Time: 6384-5364 OT Time Calculation (min): 30 min Charges:  OT General Charges $OT Visit: 1 Visit OT Evaluation $OT Eval High Complexity: 1 High OT Treatments $Self Care/Home Management : 8-22 mins  Delight Stare, OT Acute Rehabilitation Services Pager [phone] Office [phone]   Delight Stare 01/15/2018, 8:59 AM

## 2018-01-16 DIAGNOSIS — I5030 Unspecified diastolic (congestive) heart failure: Secondary | ICD-10-CM | POA: Insufficient documentation

## 2018-01-16 DIAGNOSIS — Z720 Tobacco use: Secondary | ICD-10-CM | POA: Insufficient documentation

## 2018-01-16 DIAGNOSIS — I5031 Acute diastolic (congestive) heart failure: Secondary | ICD-10-CM

## 2018-01-16 DIAGNOSIS — D62 Acute posthemorrhagic anemia: Secondary | ICD-10-CM | POA: Insufficient documentation

## 2018-01-16 DIAGNOSIS — E785 Hyperlipidemia, unspecified: Secondary | ICD-10-CM | POA: Diagnosis present

## 2018-01-16 DIAGNOSIS — I611 Nontraumatic intracerebral hemorrhage in hemisphere, cortical: Secondary | ICD-10-CM

## 2018-01-16 LAB — CBC
HCT: 34.6 % — ABNORMAL LOW (ref 36.0–46.0)
Hemoglobin: 10.6 g/dL — ABNORMAL LOW (ref 12.0–15.0)
MCH: 29.4 pg (ref 26.0–34.0)
MCHC: 30.6 g/dL (ref 30.0–36.0)
MCV: 96.1 fL (ref 80.0–100.0)
Platelets: 220 10*3/uL (ref 150–400)
RBC: 3.6 MIL/uL — ABNORMAL LOW (ref 3.87–5.11)
RDW: 12.4 % (ref 11.5–15.5)
WBC: 5.5 10*3/uL (ref 4.0–10.5)
nRBC: 0 % (ref 0.0–0.2)

## 2018-01-16 LAB — BASIC METABOLIC PANEL
ANION GAP: 9 (ref 5–15)
BUN: 9 mg/dL (ref 8–23)
CO2: 25 mmol/L (ref 22–32)
Calcium: 8.6 mg/dL — ABNORMAL LOW (ref 8.9–10.3)
Chloride: 104 mmol/L (ref 98–111)
Creatinine, Ser: 0.74 mg/dL (ref 0.44–1.00)
GFR calc non Af Amer: >60 mL/min (ref >60)
Glucose, Bld: 107 mg/dL — ABNORMAL HIGH (ref 70–99)
Potassium: 3.6 mmol/L (ref 3.5–5.1)
Sodium: 138 mmol/L (ref 135–145)

## 2018-01-16 NOTE — Consult Note (Signed)
Physical Medicine and Rehabilitation Consult Reason for Consult:  Decreased functional mobility Referring Physician:  Dr.Xu  HPI: Courtney Willis is a 83 y.o.right handed female with history of hyperlipidemia and tobacco use.  History taken from chart review and daughter.  Presented 01/13/2018 with aphasia. Per report patient lives alone independent living facility/ [facility] prior to admission. One level home with level entry. Patient was found by a friend wandering about the house half dressed. CT the head reviewed, showing left frontal lobe hemorrhage.  Per report, acute intra-axial hemorrhage in the anterior superior frontal gyrus with extension into the left subarachnoid space. Surrounding edema and regional mass effect with a 4-5 millimeter rightward midline shift.Patient did not receive TPA secondary to hemorrhage.MRI of the brain showed large left frontal lobe intraparenchymal hemorrhage an unchanged 4 mm rightward midline shift. CT angiogram of head and neck with no significant stenosis or large vessel occlusion. Latest follow-up cranial CT scan 01/15/2018 stable. Echocardiogram with ejection fraction of 26% grade 1 diastolic dysfunction. Tolerating a regular diet. Therapy evaluations completed with recommendations of physical medicine rehabilitation consult.  Review of Systems  Unable to perform ROS: Language   Past Medical History:  Diagnosis Date  . Acid reflux 02/26/2008  . Arthritis   . 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 Avitaminosis D 08/12/2014  . Bowel disease 02/16/2008  . Bunion 08/12/2014  . Cancer [facility])    Skin cancer- basal- upper arm , upper chest  . CN (constipation) 08/12/2014  . DD (diverticular disease) 02/16/2008  . Elevated liver enzymes 08/12/2014  . Fibroids, submucosal 08/12/2014   of her lower lip   . Hypercholesteremia 08/12/2014  . 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 Post menopausal syndrome 08/12/2014   Past Surgical History:  Procedure Laterality Date  . BUNIONECTOMY      05/2004, 2007  . CATARACT EXTRACTION     04/2005, 06/2005  . COLONOSCOPY W/ POLYPECTOMY    . COLONOSCOPY WITH PROPOFOL N/A 02/15/2017   Procedure: COLONOSCOPY WITH PROPOFOL;  Surgeon: Jonathon Bellows, MD;  Location: [facility] ENDOSCOPY;  Service: Gastroenterology;  Laterality: N/A;  . HYSTEROSCOPY  2011  . ORIF WRIST FRACTURE Right 08/30/2015   Procedure: OPEN REDUCTION INTERNAL FIXATION (ORIF) RIGHT WRIST FRACTURE AND REPAIR AS NECESSARY;  Surgeon: Roseanne Kaufman, MD;  Location: [facility];  Service: Orthopedics;  Laterality: Right;  . UPPER GI ENDOSCOPY    . WRIST FRACTURE SURGERY Left    06/2006   Family History  Problem Relation Age of Onset  . Rheum arthritis Mother   . Lung cancer Father   . Ulcers Father   . Hodgkin's lymphoma Sister   . AVM Daughter   . Breast cancer Paternal Grandmother    Social History:  reports that she has quit smoking. She has never used smokeless tobacco. She reports current alcohol use of about 3.0 - 4.0 standard drinks of alcohol per week. She reports that she does not use drugs. Allergies: No Known Allergies Medications Prior to Admission  Medication Sig Dispense Refill  . ascorbic acid (VITAMIN C) 500 MG tablet Take 1 tablet by mouth daily.    . Calcium Carbonate (CALCIUM 600 PO) Take 1,200 mg by mouth daily.    . chlorpheniramine (ALLERGY) 4 MG tablet Take 4 mg by mouth 2 (two) times daily as needed for allergies.    . Cholecalciferol (VITAMIN D3) 1000 UNITS CAPS Take 1 capsule by mouth daily.    . fluticasone (FLONASE) 50 MCG/ACT nasal spray Place 2 sprays  into both nostrils daily. (Patient taking differently: Place 2 sprays into both nostrils daily as needed for allergies. ) 16 g 6  . polyethylene glycol (MIRALAX / GLYCOLAX) packet Take 17 g by mouth daily as needed for mild constipation.    . polyvinyl alcohol-povidone (REFRESH) 1.4-0.6 % ophthalmic solution Apply 1 drop to eye daily as needed (dry eyes).     . Propylene Glycol (SYSTANE BALANCE) 0.6 % SOLN  Apply 1 drop to eye as needed.    . timolol (TIMOPTIC) 0.5 % ophthalmic solution Place 1 drop into both eyes at bedtime.  5    Home: Home Living Family/patient expects to be discharged to:: Private residence Living Arrangements: Alone Available Help at Discharge: Family, Friend(s) Type of Home: Independent living facility Home Access: Level entry [place]: One level Bathroom Shower/Tub: Multimedia programmer: [place]: Environmental consultant - 2 wheels, [employer] - single point, Bedside commode, Shower seat, Grab bars - tub/shower  Functional History: Prior Function Level of Independence: Independent Comments: patient just returned from trip to [place] Status:  Mobility: Bed Mobility Overal bed mobility: Needs Assistance Bed Mobility: Supine to Sit Rolling: Min assist Supine to sit: Min guard General bed mobility comments: min guard for safety and initation of mobility, multimodal cueing  Transfers Overall transfer level: Needs assistance Equipment used: 1 person hand held assist Transfers: Sit to/from Stand Sit to Stand: Min assist General transfer comment: min assist for safety and balance  Ambulation/Gait Ambulation/Gait assistance: Min assist Gait Distance (Feet): 60 Feet(x2) Assistive device: 1 person hand held assist Gait Pattern/deviations: Step-through pattern, Decreased stride length, Drifts right/left General Gait Details: patient with poor environmental awareness specific to the right side. Increased assist for stability.  Gait velocity: decreased Gait velocity interpretation: <1.8 ft/sec, indicate of risk for recurrent falls    ADL: ADL Overall ADL's : Needs assistance/impaired Eating/Feeding: Minimal assistance Eating/Feeding Details (indicate cue type and reason): taking a sip of water  Grooming: Oral care, Moderate assistance, Brushing hair, Standing Grooming Details (indicate cue type and reason): maximal multimodal cueing to sequence  and problem solve, min assist in standing  Upper Body Bathing: Moderate assistance, Sitting Lower Body Bathing: Maximal assistance, Sit to/from stand Upper Body Dressing : Moderate assistance, Sitting Lower Body Dressing: Maximal assistance, Sit to/from stand Toilet Transfer: Minimal assistance, Ambulation Toilet Transfer Details (indicate cue type and reason): simulated in room Functional mobility during ADLs: Minimal assistance, Cueing for sequencing, Cueing for safety General ADL Comments: patient requires increased assist for self care tasks due to decreased sequencing, problem sovling, intation, and decreased functional use of R UE   Cognition: Cognition Overall Cognitive Status: Difficult to assess Arousal/Alertness: Awake/alert Orientation Level: Oriented to person, Oriented to place, Disoriented to time, Disoriented to situation, Other (comment) Attention: Sustained Sustained Attention: Impaired Sustained Attention Impairment: Verbal basic, Functional basic Memory: (TBA) Awareness: Impaired Awareness Impairment: Emergent impairment Problem Solving: Impaired Problem Solving Impairment: Functional basic Safety/Judgment: Impaired Cognition Arousal/Alertness: Awake/alert Behavior During Therapy: Flat affect Overall Cognitive Status: Difficult to assess Area of Impairment: Orientation, Memory, Attention, Following commands, Safety/judgement, Awareness, Problem solving Orientation Level: (unable to assess) Current Attention Level: Sustained Following Commands: Follows one step commands with increased time, Follows multi-step commands inconsistently Awareness: Intellectual(difficult to assess) Problem Solving: Slow processing, Decreased initiation, Difficulty sequencing, Requires verbal cues, Requires tactile cues General Comments: pt able to follow simple 1 step commands with increased time, but demosntrates difficulty sequencing and problem sovling through automatic tasks with  maximal multimodal cueing Difficult  to assess due to: Impaired communication  Blood pressure (!) 143/76, pulse 63, temperature 98.6 F (37 C), temperature source Oral, resp. rate 17, height 5\' 9"  (1.753 m), weight 57 kg, SpO2 98 %. Physical Exam  Vitals reviewed. Constitutional: She appears well-developed.  Frail  HENT:  Head: Normocephalic and atraumatic.  Eyes: EOM are normal. Right eye exhibits no discharge. Left eye exhibits no discharge.  Neck: Normal range of motion. Neck supple. No thyromegaly present.  Cardiovascular: Normal rate, regular rhythm and normal heart sounds.  Respiratory: Effort normal and breath sounds normal. No respiratory distress.  GI: Soft. Bowel sounds are normal. She exhibits no distension.  Musculoskeletal:     Comments: No edema or tenderness in extremities  Neurological: She is alert.  Patient is alert/aphasic with some spontaneous speech. Follow commands inconsistently,?  4+/5 throughout. Appears to have right upper extremity dysmetria  Skin: Skin is warm and dry.  Psychiatric:  Unable to assess due to cognition    Results for orders placed or performed during the hospital encounter of 01/13/18 (from the past 24 hour(s))  CBC     Status: Abnormal   Collection Time: 01/16/18  4:41 AM  Result Value Ref Range   WBC 5.5 4.0 - 10.5 K/uL   RBC 3.60 (L) 3.87 - 5.11 MIL/uL   Hemoglobin 10.6 (L) 12.0 - 15.0 g/dL   HCT 34.6 (L) 36.0 - 46.0 %   MCV 96.1 80.0 - 100.0 fL   MCH 29.4 26.0 - 34.0 pg   MCHC 30.6 30.0 - 36.0 g/dL   RDW 12.4 11.5 - 15.5 %   Platelets 220 150 - 400 K/uL   nRBC 0.0 0.0 - 0.2 %  Basic metabolic panel     Status: Abnormal   Collection Time: 01/16/18  4:41 AM  Result Value Ref Range   Sodium 138 135 - 145 mmol/L   Potassium 3.6 3.5 - 5.1 mmol/L   Chloride 104 98 - 111 mmol/L   CO2 25 22 - 32 mmol/L   Glucose, Bld 107 (H) 70 - 99 mg/dL   BUN 9 8 - 23 mg/dL   Creatinine, Ser 0.74 0.44 - 1.00 mg/dL   Calcium 8.6 (L) 8.9 - 10.3  mg/dL   GFR calc non Af Amer >60 >60 mL/min   GFR calc Af Amer >60 >60 mL/min   Anion gap 9 5 - 15   Ct Head Wo Contrast  Result Date: 01/15/2018 CLINICAL DATA:  Follow up intracranial hemorrhage. EXAM: CT HEAD WITHOUT CONTRAST TECHNIQUE: Contiguous axial images were obtained from the base of the skull through the vertex without intravenous contrast. COMPARISON:  CT and MRI head January 13, 2018. FINDINGS: BRAIN: Evolving 3.7 x 3.8 x 4.5 cm (volume = 33 cc) LEFT frontal intraparenchymal hematoma was 44 cc. Surrounding low-density vasogenic edema. Regional mass effect deforming the LEFT frontal horn lateral ventricle without midline shift. Small volume LEFT frontal subarachnoid hemorrhage. Stable dense LEFT parieto-occipital subdural hematoma measuring to 4 mm. No acute large vascular territory infarct. No parenchymal brain volume loss for age. Basal cisterns are patent. VASCULAR: Minimal calcific atherosclerosis of the carotid siphons. SKULL: No skull fracture. No significant scalp soft tissue swelling. SINUSES/ORBITS: Trace paranasal sinus mucosal thickening. Mastoid air cells are well aerated.The included ocular globes and orbital contents are non-suspicious. OTHER: None. IMPRESSION: 1. Involving acute 33 cc LEFT frontal hematoma was 44 cc. Regional mass effect without midline shift. 2. Stable small volume LEFT frontal subarachnoid hemorrhage. Stable LEFT parietooccipital 4 mm subdural  hematoma. Electronically Signed   By: Elon Alas M.D.   On: 01/15/2018 05:28    Assessment/Plan: Diagnosis: Left frontal lobe hemorrhage/SAH Labs and images (see above) independently reviewed.  Records reviewed and summated above. Stroke: Continue secondary stroke prophylaxis and Risk Factor Modification listed below:   Blood Pressure Management:  Continue current medication with prn's with permisive HTN per primary team Tobacco abuse:   ?Right hemiparesis:  Motor recovery: Fluoxetine   1. Does the need  for close, 24 hr/day medical supervision in concert with the patient's rehab needs make it unreasonable for this patient to be served in a less intensive setting? Yes  2. Co-Morbidities requiring supervision/potential complications: diastolic dysfunction (monitor for signs and symptoms of fluid overload), hyperlipidemia, tobacco use (counsel), ABLA (repeat labs, transfuse to ensure appropriate perfusion for increased activity tolerance) 3. Due to safety, disease management, medication administration and patient education, does the patient require 24 hr/day rehab nursing? Yes 4. Does the patient require coordinated care of a physician, rehab nurse, PT (1-2 hrs/day, 5 days/week), OT (1-2 hrs/day, 5 days/week) and SLP (1-2 hrs/day, 5 days/week) to address physical and functional deficits in the context of the above medical diagnosis(es)? Yes Addressing deficits in the following areas: balance, endurance, locomotion, strength, transferring, bathing, dressing, toileting, cognition, speech, language and psychosocial support 5. Can the patient actively participate in an intensive therapy program of at least 3 hrs of therapy per day at least 5 days per week? Yes 6. The potential for patient to make measurable gains while on inpatient rehab is excellent 7. Anticipated functional outcomes upon discharge from inpatient rehab are supervision  with PT, supervision with OT, min assist with SLP. 8. Estimated rehab length of stay to reach the above functional goals is: 12-15 days. 9. Anticipated D/C setting: Home 10. Anticipated post D/C treatments: HH therapy and Home excercise program 11. Overall Rehab/Functional Prognosis: good  RECOMMENDATIONS: This patient's condition is appropriate for continued rehabilitative care in the following setting: Patient will likely require 24/7 supervision at discharge, CIR if caregiver support available. Patient has agreed to participate in recommended program. Potentially Note  that insurance prior authorization may be required for reimbursement for recommended care.  Comment: Rehab Admissions Coordinator to follow up.   I have personally performed a face to face diagnostic evaluation, including, but not limited to relevant history and physical exam findings, of this patient and developed relevant assessment and plan.  Additionally, I have reviewed and concur with the physician assistant's documentation above.   Delice Lesch, MD, ABPMR Lavon Paganini Angiulli, PA-C 01/16/2018

## 2018-01-16 NOTE — Progress Notes (Signed)
Pt arrived on floor. MD notified.

## 2018-01-16 NOTE — Progress Notes (Addendum)
STROKE TEAM PROGRESS NOTE   SUBJECTIVE (INTERVAL HISTORY) Her daughter is at the bedside.  Patient sitting up in the bed. Frustrated d/t lack of language. She recently traveled to [place] where she hiked 6 miles/day.   OBJECTIVE Vitals:   01/16/18 0800 01/16/18 1200 01/16/18 1401 01/16/18 1526  BP: 135/82 (!) 155/83 (!) 141/73 (!) 141/79  Pulse: 67 66 67 70  Resp:  '18 16 17  ' Temp: 98.8 F (37.1 C) 98.3 F (36.8 C) 98.5 F (36.9 C) 99.4 F (37.4 C)  TempSrc: Oral Oral Oral Oral  SpO2: 97% 99% 99%   Weight:      Height:        CBC:  Recent Labs  Lab 01/13/18 1810  01/15/18 0152 01/16/18 0441  WBC 8.6   < > 6.2 5.5  NEUTROABS 6.9  --   --   --   HGB 12.5   < > 10.2* 10.6*  HCT 40.1   < > 33.6* 34.6*  MCV 96.4   < > 96.6 96.1  PLT 278   < > 231 220   < > = values in this interval not displayed.    Basic Metabolic Panel:  Recent Labs  Lab 01/15/18 0152 01/16/18 0441  NA 134* 138  K 3.6 3.6  CL 102 104  CO2 22 25  GLUCOSE 104* 107*  BUN 12 9  CREATININE 0.70 0.74  CALCIUM 8.4* 8.6*    Lipid Panel:     Component Value Date/Time   CHOL 181 01/14/2018 0605   CHOL 206 (H) 07/20/2017 1059   TRIG 65 01/14/2018 0605   HDL 74 01/14/2018 0605   HDL 83 07/20/2017 1059   CHOLHDL 2.4 01/14/2018 0605   VLDL 13 01/14/2018 0605   LDLCALC 94 01/14/2018 0605   LDLCALC 106 (H) 07/20/2017 1059   HgbA1c:  Lab Results  Component Value Date   HGBA1C 5.3 01/14/2018   Urine Drug Screen: No results found for: LABOPIA, COCAINSCRNUR, LABBENZ, AMPHETMU, THCU, LABBARB  Alcohol Level No results found for: [place] Code Stroke Wo Contrast 01/13/2018   1. Acute intra-axial hemorrhage in the anterior superior frontal gyrus with extension into the left subarachnoid space. Intra-axial hemorrhage volume estimated at 44 mL.  2. Surrounding edema and regional mass effect with 4-5 mm of rightward midline shift.  3. No intraventricular extension or ventriculomegaly.  4.  ASPECTS is not applicable, acute hemorrhage.   Mr Brain Wo Contrast 01/13/2018 1. Large left frontal lobe intraparenchymal hemorrhage trauma unchanged in size from earlier CT, allowing for differences in modality.  2. Unchanged 4 mm rightward midline shift.  3. Susceptibility effects from blood obscure diffusion-weighted imaging at the infarct site. No evidence of amyloid angiopathy.   Ct Angio Head W Or Wo Contrast 01/14/2018 1. No emergent large vessel occlusion, flow-limiting stenosis or vascular malformation. Complete circle-of-Willis.   Ct Angio Neck W Or Wo Contrast 01/14/2018 1. No hemodynamically significant stenosis ICA's.  2. Patent vertebral arteries.   Ct Head Wo Contrast 01/15/2018 1. Involving acute 33 cc LEFT frontal hematoma was 44 cc. Regional mass effect without midline shift. 2. Stable small volume LEFT frontal subarachnoid hemorrhage. Stable LEFT parietooccipital 4 mm subdural hematoma.   Dg Chest Port 1 View 01/13/2018 IMPRESSION:  Pulmonary hyperinflation suspected. No acute cardiopulmonary abnormality.    PHYSICAL EXAM General - Well nourished, well developed, in no apparent distress. Cardiovascular - Regular rate and rhythm.  Neuro - awake, alert, eyes open, eyes midline. Blinking  to visual threat more on the left than right.  Able to track objects on the left visual field.  Expressive aphasia, improved, able to repeat simple sentences, able to name objects but not parts of objects, no spontaneous speech output, follows limited simple commands, unable to show 2 fingers to command. Able to mimic.  Right nasolabial fold flattening, tongue midline.  Left upper and lower extremity 5/5, right upper extremity 4/5, right lower extremity 4+/5.  DTR 1+, no Babinski.  Sensation intact bilaterally to touch and cold. coordination not corporative.  Gait not tested.   ASSESSMENT/PLAN Ms. BERNADENE GARSIDE is a 83 y.o. female with history of HLD presenting with aphasia. She did  not receive IV t-PA due to hemorrhage.  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   MRI head - Large left frontal lobe intraparenchymal hemorrhage. Unchanged 4 mm rightward midline shift.   CTA H&N - unremarkable, stable hematoma and midline shift  Repeat CT showed contracting of hematoma and no significant MLS  2D Echo - EF 65-70%  LDL - 94  HgbA1c - 5.3  VTE prophylaxis - SCDs  Diet - regular, thin liquids   No antithrombotic prior to admission, now on No antithrombotic.  Given concern of CAA, recommend to avoid antithrombotics life long.  Therapy recommendations:  CIR  Disposition:  Pending  Transfer to the floor  D/c IVF  Cerebral edema  CT and MRI showed midline shift 4 mm but stable  CT repeat showed contracting of hematoma and no significant MLS  Clinically stable, no need for 3% at this time  Continue monitoring  Possible CAA  No history of hypertension  BP stable on admission and during hospitalization  With lobar hemorrhage, CAA most likely although lack of diffuse CMBs.  BP measurement  Avoid antithrombotics  BP measurement  Stable  No BP meds needed at this time . SBP goal < 160 mmHg . Long-term BP goal normotensive  Hyperlipidemia  Lipid lowering medication PTA:  none  LDL 94, goal < 70  Consider low-dose statin at discharge - Pravachol 20  Other Stroke Risk Factors  Advanced age  Former cigarette smoker - quit  Other Active Problems  Hyponatremia, resolved Na 140->134 -> [facility] day # [place], MSN, APRN, ANVP-BC, AGPCNP-BC Advanced Practice Stroke Nurse [place] for Schedule & Pager information 01/16/2018 3:44 PM   ATTENDING NOTE: I reviewed above note and agree with the assessment and plan. Pt was seen and examined.   Met patient daughter and the boyfriend at  bedside, answered older questions.  Patient sitting in chair with breakfast.  She still has expressive aphasia and lack of spontaneous speech, however able to repeat.  Speech therapy has been following.  PT/OT recommend CIR.  BP stable, no hypertension.  Will transfer to floor today.  Rosalin Hawking, MD PhD Stroke Neurology 01/16/2018 9:33 PM     To contact Stroke Continuity provider, please refer to [email or web address] After hours, contact General Neurology

## 2018-01-16 NOTE — Progress Notes (Signed)
IP rehab admissions - I am following for potential acute inpatient rehab admission.  I will contact family to discuss rehab options.  Patient cannot have a full conversation with me.  Call me for questions.  [phone]

## 2018-01-17 LAB — CBC
HCT: 34.7 % — ABNORMAL LOW (ref 36.0–46.0)
Hemoglobin: 11.1 g/dL — ABNORMAL LOW (ref 12.0–15.0)
MCH: 29.9 pg (ref 26.0–34.0)
MCHC: 32 g/dL (ref 30.0–36.0)
MCV: 93.5 fL (ref 80.0–100.0)
NRBC: 0 % (ref 0.0–0.2)
Platelets: 223 10*3/uL (ref 150–400)
RBC: 3.71 MIL/uL — ABNORMAL LOW (ref 3.87–5.11)
RDW: 12.3 % (ref 11.5–15.5)
WBC: 5.9 10*3/uL (ref 4.0–10.5)

## 2018-01-17 LAB — BASIC METABOLIC PANEL
Anion gap: 9 (ref 5–15)
BUN: 12 mg/dL (ref 8–23)
CO2: 25 mmol/L (ref 22–32)
Calcium: 8.7 mg/dL — ABNORMAL LOW (ref 8.9–10.3)
Chloride: 102 mmol/L (ref 98–111)
Creatinine, Ser: 0.8 mg/dL (ref 0.44–1.00)
GFR calc Af Amer: >60 mL/min (ref >60)
GFR calc non Af Amer: >60 mL/min (ref >60)
Glucose, Bld: 111 mg/dL — ABNORMAL HIGH (ref 70–99)
Potassium: 3.8 mmol/L (ref 3.5–5.1)
SODIUM: 136 mmol/L (ref 135–145)

## 2018-01-17 NOTE — Progress Notes (Signed)
IP rehab admissions - I met with patient and her daughter, [name], at the bedside. [name] and her daughter plan to provide care and supervision after rehab discharge.  I have approval for acute inpatient rehab admission.  I am waiting on rehab bed availability.  I will update all once I have a rehab bed available for admission.  Call me for questions.  [phone]

## 2018-01-17 NOTE — Care Management Important Message (Signed)
Important Message  Patient Details  Name: Courtney Willis MRN: [id number] Date of Birth: 27-Feb-1934   Medicare Important Message Given:  Yes    Orbie Pyo 01/17/2018, 3:39 PM

## 2018-01-17 NOTE — Discharge Summary (Addendum)
Stroke Discharge Summary  Patient ID: Courtney Willis   MRN: [id number]      DOB: 09/24/1934  Date of Admission: 01/13/2018 Date of Discharge: 01/18/2018  Attending Physician:  Rosalin Hawking, MD, Stroke MD Consultant(s):   Delice Lesch, MD (Physical Medicine & Rehabtilitation)  Patient's PCP:  Mar Daring, PA-C  Discharge Diagnoses:  Principal Problem:   ICH (intracerebral hemorrhage) ([place]) Active Problems:   Hyperlipidemia  Past Medical History:  Diagnosis Date  . Acid reflux 02/26/2008  . Arthritis   . 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 Avitaminosis D 08/12/2014  . Bowel disease 02/16/2008  . Bunion 08/12/2014  . Cancer [facility])    Skin cancer- basal- upper arm , upper chest  . CN (constipation) 08/12/2014  . DD (diverticular disease) 02/16/2008  . Elevated liver enzymes 08/12/2014  . Fibroids, submucosal 08/12/2014   of her lower lip   . Hypercholesteremia 08/12/2014  . 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 Post menopausal syndrome 08/12/2014   Past Surgical History:  Procedure Laterality Date  . BUNIONECTOMY     05/2004, 2007  . CATARACT EXTRACTION     04/2005, 06/2005  . COLONOSCOPY W/ POLYPECTOMY    . COLONOSCOPY WITH PROPOFOL N/A 02/15/2017   Procedure: COLONOSCOPY WITH PROPOFOL;  Surgeon: Jonathon Bellows, MD;  Location: [facility] ENDOSCOPY;  Service: Gastroenterology;  Laterality: N/A;  . HYSTEROSCOPY  2011  . ORIF WRIST FRACTURE Right 08/30/2015   Procedure: OPEN REDUCTION INTERNAL FIXATION (ORIF) RIGHT WRIST FRACTURE AND REPAIR AS NECESSARY;  Surgeon: Roseanne Kaufman, MD;  Location: [place];  Service: Orthopedics;  Laterality: Right;  . UPPER GI ENDOSCOPY    . WRIST FRACTURE SURGERY Left    06/2006    Medications to be continued on Rehab Allergies as of 01/18/2018   No Known Allergies     Medication List    TAKE these medications   ALLERGY 4 MG tablet Generic drug:  chlorpheniramine Take 4 mg by mouth 2 (two) times daily as needed for allergies.   ascorbic acid 500 MG  tablet Commonly known as:  VITAMIN C Take 1 tablet by mouth daily.   CALCIUM 600 PO Take 1,200 mg by mouth daily.   fluticasone 50 MCG/ACT nasal spray Commonly known as:  FLONASE Place 2 sprays into both nostrils daily as needed for allergies.   polyethylene glycol packet Commonly known as:  MIRALAX / GLYCOLAX Take 17 g by mouth daily as needed for mild constipation.   pravastatin 20 MG tablet Commonly known as:  PRAVACHOL Take 1 tablet (20 mg total) by mouth daily at 6 PM.   REFRESH 1.4-0.6 % ophthalmic solution Generic drug:  polyvinyl alcohol-povidone Apply 1 drop to eye daily as needed (dry eyes).   senna-docusate 8.6-50 MG tablet Commonly known as:  Senokot-S Take 1 tablet by mouth 2 (two) times daily.   SYSTANE BALANCE 0.6 % Soln Generic drug:  Propylene Glycol Apply 1 drop to eye as needed.   timolol 0.5 % ophthalmic solution Commonly known as:  TIMOPTIC Place 1 drop into both eyes at bedtime.   Vitamin D3 25 MCG (1000 UT) Caps Take 1 capsule by mouth daily.       LABORATORY STUDIES CBC    Component Value Date/Time   WBC 6.2 01/18/2018 0434   RBC 3.74 (L) 01/18/2018 0434   HGB 11.2 (L) 01/18/2018 0434   HGB 12.9 07/20/2017 1059   HCT 35.1 (L) 01/18/2018 0434   HCT 39.9 07/20/2017 1059   PLT 241  01/18/2018 0434   PLT 299 07/20/2017 1059   MCV 93.9 01/18/2018 0434   MCV 93 07/20/2017 1059   MCH 29.9 01/18/2018 0434   MCHC 31.9 01/18/2018 0434   RDW 12.3 01/18/2018 0434   RDW 13.4 07/20/2017 1059   LYMPHSABS 0.8 01/13/2018 1810   LYMPHSABS 1.5 07/20/2017 1059   MONOABS 0.8 01/13/2018 1810   EOSABS 0.0 01/13/2018 1810   EOSABS 0.0 07/20/2017 1059   BASOSABS 0.0 01/13/2018 1810   BASOSABS 0.0 07/20/2017 1059   CMP    Component Value Date/Time   NA 134 (L) 01/18/2018 0434   NA 141 07/20/2017 1059   K 3.5 01/18/2018 0434   CL 101 01/18/2018 0434   CO2 24 01/18/2018 0434   GLUCOSE 106 (H) 01/18/2018 0434   BUN 13 01/18/2018 0434   BUN 14  07/20/2017 1059   CREATININE 0.62 01/18/2018 0434   CALCIUM 8.6 (L) 01/18/2018 0434   PROT 6.7 01/13/2018 1810   PROT 6.5 07/20/2017 1059   ALBUMIN 4.0 01/13/2018 1810   ALBUMIN 4.2 07/20/2017 1059   AST 21 01/13/2018 1810   ALT 16 01/13/2018 1810   ALKPHOS 56 01/13/2018 1810   BILITOT 0.8 01/13/2018 1810   BILITOT 0.7 07/20/2017 1059   GFRNONAA >60 01/18/2018 0434   GFRAA >60 01/18/2018 0434   COAGS Lab Results  Component Value Date   INR 0.93 01/13/2018   Lipid Panel    Component Value Date/Time   CHOL 181 01/14/2018 0605   CHOL 206 (H) 07/20/2017 1059   TRIG 65 01/14/2018 0605   HDL 74 01/14/2018 0605   HDL 83 07/20/2017 1059   CHOLHDL 2.4 01/14/2018 0605   VLDL 13 01/14/2018 0605   LDLCALC 94 01/14/2018 0605   LDLCALC 106 (H) 07/20/2017 1059   HgbA1C  Lab Results  Component Value Date   HGBA1C 5.3 01/14/2018   Urinalysis    Component Value Date/Time   BILIRUBINUR neg 07/20/2017 1058   PROTEINUR Negative 07/20/2017 1058   UROBILINOGEN 0.2 07/20/2017 1058   NITRITE neg 07/20/2017 1058   LEUKOCYTESUR Negative 07/20/2017 1058   Urine Drug Screen No results found for: LABOPIA, COCAINSCRNUR, LABBENZ, AMPHETMU, THCU, LABBARB  Alcohol Level No results found for: [facility]   SIGNIFICANT DIAGNOSTIC STUDIES Ct Head Code Stroke Wo Contrast 01/13/2018   1. Acute intra-axial hemorrhage in the anterior superior frontal gyrus with extension into the left subarachnoid space. Intra-axial hemorrhage volume estimated at 44 mL.  2. Surrounding edema and regional mass effect with 4-5 mm of rightward midline shift.  3. No intraventricular extension or ventriculomegaly.  4. ASPECTS is not applicable, acute hemorrhage.   Mr Brain Wo Contrast 01/13/2018 1. Large left frontal lobe intraparenchymal hemorrhage trauma unchanged in size from earlier CT, allowing for differences in modality.  2. Unchanged 4 mm rightward midline shift.  3. Susceptibility effects from blood obscure  diffusion-weighted imaging at the infarct site. No evidence of amyloid angiopathy.   Ct Angio Head W Or Wo Contrast 01/14/2018 1. No emergent large vessel occlusion, flow-limiting stenosis or vascular malformation. Complete circle-of-Willis.   Ct Angio Neck W Or Wo Contrast 01/14/2018 1. No hemodynamically significant stenosis ICA's.  2. Patent vertebral arteries.   Ct Head Wo Contrast 01/15/2018 1. Involving acute 33 cc LEFT frontal hematoma was 44 cc. Regional mass effect without midline shift. 2. Stable small volume LEFT frontal subarachnoid hemorrhage. Stable LEFT parietooccipital 4 mm subdural hematoma.   Dg Chest Port 1 View 01/13/2018 IMPRESSION:  Pulmonary hyperinflation suspected. No  acute cardiopulmonary abnormality.     2D Echocardiogram  - Left ventricle: The cavity size was normal. Systolic function wasvigorous. The estimated ejection fraction was in the range of 65%to 70%. Wall motion was normal; there were no regional wallmotion abnormalities. Doppler parameters are consistent withabnormal left ventricular relaxation (grade 1 diastolicdysfunction). - Aortic valve: There was mild regurgitation. - Left atrium: The atrium was moderately dilated. - Pulmonary arteries: Systolic pressure was mildly increased. PApeak pressure: 40 mm Hg (S). Impressions:  No cardiac source of emboli was indentified.   HISTORY OF PRESENT ILLNESS Courtney Willis is an 83 y.o. female  With PMH of HLD, skin cancer who presented to [facility] ED as a code stroke for aphasia. Per EMS patient has missed phone calls all day. A friend knocked on patients door this morning at 1030 am and she did not answer. This afternoon about 1700 a friend came to take her to dinner and she was found wandering around the house half dressed. Unknown time of LSW. No prior history of stroke. CTH showed an acute intra-axial hemorrhage anterior superior left frontal gyrus (44 ml) with 4-5 mm of rightward midline shift. BP:  146/70 BG: 127. Modified Rankin: Rankin Score=0. NIHSS:10. Intracerebral Hemorrhage (ICH) Score:  3. She was admitted to the neuro ICU for further evaluation and treatment.     HOSPITAL COURSE Courtney Willis is a 83 y.o. female with history of HLD presenting with aphasia.   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   MRI head - Large left frontal lobe intraparenchymal hemorrhage. Unchanged 4 mm rightward midline shift.   CTA H&N - unremarkable, stable hematoma and midline shift  Repeat CT showed contracting of hematoma and no significant MLS  2D Echo - EF 65-70%  LDL - 94  HgbA1c - 5.3  No antithrombotic prior to admission, now on No antithrombotic.  Given concern of CAA, recommend to avoid antithrombotics life long.  Therapy recommendations:  CIR  Disposition:  CIR  Cerebral edema  CT and MRI showed midline shift 4 mm but stable  CT repeat showed contracting of hematoma and no significant MLS  Continue monitoring  Possible CAA  No history of hypertension  BP stable on admission and during hospitalization  With lobar hemorrhage, CAA most likely although lack of diffuse CMBs.  Avoid antithrombotics  BP measurement. No HTN  Stable  No BP meds needed at this time  SBP goal < 160 mmHg  BP goal normotensive  Hyperlipidemia  Lipid lowering medication PTA:  none  LDL 94, goal < 70  Add statin at discharge - Pravachol 20  Other Stroke Risk Factors  Advanced age  Former cigarette smoker - quit  Other Active Problems  Hyponatremia, resolved Na    DISCHARGE EXAM Blood pressure 125/71, pulse 72, temperature 98.6 F (37 C), temperature source Oral, resp. rate 18, height 5\' 9"  (1.753 m), weight 57 kg, SpO2 98 %. General - Well nourished, well developed, in no apparent distress. Cardiovascular -  Regular rate and rhythm. Neuro - sleeping but easily awakens to voice, alert, eyes open, eyes midline. Blinking to visual threat on the left, not on the right.  Able to track objects on the left visual field.  Expressive aphasia, able to repeat words and sentences, able to name some objects but not parts of objects, limited spontaneous speech  output (today spontaneously responded to plans to go to rehab with glee), follows limited simple commands, unable to show 2 fingers to command. Able to mimic.  Right nasolabial fold flattening, tongue midline.  Left upper and lower extremity 5/5, right upper extremity 4/5, right lower extremity 4+/5.  DTR 1+, no Babinski.  Sensation intact bilaterally to touch and cold. coordination not corporative.  Gait not tested.  Discharge Diet  Regular diet thin liquids  DISCHARGE PLAN  Disposition:  Transfer to [place] for ongoing PT, OT and ST  Due to hemorrhage and risk of bleeding, do not take aspirin, aspirin-containing medications, or ibuprofen products   Recommend ongoing risk factor control by Primary Care Physician at time of discharge from inpatient rehabilitation.  Follow-up Mar Daring, PA-C in 2 weeks following discharge from rehab.  Follow-up in [facility] in 4 weeks following discharge from rehab, office to schedule an appointment.   35 minutes were spent preparing discharge.  Burnetta Sabin, MSN, APRN, ANVP-BC, AGPCNP-BC Advanced Practice Stroke Nurse [place] for Schedule & Pager information 01/18/2018 11:19 AM   ATTENDING NOTE: I reviewed above note and agree with the assessment and plan. Pt was seen and examined.   Patient no acute event overnight.  Neuro stable, still has expressive aphasia, no significant change from yesterday.  PT/OT/speech recommend inpatient rehab.  Will transfer to rehab today.  Again, patient bleeding concerning for CAA, recommend  no antithrombotics lifelong.  Continue BP control and management at home.  BP goal less than 140.  Patient will follow with stroke clinic in [place] in 4 weeks.  Rosalin Hawking, MD PhD Stroke Neurology 01/18/2018 4:41 PM

## 2018-01-17 NOTE — Progress Notes (Addendum)
STROKE TEAM PROGRESS NOTE   SUBJECTIVE (INTERVAL HISTORY) Her boyfriend is at the bedside.  Patient sitting up in the chair at bedside. State she is the same as yesterday. Has been approved for CIR; awaiting bed.    OBJECTIVE Vitals:   01/17/18 0021 01/17/18 0412 01/17/18 0853 01/17/18 1121  BP: 125/72 112/80 (!) 144/85 (!) 156/81  Pulse: 64 62 73 74  Resp: 20 20 20 15   Temp: 98.2 F (36.8 C) 97.9 F (36.6 C) 99.1 F (37.3 C) 98.7 F (37.1 C)  TempSrc: Oral Oral Oral Oral  SpO2: 98% 97% 99% 96%  Weight:      Height:        CBC:  Recent Labs  Lab 01/13/18 1810  01/16/18 0441 01/17/18 0432  WBC 8.6   < > 5.5 5.9  NEUTROABS 6.9  --   --   --   HGB 12.5   < > 10.6* 11.1*  HCT 40.1   < > 34.6* 34.7*  MCV 96.4   < > 96.1 93.5  PLT 278   < > 220 223   < > = values in this interval not displayed.   Basic Metabolic Panel:  Recent Labs  Lab 01/16/18 0441 01/17/18 0432  NA 138 136  K 3.6 3.8  CL 104 102  CO2 25 25  GLUCOSE 107* 111*  BUN 9 12  CREATININE 0.74 0.80  CALCIUM 8.6* 8.7*    Lipid Panel:     Component Value Date/Time   CHOL 181 01/14/2018 0605   CHOL 206 (H) 07/20/2017 1059   TRIG 65 01/14/2018 0605   HDL 74 01/14/2018 0605   HDL 83 07/20/2017 1059   CHOLHDL 2.4 01/14/2018 0605   VLDL 13 01/14/2018 0605   LDLCALC 94 01/14/2018 0605   LDLCALC 106 (H) 07/20/2017 1059   HgbA1c:  Lab Results  Component Value Date   HGBA1C 5.3 01/14/2018    IMAGING Ct Head Code Stroke Wo Contrast 01/13/2018   1. Acute intra-axial hemorrhage in the anterior superior frontal gyrus with extension into the left subarachnoid space. Intra-axial hemorrhage volume estimated at 44 mL.  2. Surrounding edema and regional mass effect with 4-5 mm of rightward midline shift.  3. No intraventricular extension or ventriculomegaly.  4. ASPECTS is not applicable, acute hemorrhage.   Mr Brain Wo Contrast 01/13/2018 1. Large left frontal lobe intraparenchymal hemorrhage trauma  unchanged in size from earlier CT, allowing for differences in modality.  2. Unchanged 4 mm rightward midline shift.  3. Susceptibility effects from blood obscure diffusion-weighted imaging at the infarct site. No evidence of amyloid angiopathy.   Ct Angio Head W Or Wo Contrast 01/14/2018 1. No emergent large vessel occlusion, flow-limiting stenosis or vascular malformation. Complete circle-of-Willis.   Ct Angio Neck W Or Wo Contrast 01/14/2018 1. No hemodynamically significant stenosis ICA's.  2. Patent vertebral arteries.   Ct Head Wo Contrast 01/15/2018 1. Involving acute 33 cc LEFT frontal hematoma was 44 cc. Regional mass effect without midline shift. 2. Stable small volume LEFT frontal subarachnoid hemorrhage. Stable LEFT parietooccipital 4 mm subdural hematoma.   Dg Chest Port 1 View 01/13/2018 IMPRESSION:  Pulmonary hyperinflation suspected. No acute cardiopulmonary abnormality.   2D Echocardiogram  - Left ventricle: The cavity size was normal. Systolic function was vigorous. The estimated ejection fraction was in the range of 65% to 70%. Wall motion was normal; there were no regional wall motion abnormalities. Doppler parameters are consistent with abnormal left ventricular relaxation (grade 1 diastolic  dysfunction). - Aortic valve: There was mild regurgitation. - Left atrium: The atrium was moderately dilated. - Pulmonary arteries: Systolic pressure was mildly increased. PA peak pressure: 40 mm Hg (S). Impressions:  No cardiac source of emboli was indentified.   PHYSICAL EXAM General - Well nourished, well developed, in no apparent distress. Cardiovascular - Regular rate and rhythm.  Neuro - awake, alert, eyes open, eyes midline. Blinking to visual threat on the left, not on the right.  Able to track objects on the left visual field.  Expressive aphasia, able to repeat words and sentences, able to name some objects but not parts of objects, limited spontaneous speech  output, follows limited simple commands, unable to show 2 fingers to command. Able to mimic.  Right nasolabial fold flattening, tongue midline.  Left upper and lower extremity 5/5, right upper extremity 4/5, right lower extremity 4+/5.  DTR 1+, no Babinski.  Sensation intact bilaterally to touch and cold. coordination not corporative.  Gait not tested.   ASSESSMENT/PLAN [name] is a 83 y.o. female with history of HLD presenting with aphasia. She did not receive IV t-PA due to hemorrhage.  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   MRI head - Large left frontal lobe intraparenchymal hemorrhage. Unchanged 4 mm rightward midline shift.   CTA H&N - unremarkable, stable hematoma and midline shift  Repeat CT showed contracting of hematoma and no significant MLS  2D Echo - EF 65-70%. No SOE  LDL - 94  HgbA1c - 5.3  VTE prophylaxis - SCDs  Diet - regular, thin liquids   No antithrombotic prior to admission, now on No antithrombotic.  Given concern of CAA, recommend to avoid antithrombotics life long.  Therapy recommendations:  CIR  Disposition:  Pending - hope for CIR bed tomorrow  Cerebral edema, stable  CT and MRI showed midline shift 4 mm but stable  CT repeat showed contracting of hematoma and no significant MLS  Clinically stable  Possible CAA  No history of hypertension  BP stable on admission and during hospitalization  With lobar hemorrhage, CAA most likely although lack of diffuse CMBs.  BP measurement  Avoid antithrombotics  BP measurement  Stable  No BP meds needed at this time . SBP goal < 160 mmHg . Long-term BP goal normotensive  Hyperlipidemia  Lipid lowering medication PTA:  none  LDL 94, goal < 70  Consider low-dose statin at discharge - Pravachol 20  Other Stroke Risk Factors  Advanced age  Former cigarette smoker -  quit  Other Active Problems  Hyponatremia, resolved [facility] day # [place], MSN, APRN, ANVP-BC, AGPCNP-BC Advanced Practice Stroke Nurse [place] for Schedule & Pager information 01/17/2018 3:57 PM   ATTENDING NOTE: I reviewed above note and agree with the assessment and plan. Pt was seen and examined.   Patient sitting in chair, boyfriend at bedside.  Still has expressive aphasia, not able to have spontaneous speech, able to have single word answers, but able to repeat but not able to name.  No significant change from yesterday.  No motor deficit at this time.  CBC and BMP unremarkable.  PT/OT recommend CIR, currently waiting for CIR bed.  Recommend to avoid antiplatelet or anticoagulation.  Put on low-dose Pravachol.  Rosalin Hawking, MD PhD Stroke Neurology 01/17/2018 6:08 PM  To contact Stroke Continuity provider, please refer to [email or web address] After hours, contact General Neurology

## 2018-01-17 NOTE — Progress Notes (Signed)
Physical Therapy Treatment Patient Details Name: Courtney Willis MRN: [id number] DOB: [birth year] Today's Date: 01/17/2018    History of Present Illness 83 y.o. female with history of HLD presenting with aphasia. She did not receive IV t-PA due to hemorrhage. Imaging revealed large left frontal lobe intraparenchymal hemorrhage    PT Comments    Pt alert and oriented x2 this am.  She required re-orientation for place and situation.  Pt presents with short term memory deficits during session and becomes frustrated when she cannot remember.  Pt continues to require min assistance for gait training and presents with LOB during vertical and horizontal head turns.  Pt continues to benefit from aggressive therapies at CIR.  No family present during session to participate in goal setting and d/c planning.  Will continue PT  In acute setting with emphasis on balance training.    Follow Up Recommendations  CIR     Equipment Recommendations  None recommended by PT    Recommendations for Other Services Rehab consult     Precautions / Restrictions Precautions Precautions: Fall Restrictions Weight Bearing Restrictions: No    Mobility  Bed Mobility Overal bed mobility: Needs Assistance Bed Mobility: Supine to Sit     Supine to sit: Min guard     General bed mobility comments: min guard for safety and initation of mobility, multimodal cueing   Transfers Overall transfer level: Needs assistance Equipment used: None Transfers: Sit to/from Stand Sit to Stand: Min guard         General transfer comment: min guard for safety, postural sway noted in standing and reaching for railing on bed observed.    Ambulation/Gait Ambulation/Gait assistance: Min assist Gait Distance (Feet): 70 Feet Assistive device: None Gait Pattern/deviations: Step-through pattern;Decreased stride length;Drifts right/left;Staggering right;Staggering left Gait velocity: decreased   General Gait Details: patient  with poor environmental awareness specific to the right side. Increased assist for stability. LOB noted with vertical and horizontal head turns.     Stairs             Wheelchair Mobility    Modified Rankin (Stroke Patients Only)       Balance     Sitting balance-Leahy Scale: Fair       Standing balance-Leahy Scale: Poor Standing balance comment: hands on assist for stability                            Cognition Arousal/Alertness: Awake/alert Behavior During Therapy: Flat affect Overall Cognitive Status: Difficult to assess Area of Impairment: Orientation;Memory;Attention;Following commands;Safety/judgement;Awareness;Problem solving                 Orientation Level: Disoriented to;Place;Situation Current Attention Level: Sustained Memory: Decreased recall of precautions;Decreased short-term memory Following Commands: Follows one step commands with increased time;Follows multi-step commands inconsistently   Awareness: Intellectual Problem Solving: Slow processing;Decreased initiation;Difficulty sequencing;Requires verbal cues;Requires tactile cues General Comments: pt able to follow simple 1 step commands with increased time, but demosntrates difficulty sequencing and problem sovling through automatic tasks with maximal multimodal cueing      Exercises      General Comments        Pertinent Vitals/Pain Pain Assessment: No/denies pain    Home Living                      Prior Function            PT Goals (current goals can now be  found in the care plan section) Acute Rehab PT Goals Patient Stated Goal: per family, patient with trip to [place] planned Potential to Achieve Goals: Good Progress towards PT goals: Progressing toward goals    Frequency    Min 3X/week      PT Plan Current plan remains appropriate    Co-evaluation              AM-PAC PT "6 Clicks" Mobility   Outcome Measure  Help needed turning  from your back to your side while in a flat bed without using bedrails?: A Little Help needed moving from lying on your back to sitting on the side of a flat bed without using bedrails?: A Little Help needed moving to and from a bed to a chair (including a wheelchair)?: A Little Help needed standing up from a chair using your arms (e.g., wheelchair or bedside chair)?: A Little Help needed to walk in hospital room?: A Little Help needed climbing 3-5 steps with a railing? : A Little 6 Click Score: 18    End of Session Equipment Utilized During Treatment: Gait belt Activity Tolerance: Patient tolerated treatment well Patient left: in chair;with call bell/phone within reach;with chair alarm set;with family/visitor present Nurse Communication: Mobility status PT Visit Diagnosis: Difficulty in walking, not elsewhere classified (R26.2);Other symptoms and signs involving the nervous system (R29.898)     Time: 9201-0071 PT Time Calculation (min) (ACUTE ONLY): 30 min  Charges:  $Gait Training: 8-22 mins $Therapeutic Activity: 8-22 mins                     Governor Rooks, PTA Acute Rehabilitation Services Pager [phone] Office [phone]     Courtney Willis 01/17/2018, 11:38 AM

## 2018-01-17 NOTE — PMR Pre-admission (Addendum)
PMR Admission Coordinator Pre-Admission Assessment  Patient: Courtney Willis is an 83 y.o., female MRN: [id number] DOB: 1934-03-21 Height: '5\' 9"'  (175.3 cm) Weight: 57 kg              Insurance Information HMO:    PPO: Yes     PCP:       IPA:       80/20:       OTHER:  Group #[id number] PRIMARY: [employer] Medicare      Policy#: [id number]      Subscriber: patient CM Name: Lenna Sciara      Phone#: [phone]     Fax#: [phone] Pre-Cert#: [id number] for 7 days with followup to Dorthula Nettles at fax [phone] on 01/24/18      Employer: Retired Benefits:  Phone #: [phone]     Name:  Online portal Eff. Date: 03/04/17     Deduct: $0      Out of Pocket Max:  $4000 (met $593.85)      Life Max: N/A CIR: $160 days 1-10      SNF: $0 days 1-20; $50 days 21-100 Outpatient: medical necessity     Co-Pay: $20/visit Home Health: 100%      Co-Pay: none DME: 80%     Co-Pay: 20% Providers: in network  Medicaid Application Date:        Case Manager:   Disability Application Date:        Case Worker:    Emergency Facilities manager Information    Name Relation Home Work Mobile   Sagun,Matt Son [phone]  [phone]   Blenda Mounts Daughter   [phone]   Annamary Rummage Granddaughter [phone]  [phone]     Current Medical History  Patient Admitting Diagnosis:Left frontal lobe hemorrhage/SAH    History of Present Illness: An 82 year old right-handed female with history of hyperlipidemia and tobacco abuse. History taken from chart review and daughter. Presented 01/13/2018 with aphasia. Per report patient lives alone independent living facility/[facility] prior to admission and very active. One level home with level entry. Patient was found by friend wandering about the house half dressed. CT of the head reviewed, showing left frontal lobe hemorrhage.Per report, acute intracranial hemorrhage in the anterior superior frontal gyrus with extension into the left subarachnoid space. Surrounding  edema and regional mass effect with a 4-5 millimeter rightward midline shift. Patient did not receive TPA secondary to hemorrhage. MRI the brain showed a large left frontal lobe intraparenchymal hemorrhage and unchanged 4 mm rightward midline shift. CT angiogram of head and neck with no significant stenosis or large vessel occlusion. Latest follow-up cranial CT scan 01/15/2018 stable. Echocardiogram with ejection fraction of 74% grade 1 diastolic dysfunction. Patient is tolerating a regular diet. Therapy evaluations completed with recommendations of physical medicine rehabilitation consult. Patient to be admitted for a comprehensive inpatient rehabilitation program.  Complete NIHSS TOTAL: 6  Past Medical History  Past Medical History:  Diagnosis Date  . Acid reflux 02/26/2008  . Arthritis   . 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 Avitaminosis D 08/12/2014  . Bowel disease 02/16/2008  . Bunion 08/12/2014  . Cancer [facility])    Skin cancer- basal- upper arm , upper chest  . CN (constipation) 08/12/2014  . DD (diverticular disease) 02/16/2008  . Elevated liver enzymes 08/12/2014  . Fibroids, submucosal 08/12/2014   of her lower lip   . Hypercholesteremia 08/12/2014  . 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 Post menopausal syndrome 08/12/2014    Family History  family history includes AVM in her  daughter; Breast cancer in her paternal grandmother; Hodgkin's lymphoma in her sister; Lung cancer in her father; Rheum arthritis in her mother; Ulcers in her father.  Prior Rehab/Hospitalizations: No previous rehab admission.   Has the patient had major surgery during 100 days prior to admission? No  Current Medications   Current Facility-Administered Medications:  .  acetaminophen (TYLENOL) tablet 650 mg, 650 mg, Oral, Q4H PRN, 650 mg at 01/15/18 1125 **OR** acetaminophen (TYLENOL) solution 650 mg, 650 mg, Per Tube, Q4H PRN **OR** acetaminophen (TYLENOL) suppository 650 mg, 650 mg, Rectal, Q4H PRN, Biby, Sharon L, NP .   chlorhexidine (PERIDEX) 0.12 % solution 15 mL, 15 mL, Mouth Rinse, BID, Biby, Sharon L, NP, 15 mL at 01/17/18 2119 .  labetalol (NORMODYNE,TRANDATE) injection 10 mg, 10 mg, Intravenous, Q2H PRN, Biby, Sharon L, NP .  MEDLINE mouth rinse, 15 mL, Mouth Rinse, q12n4p, Biby, Sharon L, NP, 15 mL at 01/17/18 1756 .  pantoprazole (PROTONIX) EC tablet 40 mg, 40 mg, Oral, QHS, Biby, Sharon L, NP, 40 mg at 01/17/18 2117 .  polyvinyl alcohol (LIQUIFILM TEARS) 1.4 % ophthalmic solution 1 drop, 1 drop, Both Eyes, PRN, Biby, Sharon L, NP .  senna-docusate (Senokot-S) tablet 1 tablet, 1 tablet, Oral, BID, Donzetta Starch, NP, 1 tablet at 01/17/18 2117 .  timolol (TIMOPTIC) 0.5 % ophthalmic solution 1 drop, 1 drop, Both Eyes, QHS, Biby, Sharon L, NP, 1 drop at 01/17/18 2118  Patients Current Diet:  Diet Order            Diet regular Room service appropriate? Yes; Fluid consistency: Thin  Diet effective now              Precautions / Restrictions Precautions Precautions: Fall Restrictions Weight Bearing Restrictions: No   Has the patient had 2 or more falls or a fall with injury in the past year?No  Prior Activity Level Community (5-7x/wk): Went out daily, was driving.  Recently went on a trip to [place]. Goes to yoga class.  Home Assistive Devices / Equipment Home Assistive Devices/Equipment: None Home Equipment: Environmental consultant - 2 wheels, [place] - single point, Bedside commode, Shower seat, Grab bars - tub/shower  Prior Device Use: Indicate devices/aids used by the patient prior to current illness, exacerbation or injury? None  Prior Functional Level Prior Function Level of Independence: Independent Comments: patient just returned from trip to [place]: Did the patient need help bathing, dressing, using the toilet or eating?  Independent  Indoor Mobility: Did the patient need assistance with walking from room to room (with or without device)? Independent  Stairs: Did the patient need  assistance with internal or external stairs (with or without device)? Independent  Functional Cognition: Did the patient need help planning regular tasks such as shopping or remembering to take medications? Independent  Current Functional Level Cognition  Arousal/Alertness: Awake/alert Overall Cognitive Status: Difficult to assess Difficult to assess due to: Impaired communication Current Attention Level: Sustained Orientation Level: Oriented to person Following Commands: Follows one step commands with increased time, Follows multi-step commands inconsistently General Comments: pt able to follow simple 1 step commands with increased time, but demosntrates difficulty sequencing and problem sovling through automatic tasks with maximal multimodal cueing Attention: Sustained Sustained Attention: Impaired Sustained Attention Impairment: Verbal basic, Functional basic Memory: (TBA) Awareness: Impaired Awareness Impairment: Emergent impairment Problem Solving: Impaired Problem Solving Impairment: Functional basic Safety/Judgment: Impaired    Extremity Assessment (includes Sensation/Coordination)  Upper Extremity Assessment: RUE deficits/detail RUE Deficits / Details: strength 3+/5,  bradykiensic movements, imapired functional coordination and decreased attention to UE  RUE Sensation: decreased light touch, decreased proprioception(difficult to assess) RUE Coordination: decreased fine motor, decreased gross motor  Lower Extremity Assessment: Defer to PT evaluation RLE Deficits / Details: modest assymetry in RLE strength upon testing but overall functional bilaterally RLE Coordination: decreased gross motor(due to delayed initiation )    ADLs  Overall ADL's : Needs assistance/impaired Eating/Feeding: Minimal assistance Eating/Feeding Details (indicate cue type and reason): taking a sip of water  Grooming: Oral care, Moderate assistance, Brushing hair, Standing Grooming Details (indicate  cue type and reason): maximal multimodal cueing to sequence and problem solve, min assist in standing  Upper Body Bathing: Moderate assistance, Sitting Lower Body Bathing: Maximal assistance, Sit to/from stand Upper Body Dressing : Moderate assistance, Sitting Lower Body Dressing: Maximal assistance, Sit to/from stand Toilet Transfer: Minimal assistance, Ambulation Toilet Transfer Details (indicate cue type and reason): simulated in room Functional mobility during ADLs: Minimal assistance, Cueing for sequencing, Cueing for safety General ADL Comments: patient requires increased assist for self care tasks due to decreased sequencing, problem sovling, intation, and decreased functional use of R UE     Mobility  Overal bed mobility: Needs Assistance Bed Mobility: Supine to Sit Rolling: Min assist Supine to sit: Min guard General bed mobility comments: min guard for safety and initation of mobility, multimodal cueing     Transfers  Overall transfer level: Needs assistance Equipment used: None Transfers: Sit to/from Stand Sit to Stand: Min guard General transfer comment: min guard for safety, postural sway noted in standing and reaching for railing on bed observed.      Ambulation / Gait / Stairs / Wheelchair Mobility  Ambulation/Gait Ambulation/Gait assistance: Herbalist (Feet): 70 Feet Assistive device: None Gait Pattern/deviations: Step-through pattern, Decreased stride length, Drifts right/left, Staggering right, Staggering left General Gait Details: patient with poor environmental awareness specific to the right side. Increased assist for stability. LOB noted with vertical and horizontal head turns.   Gait velocity: decreased Gait velocity interpretation: <1.8 ft/sec, indicate of risk for recurrent falls    Posture / Balance Balance Overall balance assessment: Needs assistance Sitting-balance support: No upper extremity supported, Feet supported Sitting  balance-Leahy Scale: Fair Standing balance support: During functional activity, No upper extremity supported Standing balance-Leahy Scale: Poor Standing balance comment: hands on assist for stability High level balance activites: Backward walking, Direction changes, Turns, Sudden stops, Head turns High Level Balance Comments: min to moderate assist for LOB x2 during higher level task performance    Special needs/care consideration BiPAP/CPAP No CPM No Continuous Drip IV No Dialysis No      Life Vest No Oxygen No Special Bed No Trach Size No Wound Vac (area) No    Skin No                             Bowel mgmt: Last BM 01/15/18 Bladder mgmt: Voiding WDL Diabetic mgmt No    Previous Home Environment Living Arrangements: Alone Available Help at Discharge: Family, Friend(s) Type of Home: Independent living facility Home Layout: One level Home Access: Level entry Bathroom Shower/Tub: Multimedia programmer: Standard Home Care Services: No  Discharge Living Setting Plans for Discharge Living Setting: Alone, Other (Comment)(Lives alone townhouse at [facility] Type of Home at Discharge: Other (Comment)(Townhouse at [place] Discharge Home Layout: One level Discharge Home Access: Level entry Discharge Bathroom Shower/Tub: Walk-in shower, Curtain  Discharge Bathroom Toilet: Handicapped height Discharge Bathroom Accessibility: Yes How Accessible: Accessible via wheelchair, Accessible via walker Does the patient have any problems obtaining your medications?: No  Social/Family/Support Systems Patient Roles: Parent, Other (Comment)(Has 2 children and a granddaughter.) Contact Information: Blenda Mounts - daughter Anticipated Caregiver: Daughter and grand daughter Anticipated Caregiver's Contact Information: [name] - daughter - [phone] Ability/Limitations of Caregiver: Daughter and grand daughter will share caregiver duties. Has friends who can assist as  well after discharge. Caregiver Availability: Other (Comment)(Dtr aware of need for 24/7 supervision after discharge.) Discharge Plan Discussed with Primary Caregiver: Yes Is Caregiver In Agreement with Plan?: Yes Does Caregiver/Family have Issues with Lodging/Transportation while Pt is in Rehab?: No  Goals/Additional Needs Patient/Family Goal for Rehab: PT/OT supervision, SLP min assist goals Expected length of stay: 12-15 days Cultural Considerations: Attends [employer]. Dietary Needs: Regular diet, thin liquids Equipment Needs: TBD Pt/Family Agrees to Admission and willing to participate: Yes(Talked with patient and daughter, Blenda Mounts.) Program Orientation Provided & Reviewed with Pt/Caregiver Including Roles  & Responsibilities: Yes  Decrease burden of Care through IP rehab admission: N/A  Possible need for SNF placement upon discharge: Potentially  Patient Condition: This patient's medical and functional status has changed since the consult dated: 01/16/18 in which the Rehabilitation Physician determined and documented that the patient's condition is appropriate for intensive rehabilitative care in an inpatient rehabilitation facility. See "History of Present Illness" (above) for medical update. Functional changes are: Currently requiring min assist to ambulate 70 feet no device. Patient's medical and functional status update has been discussed with the Rehabilitation physician and patient remains appropriate for inpatient rehabilitation. Will admit to inpatient rehab today.  Preadmission Screen Completed By:  Retta Diones, 01/18/2018 9:58 AM ______________________________________________________________________   Discussed status with Dr. Posey Pronto on 01/18/18 at 71 and received telephone approval for admission today.  Admission Coordinator:  Retta Diones, time 1001/Date 01/18/18

## 2018-01-18 ENCOUNTER — Encounter (HOSPITAL_COMMUNITY): Payer: Self-pay

## 2018-01-18 ENCOUNTER — Other Ambulatory Visit: Payer: Self-pay

## 2018-01-18 ENCOUNTER — Inpatient Hospital Stay (HOSPITAL_COMMUNITY)
Admission: RE | Admit: 2018-01-18 | Discharge: 2018-01-27 | DRG: 057 | Disposition: A | Discharge status: Skilled Nursing Facility | Payer: Medicare Other | Source: Intra-hospital | Attending: Physical Medicine & Rehabilitation | Admitting: Physical Medicine & Rehabilitation

## 2018-01-18 DIAGNOSIS — Z79899 Other long term (current) drug therapy: Secondary | ICD-10-CM

## 2018-01-18 DIAGNOSIS — Z801 Family history of malignant neoplasm of trachea, bronchus and lung: Secondary | ICD-10-CM

## 2018-01-18 DIAGNOSIS — Z85828 Personal history of other malignant neoplasm of skin: Secondary | ICD-10-CM

## 2018-01-18 DIAGNOSIS — I680 Cerebral amyloid angiopathy: Secondary | ICD-10-CM | POA: Diagnosis present

## 2018-01-18 DIAGNOSIS — R0989 Other specified symptoms and signs involving the circulatory and respiratory systems: Secondary | ICD-10-CM

## 2018-01-18 DIAGNOSIS — I69212 Visuospatial deficit and spatial neglect following other nontraumatic intracranial hemorrhage: Secondary | ICD-10-CM

## 2018-01-18 DIAGNOSIS — E854 Organ-limited amyloidosis: Secondary | ICD-10-CM | POA: Diagnosis present

## 2018-01-18 DIAGNOSIS — E871 Hypo-osmolality and hyponatremia: Secondary | ICD-10-CM

## 2018-01-18 DIAGNOSIS — Z8261 Family history of arthritis: Secondary | ICD-10-CM | POA: Diagnosis not present

## 2018-01-18 DIAGNOSIS — Z807 Family history of other malignant neoplasms of lymphoid, hematopoietic and related tissues: Secondary | ICD-10-CM | POA: Diagnosis not present

## 2018-01-18 DIAGNOSIS — I69290 Apraxia following other nontraumatic intracranial hemorrhage: Secondary | ICD-10-CM

## 2018-01-18 DIAGNOSIS — K219 Gastro-esophageal reflux disease without esophagitis: Secondary | ICD-10-CM | POA: Diagnosis present

## 2018-01-18 DIAGNOSIS — I619 Nontraumatic intracerebral hemorrhage, unspecified: Secondary | ICD-10-CM

## 2018-01-18 DIAGNOSIS — Z803 Family history of malignant neoplasm of breast: Secondary | ICD-10-CM | POA: Diagnosis not present

## 2018-01-18 DIAGNOSIS — M1990 Unspecified osteoarthritis, unspecified site: Secondary | ICD-10-CM | POA: Diagnosis present

## 2018-01-18 DIAGNOSIS — I609 Nontraumatic subarachnoid hemorrhage, unspecified: Secondary | ICD-10-CM

## 2018-01-18 DIAGNOSIS — Z87891 Personal history of nicotine dependence: Secondary | ICD-10-CM

## 2018-01-18 DIAGNOSIS — I69220 Aphasia following other nontraumatic intracranial hemorrhage: Secondary | ICD-10-CM | POA: Diagnosis not present

## 2018-01-18 DIAGNOSIS — I69020 Aphasia following nontraumatic subarachnoid hemorrhage: Principal | ICD-10-CM

## 2018-01-18 DIAGNOSIS — E785 Hyperlipidemia, unspecified: Secondary | ICD-10-CM | POA: Diagnosis present

## 2018-01-18 DIAGNOSIS — R4701 Aphasia: Secondary | ICD-10-CM

## 2018-01-18 DIAGNOSIS — R481 Agnosia: Secondary | ICD-10-CM | POA: Diagnosis not present

## 2018-01-18 DIAGNOSIS — I69390 Apraxia following cerebral infarction: Secondary | ICD-10-CM | POA: Diagnosis not present

## 2018-01-18 DIAGNOSIS — E7800 Pure hypercholesterolemia, unspecified: Secondary | ICD-10-CM | POA: Diagnosis present

## 2018-01-18 LAB — BASIC METABOLIC PANEL
ANION GAP: 9 (ref 5–15)
BUN: 13 mg/dL (ref 8–23)
CALCIUM: 8.6 mg/dL — AB (ref 8.9–10.3)
CO2: 24 mmol/L (ref 22–32)
Chloride: 101 mmol/L (ref 98–111)
Creatinine, Ser: 0.62 mg/dL (ref 0.44–1.00)
GFR calc Af Amer: >60 mL/min (ref >60)
GFR calc non Af Amer: >60 mL/min (ref >60)
Glucose, Bld: 106 mg/dL — ABNORMAL HIGH (ref 70–99)
Potassium: 3.5 mmol/L (ref 3.5–5.1)
Sodium: 134 mmol/L — ABNORMAL LOW (ref 135–145)

## 2018-01-18 LAB — CBC
HEMATOCRIT: 35.1 % — AB (ref 36.0–46.0)
Hemoglobin: 11.2 g/dL — ABNORMAL LOW (ref 12.0–15.0)
MCH: 29.9 pg (ref 26.0–34.0)
MCHC: 31.9 g/dL (ref 30.0–36.0)
MCV: 93.9 fL (ref 80.0–100.0)
Platelets: 241 10*3/uL (ref 150–400)
RBC: 3.74 MIL/uL — ABNORMAL LOW (ref 3.87–5.11)
RDW: 12.3 % (ref 11.5–15.5)
WBC: 6.2 10*3/uL (ref 4.0–10.5)
nRBC: 0 % (ref 0.0–0.2)

## 2018-01-18 MED ORDER — SORBITOL 70 % SOLN
30.0000 mL | Freq: Every day | Status: DC | PRN
  Administered 2018-01-22: 30 mL via ORAL
  Filled 2018-01-18: qty 30

## 2018-01-18 MED ORDER — PRAVASTATIN SODIUM 10 MG PO TABS
20.0000 mg | ORAL_TABLET | Freq: Every day | ORAL | Status: DC
  Administered 2018-01-18 – 2018-01-26 (×9): 20 mg via ORAL
  Filled 2018-01-18 (×10): qty 2

## 2018-01-18 MED ORDER — PRAVASTATIN SODIUM 10 MG PO TABS: 20.0000 mg | ORAL_TABLET | Freq: Every day | ORAL | Status: DC

## 2018-01-18 MED ORDER — POLYVINYL ALCOHOL 1.4 % OP SOLN: 1.0000 [drp] | OPHTHALMIC | Status: DC | PRN

## 2018-01-18 MED ORDER — ACETAMINOPHEN 160 MG/5ML PO SOLN: 650.0000 mg | ORAL | Status: DC | PRN

## 2018-01-18 MED ORDER — ACETAMINOPHEN 650 MG RE SUPP: 650.0000 mg | RECTAL | Status: DC | PRN

## 2018-01-18 MED ORDER — SENNOSIDES-DOCUSATE SODIUM 8.6-50 MG PO TABS
1.0000 | ORAL_TABLET | Freq: Two times a day (BID) | ORAL | Status: DC
  Administered 2018-01-18 – 2018-01-27 (×18): 1 via ORAL
  Filled 2018-01-18 (×17): qty 1

## 2018-01-18 MED ORDER — PRAVASTATIN SODIUM 20 MG PO TABS: 20.0000 mg | ORAL_TABLET | Freq: Every day | ORAL | Status: DC

## 2018-01-18 MED ORDER — FLUTICASONE PROPIONATE 50 MCG/ACT NA SUSP: 2.0000 | Freq: Every day | NASAL | Status: DC | PRN

## 2018-01-18 MED ORDER — TIMOLOL MALEATE 0.5 % OP SOLN
1.0000 [drp] | Freq: Every day | OPHTHALMIC | Status: DC
  Administered 2018-01-18 – 2018-01-26 (×9): 1 [drp] via OPHTHALMIC
  Filled 2018-01-18: qty 5

## 2018-01-18 MED ORDER — ACETAMINOPHEN 325 MG PO TABS: 650.0000 mg | ORAL_TABLET | ORAL | Status: DC | PRN

## 2018-01-18 MED ORDER — PANTOPRAZOLE SODIUM 40 MG PO TBEC
40.0000 mg | DELAYED_RELEASE_TABLET | Freq: Every day | ORAL | Status: DC
  Administered 2018-01-18 – 2018-01-26 (×9): 40 mg via ORAL
  Filled 2018-01-18 (×9): qty 1

## 2018-01-18 MED ORDER — SENNOSIDES-DOCUSATE SODIUM 8.6-50 MG PO TABS: 1.0000 | ORAL_TABLET | Freq: Two times a day (BID) | ORAL | Status: DC

## 2018-01-18 NOTE — H&P (Signed)
Physical Medicine and Rehabilitation Admission H&P    Chief Complaint  Patient presents with  . Code Stroke  : HPI: Courtney Willis is an 83 year old right-handed female with history of hyperlipidemia and tobacco abuse. History taken from chart review and family. Presented 01/13/2018 with aphasia. Per report patient lives alone independent living facility/[facility] prior to admission and very active. One level home with level entry. Patient was found by friend wandering about the house half dressed. CT of the head reviewed, showing left frontal lobe hemorrhage.Per report, acute intracranial hemorrhage in the anterior superior frontal gyrus with extension into the left subarachnoid space. Surrounding edema and regional mass effect with a 4-5 millimeter rightward midline shift. Patient did not receive TPA secondary to hemorrhage. MRI the brain showed a large left frontal lobe intraparenchymal hemorrhage and unchanged 4 mm rightward midline shift. CT angiogram of head and neck with no significant stenosis or large vessel occlusion. Latest follow-up cranial CT scan 01/15/2018 stable. Echocardiogram with ejection fraction of 18% grade 1 diastolic dysfunction. Patient is tolerating a regular diet. Therapy evaluations completed with recommendations of physical medicine rehabilitation consult. Patient was admitted for a comprehensive rehabilitation program.  Review of Systems  Unable to perform ROS: Language   Past Medical History:  Diagnosis Date  . Acid reflux 02/26/2008  . Arthritis   . 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 Avitaminosis D 08/12/2014  . Bowel disease 02/16/2008  . Bunion 08/12/2014  . Cancer [facility])    Skin cancer- basal- upper arm , upper chest  . CN (constipation) 08/12/2014  . DD (diverticular disease) 02/16/2008  . Elevated liver enzymes 08/12/2014  . Fibroids, submucosal 08/12/2014   of her lower lip   . Hypercholesteremia 08/12/2014  . 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 Post menopausal syndrome  08/12/2014   Past Surgical History:  Procedure Laterality Date  . BUNIONECTOMY     05/2004, 2007  . CATARACT EXTRACTION     04/2005, 06/2005  . COLONOSCOPY W/ POLYPECTOMY    . COLONOSCOPY WITH PROPOFOL N/A 02/15/2017   Procedure: COLONOSCOPY WITH PROPOFOL;  Surgeon: Jonathon Bellows, MD;  Location: [facility] ENDOSCOPY;  Service: Gastroenterology;  Laterality: N/A;  . HYSTEROSCOPY  2011  . ORIF WRIST FRACTURE Right 08/30/2015   Procedure: OPEN REDUCTION INTERNAL FIXATION (ORIF) RIGHT WRIST FRACTURE AND REPAIR AS NECESSARY;  Surgeon: Roseanne Kaufman, MD;  Location: [place];  Service: Orthopedics;  Laterality: Right;  . UPPER GI ENDOSCOPY    . WRIST FRACTURE SURGERY Left    06/2006   Family History  Problem Relation Age of Onset  . Rheum arthritis Mother   . Lung cancer Father   . Ulcers Father   . Hodgkin's lymphoma Sister   . AVM Daughter   . Breast cancer Paternal Grandmother    Social History:  reports that she has quit smoking. She has never used smokeless tobacco. She reports current alcohol use of about 3.0 - 4.0 standard drinks of alcohol per week. She reports that she does not use drugs. Allergies: No Known Allergies Medications Prior to Admission  Medication Sig Dispense Refill  . ascorbic acid (VITAMIN C) 500 MG tablet Take 1 tablet by mouth daily.    . Calcium Carbonate (CALCIUM 600 PO) Take 1,200 mg by mouth daily.    . chlorpheniramine (ALLERGY) 4 MG tablet Take 4 mg by mouth 2 (two) times daily as needed for allergies.    . Cholecalciferol (VITAMIN D3) 1000 UNITS CAPS Take 1 capsule by mouth daily.    Marland Kitchen  fluticasone (FLONASE) 50 MCG/ACT nasal spray Place 2 sprays into both nostrils daily. (Patient taking differently: Place 2 sprays into both nostrils daily as needed for allergies. ) 16 g 6  . polyethylene glycol (MIRALAX / GLYCOLAX) packet Take 17 g by mouth daily as needed for mild constipation.    . polyvinyl alcohol-povidone (REFRESH) 1.4-0.6 % ophthalmic solution Apply 1 drop to eye  daily as needed (dry eyes).     . Propylene Glycol (SYSTANE BALANCE) 0.6 % SOLN Apply 1 drop to eye as needed.    . timolol (TIMOPTIC) 0.5 % ophthalmic solution Place 1 drop into both eyes at bedtime.  5    Drug Regimen Review Drug regimen was reviewed and remains appropriate with no significant issues identified  Home: Home Living Family/patient expects to be discharged to:: Private residence Living Arrangements: Alone Available Help at Discharge: Family, Friend(s) Type of Home: Independent living facility Home Access: Level entry Home Layout: One level Bathroom Shower/Tub: Multimedia programmer: Standard Home Equipment: Environmental consultant - 2 wheels, [employer] - single point, Bedside commode, Shower seat, Grab bars - tub/shower   Functional History: Prior Function Level of Independence: Independent Comments: patient just returned from trip to [place] Status:  Mobility: Bed Mobility Overal bed mobility: Needs Assistance Bed Mobility: Supine to Sit Rolling: Min assist Supine to sit: Min guard General bed mobility comments: min guard for safety and initation of mobility, multimodal cueing  Transfers Overall transfer level: Needs assistance Equipment used: None Transfers: Sit to/from Stand Sit to Stand: Min guard General transfer comment: min guard for safety, postural sway noted in standing and reaching for railing on bed observed.   Ambulation/Gait Ambulation/Gait assistance: Min assist Gait Distance (Feet): 70 Feet Assistive device: None Gait Pattern/deviations: Step-through pattern, Decreased stride length, Drifts right/left, Staggering right, Staggering left General Gait Details: patient with poor environmental awareness specific to the right side. Increased assist for stability. LOB noted with vertical and horizontal head turns.   Gait velocity: decreased Gait velocity interpretation: <1.8 ft/sec, indicate of risk for recurrent falls    ADL: ADL Overall  ADL's : Needs assistance/impaired Eating/Feeding: Minimal assistance Eating/Feeding Details (indicate cue type and reason): taking a sip of water  Grooming: Oral care, Moderate assistance, Brushing hair, Standing Grooming Details (indicate cue type and reason): maximal multimodal cueing to sequence and problem solve, min assist in standing  Upper Body Bathing: Moderate assistance, Sitting Lower Body Bathing: Maximal assistance, Sit to/from stand Upper Body Dressing : Moderate assistance, Sitting Lower Body Dressing: Maximal assistance, Sit to/from stand Toilet Transfer: Minimal assistance, Ambulation Toilet Transfer Details (indicate cue type and reason): simulated in room Functional mobility during ADLs: Minimal assistance, Cueing for sequencing, Cueing for safety General ADL Comments: patient requires increased assist for self care tasks due to decreased sequencing, problem sovling, intation, and decreased functional use of R UE   Cognition: Cognition Overall Cognitive Status: Difficult to assess Arousal/Alertness: Awake/alert Orientation Level: Oriented to person Attention: Sustained Sustained Attention: Impaired Sustained Attention Impairment: Verbal basic, Functional basic Memory: (TBA) Awareness: Impaired Awareness Impairment: Emergent impairment Problem Solving: Impaired Problem Solving Impairment: Functional basic Safety/Judgment: Impaired Cognition Arousal/Alertness: Awake/alert Behavior During Therapy: Flat affect Overall Cognitive Status: Difficult to assess Area of Impairment: Orientation, Memory, Attention, Following commands, Safety/judgement, Awareness, Problem solving Orientation Level: Disoriented to, Place, Situation Current Attention Level: Sustained Memory: Decreased recall of precautions, Decreased short-term memory Following Commands: Follows one step commands with increased time, Follows multi-step commands inconsistently Awareness: Intellectual Problem  Solving: Slow  processing, Decreased initiation, Difficulty sequencing, Requires verbal cues, Requires tactile cues General Comments: pt able to follow simple 1 step commands with increased time, but demosntrates difficulty sequencing and problem sovling through automatic tasks with maximal multimodal cueing Difficult to assess due to: Impaired communication  Physical Exam: Blood pressure (!) 126/53, pulse 74, temperature 99.4 F (37.4 C), temperature source Oral, resp. rate 15, height 5\' 9"  (1.753 m), weight 57 kg, SpO2 97 %. Physical Exam  Vitals reviewed. Constitutional: She appears well-developed.  Frail  HENT:  Head: Normocephalic and atraumatic.  Eyes: EOM are normal. Right eye exhibits no discharge. Left eye exhibits no discharge.  Neck: Normal range of motion. Neck supple. No thyromegaly present.  Cardiovascular: Normal rate and regular rhythm.  Respiratory: Effort normal and breath sounds normal. No respiratory distress.  GI: Soft. Bowel sounds are normal. She exhibits no distension.  Musculoskeletal:        General: No tenderness or edema.  Neurological:  Patient is alert sitting up in bed.  She is aphasic with some spontaneous speech.  Motor: Limited by ability to follow commands, however improving.  Left upper extremity/left lower extremity 4+-5/5 proximal distal Right upper extremity/right lower extremity:: 4/5 proximal distal    Results for orders placed or performed during the hospital encounter of 01/13/18 (from the past 48 hour(s))  CBC     Status: Abnormal   Collection Time: 01/17/18  4:32 AM  Result Value Ref Range   WBC 5.9 4.0 - 10.5 K/uL   RBC 3.71 (L) 3.87 - 5.11 MIL/uL   Hemoglobin 11.1 (L) 12.0 - 15.0 g/dL   HCT 34.7 (L) 36.0 - 46.0 %   MCV 93.5 80.0 - 100.0 fL   MCH 29.9 26.0 - 34.0 pg   MCHC 32.0 30.0 - 36.0 g/dL   RDW 12.3 11.5 - 15.5 %   Platelets 223 150 - 400 K/uL   nRBC 0.0 0.0 - 0.2 %    Comment: Performed at [facility], [place]  [address], [place], [place] [address]  Basic metabolic panel     Status: Abnormal   Collection Time: 01/17/18  4:32 AM  Result Value Ref Range   Sodium 136 135 - 145 mmol/L   Potassium 3.8 3.5 - 5.1 mmol/L   Chloride 102 98 - 111 mmol/L   CO2 25 22 - 32 mmol/L   Glucose, Bld 111 (H) 70 - 99 mg/dL   BUN 12 8 - 23 mg/dL   Creatinine, Ser 0.80 0.44 - 1.00 mg/dL   Calcium 8.7 (L) 8.9 - 10.3 mg/dL   GFR calc non Af Amer >60 >60 mL/min   GFR calc Af Amer >60 >60 mL/min   Anion gap 9 5 - 15    Comment: Performed at [facility] [address] [place],  [address]  CBC     Status: Abnormal   Collection Time: 01/18/18  4:34 AM  Result Value Ref Range   WBC 6.2 4.0 - 10.5 K/uL   RBC 3.74 (L) 3.87 - 5.11 MIL/uL   Hemoglobin 11.2 (L) 12.0 - 15.0 g/dL   HCT 35.1 (L) 36.0 - 46.0 %   MCV 93.9 80.0 - 100.0 fL   MCH 29.9 26.0 - 34.0 pg   MCHC 31.9 30.0 - 36.0 g/dL   RDW 12.3 11.5 - 15.5 %   Platelets 241 150 - 400 K/uL   nRBC 0.0 0.0 - 0.2 %    Comment: Performed at [facility], [address] [place], [place] [address]  Basic metabolic panel     Status: Abnormal   Collection Time: 01/18/18  4:34 AM  Result Value Ref Range   Sodium 134 (L) 135 - 145 mmol/L   Potassium 3.5 3.5 - 5.1 mmol/L   Chloride 101 98 - 111 mmol/L   CO2 24 22 - 32 mmol/L   Glucose, Bld 106 (H) 70 - 99 mg/dL   BUN 13 8 - 23 mg/dL   Creatinine, Ser 0.62 0.44 - 1.00 mg/dL   Calcium 8.6 (L) 8.9 - 10.3 mg/dL   GFR calc non Af Amer >60 >60 mL/min   GFR calc Af Amer >60 >60 mL/min   Anion gap 9 5 - 15    Comment: Performed at [facility] [address], [place],  [address]   No results found.     Medical Problem List and Plan: 1.   Decreased functional mobility with aphasia secondary to  Left frontal lobe intraparenchymal hemorrhage/SAH likely due to CAA( cerebral amyloid angiopathy) 2.  DVT Prophylaxis/Anticoagulation: SCDs. 3. Pain Management:  Tylenol as needed 4. Mood:  Provide  emotional support 5. Neuropsych: This patient is capable of making decisions on her own behalf. 6. Skin/Wound Care:  Routine skin checks 7. Fluids/Electrolytes/Nutrition:  Routine in and out's with follow-up chemistries 8. Hyperlipidemia. Begin Pravachol 20 mg daily at discharge 14.  Hyponatremia: CMP ordered for tomorrow   Post Admission Physician Evaluation: 1. Preadmission assessment reviewed and changes made below. 2. Functional deficits secondary  to left frontal IPH/SAH. 3. Patient is admitted to receive collaborative, interdisciplinary care between the physiatrist, rehab nursing staff, and therapy team. 4. Patient's level of medical complexity and substantial therapy needs in context of that medical necessity cannot be provided at a lesser intensity of care such as a SNF. 5. Patient has experienced substantial functional loss from his/her baseline which was documented above under the "Functional History" and "Functional Status" headings.  Judging by the patient's diagnosis, physical exam, and functional history, the patient has potential for functional progress which will result in measurable gains while on inpatient rehab.  These gains will be of substantial and practical use upon discharge  in facilitating mobility and self-care at the household level. 55. Physiatrist will provide 24 hour management of medical needs as well as oversight of the therapy plan/treatment and provide guidance as appropriate regarding the interaction of the two. 7. 24 hour rehab nursing will assist with bladder management, bowel management, safety, skin/wound care, disease management, medication administration and patient education  and help integrate therapy concepts, techniques,education, etc. 8. PT will assess and treat for/with: Lower extremity strength, range of motion, stamina, balance, functional mobility, safety, adaptive techniques and equipment, coping skills, pain control, education. Goals are:  Supervision. 9. OT will assess and treat for/with: ADL's, functional mobility, safety, upper extremity strength, adaptive techniques and equipment, ego support, and community reintegration.   Goals are: Supervision. Therapy may proceed with showering this patient. 10. SLP will assess and treat for/with: Speech, language, cognition.  Goals are: Supervision/min a. 11. CaseShin Management and Social Worker will assess and treat for psychological issues and discharge planning. 12. Team conference will be held weekly to assess progress toward goals and to determine barriers to discharge. 13. Patient will receive at least 3 hours of therapy per day at least 5 days per week. 14. ELOS: 12-16 days.       15. Prognosis:  good  I have personally performed a face to  face diagnostic evaluation, including, but not limited to relevant history and physical exam findings, of this patient and developed relevant assessment and plan.  Additionally, I have reviewed and concur with the physician assistant's documentation above.  The patient's status has not changed. The original post admission physician evaluation remains appropriate, and any changes from the pre-admission screening or documentation from the acute chart are noted above.    Delice Lesch, MD, ABPMR Lavon Paganini Angiulli, PA-C 01/18/2018

## 2018-01-18 NOTE — Plan of Care (Signed)
Mod assist with adls 

## 2018-01-18 NOTE — Progress Notes (Signed)
Courtney Arn, Courtney Willis  Physician  Physical Medicine and Rehabilitation  Consult Note  Signed  Date of Service:  01/16/2018 6:22 AM       Related encounter: ED to Hosp-Admission (Current) from 01/13/2018 in [facility]      Signed      Expand All Collapse All    Show:Clear all [x] Manual[x] Template[] Copied  Added by: [x] Angiulli, Lavon Paganini, PA-C[x] Posey Pronto Domenick Bookbinder, Courtney Willis  [] Hover for details      Physical Medicine and Rehabilitation Consult Reason for Consult:  Decreased functional mobility Referring Physician:  Dr.Xu  HPI: Courtney Willis is a 83 y.o.right handed female with history of hyperlipidemia and tobacco use.  History taken from chart review and daughter.  Presented 01/13/2018 with aphasia. Per report patient lives alone independent living facility/ [place] prior to admission. One level home with level entry. Patient was found by a friend wandering about the house half dressed. CT the head reviewed, showing left frontal lobe hemorrhage.  Per report, acute intra-axial hemorrhage in the anterior superior frontal gyrus with extension into the left subarachnoid space. Surrounding edema and regional mass effect with a 4-5 millimeter rightward midline shift.Patient did not receive TPA secondary to hemorrhage.MRI of the brain showed large left frontal lobe intraparenchymal hemorrhage an unchanged 4 mm rightward midline shift. CT angiogram of head and neck with no significant stenosis or large vessel occlusion. Latest follow-up cranial CT scan 01/15/2018 stable. Echocardiogram with ejection fraction of 32% grade 1 diastolic dysfunction. Tolerating a regular diet. Therapy evaluations completed with recommendations of physical medicine rehabilitation consult.  Review of Systems  Unable to perform ROS: Language       Past Medical History:  Diagnosis Date  . Acid reflux 02/26/2008  . Arthritis   . 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 Avitaminosis D 08/12/2014    . Bowel disease 02/16/2008  . Bunion 08/12/2014  . Cancer [facility])    Skin cancer- basal- upper arm , upper chest  . CN (constipation) 08/12/2014  . DD (diverticular disease) 02/16/2008  . Elevated liver enzymes 08/12/2014  . Fibroids, submucosal 08/12/2014   of her lower lip   . Hypercholesteremia 08/12/2014  . 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 Post menopausal syndrome 08/12/2014        Past Surgical History:  Procedure Laterality Date  . BUNIONECTOMY     05/2004, 2007  . CATARACT EXTRACTION     04/2005, 06/2005  . COLONOSCOPY W/ POLYPECTOMY    . COLONOSCOPY WITH PROPOFOL N/A 02/15/2017   Procedure: COLONOSCOPY WITH PROPOFOL;  Surgeon: Jonathon Bellows, Courtney Willis;  Location: [facility] ENDOSCOPY;  Service: Gastroenterology;  Laterality: N/A;  . HYSTEROSCOPY  2011  . ORIF WRIST FRACTURE Right 08/30/2015   Procedure: OPEN REDUCTION INTERNAL FIXATION (ORIF) RIGHT WRIST FRACTURE AND REPAIR AS NECESSARY;  Surgeon: Roseanne Kaufman, Courtney Willis;  Location: [employer];  Service: Orthopedics;  Laterality: Right;  . UPPER GI ENDOSCOPY    . WRIST FRACTURE SURGERY Left    06/2006        Family History  Problem Relation Age of Onset  . Rheum arthritis Mother   . Lung cancer Father   . Ulcers Father   . Hodgkin's lymphoma Sister   . AVM Daughter   . Breast cancer Paternal Grandmother    Social History:  reports that she has quit smoking. She has never used smokeless tobacco. She reports current alcohol use of about 3.0 - 4.0 standard drinks of alcohol per week. She reports that she does not use  drugs. Allergies: No Known Allergies       Medications Prior to Admission  Medication Sig Dispense Refill  . ascorbic acid (VITAMIN C) 500 MG tablet Take 1 tablet by mouth daily.    . Calcium Carbonate (CALCIUM 600 PO) Take 1,200 mg by mouth daily.    . chlorpheniramine (ALLERGY) 4 MG tablet Take 4 mg by mouth 2 (two) times daily as needed for allergies.    . Cholecalciferol (VITAMIN D3) 1000 UNITS CAPS Take 1  capsule by mouth daily.    . fluticasone (FLONASE) 50 MCG/ACT nasal spray Place 2 sprays into both nostrils daily. (Patient taking differently: Place 2 sprays into both nostrils daily as needed for allergies. ) 16 g 6  . polyethylene glycol (MIRALAX / GLYCOLAX) packet Take 17 g by mouth daily as needed for mild constipation.    . polyvinyl alcohol-povidone (REFRESH) 1.4-0.6 % ophthalmic solution Apply 1 drop to eye daily as needed (dry eyes).     . Propylene Glycol (SYSTANE BALANCE) 0.6 % SOLN Apply 1 drop to eye as needed.    . timolol (TIMOPTIC) 0.5 % ophthalmic solution Place 1 drop into both eyes at bedtime.  5    Home: Home Living Family/patient expects to be discharged to:: Private residence Living Arrangements: Alone Available Help at Discharge: Family, Friend(s) Type of Home: Independent living facility Home Access: Level entry [place]: One level Bathroom Shower/Tub: Multimedia programmer: [place]: Environmental consultant - 2 wheels, [employer] - single point, Bedside commode, Shower seat, Grab bars - tub/shower  Functional History: Prior Function Level of Independence: Independent Comments: patient just returned from trip to [place] Status:  Mobility: Bed Mobility Overal bed mobility: Needs Assistance Bed Mobility: Supine to Sit Rolling: Min assist Supine to sit: Min guard General bed mobility comments: min guard for safety and initation of mobility, multimodal cueing  Transfers Overall transfer level: Needs assistance Equipment used: 1 person hand held assist Transfers: Sit to/from Stand Sit to Stand: Min assist General transfer comment: min assist for safety and balance  Ambulation/Gait Ambulation/Gait assistance: Min assist Gait Distance (Feet): 60 Feet(x2) Assistive device: 1 person hand held assist Gait Pattern/deviations: Step-through pattern, Decreased stride length, Drifts right/left General Gait Details: patient with poor  environmental awareness specific to the right side. Increased assist for stability.  Gait velocity: decreased Gait velocity interpretation: <1.8 ft/sec, indicate of risk for recurrent falls  ADL: ADL Overall ADL's : Needs assistance/impaired Eating/Feeding: Minimal assistance Eating/Feeding Details (indicate cue type and reason): taking a sip of water  Grooming: Oral care, Moderate assistance, Brushing hair, Standing Grooming Details (indicate cue type and reason): maximal multimodal cueing to sequence and problem solve, min assist in standing  Upper Body Bathing: Moderate assistance, Sitting Lower Body Bathing: Maximal assistance, Sit to/from stand Upper Body Dressing : Moderate assistance, Sitting Lower Body Dressing: Maximal assistance, Sit to/from stand Toilet Transfer: Minimal assistance, Ambulation Toilet Transfer Details (indicate cue type and reason): simulated in room Functional mobility during ADLs: Minimal assistance, Cueing for sequencing, Cueing for safety General ADL Comments: patient requires increased assist for self care tasks due to decreased sequencing, problem sovling, intation, and decreased functional use of R UE   Cognition: Cognition Overall Cognitive Status: Difficult to assess Arousal/Alertness: Awake/alert Orientation Level: Oriented to person, Oriented to place, Disoriented to time, Disoriented to situation, Other (comment) Attention: Sustained Sustained Attention: Impaired Sustained Attention Impairment: Verbal basic, Functional basic Memory: (TBA) Awareness: Impaired Awareness Impairment: Emergent impairment Problem Solving: Impaired Problem Solving Impairment:  Functional basic Safety/Judgment: Impaired Cognition Arousal/Alertness: Awake/alert Behavior During Therapy: Flat affect Overall Cognitive Status: Difficult to assess Area of Impairment: Orientation, Memory, Attention, Following commands, Safety/judgement, Awareness, Problem  solving Orientation Level: (unable to assess) Current Attention Level: Sustained Following Commands: Follows one step commands with increased time, Follows multi-step commands inconsistently Awareness: Intellectual(difficult to assess) Problem Solving: Slow processing, Decreased initiation, Difficulty sequencing, Requires verbal cues, Requires tactile cues General Comments: pt able to follow simple 1 step commands with increased time, but demosntrates difficulty sequencing and problem sovling through automatic tasks with maximal multimodal cueing Difficult to assess due to: Impaired communication  Blood pressure (!) 143/76, pulse 63, temperature 98.6 F (37 C), temperature source Oral, resp. rate 17, height 5\' 9"  (1.753 m), weight 57 kg, SpO2 98 %. Physical Exam  Vitals reviewed. Constitutional: She appears well-developed.  Frail  HENT:  Head: Normocephalic and atraumatic.  Eyes: EOM are normal. Right eye exhibits no discharge. Left eye exhibits no discharge.  Neck: Normal range of motion. Neck supple. No thyromegaly present.  Cardiovascular: Normal rate, regular rhythm and normal heart sounds.  Respiratory: Effort normal and breath sounds normal. No respiratory distress.  GI: Soft. Bowel sounds are normal. She exhibits no distension.  Musculoskeletal:     Comments: No edema or tenderness in extremities  Neurological: She is alert.  Patient is alert/aphasic with some spontaneous speech. Follow commands inconsistently,?  4+/5 throughout. Appears to have right upper extremity dysmetria  Skin: Skin is warm and dry.  Psychiatric:  Unable to assess due to cognition          Assessment/Plan: Diagnosis: Left frontal lobe hemorrhage/SAH Labs and images (see above) independently reviewed.  Records reviewed and summated above. Stroke: Continue secondary stroke prophylaxis and Risk Factor Modification listed below:   Blood Pressure Management:  Continue current medication with prn's  with permisive HTN per primary team Tobacco abuse:   ?Right hemiparesis:  Motor recovery: Fluoxetine   1. Does the need for close, 24 hr/day medical supervision in concert with the patient's rehab needs make it unreasonable for this patient to be served in a less intensive setting? Yes  2. Co-Morbidities requiring supervision/potential complications: diastolic dysfunction (monitor for signs and symptoms of fluid overload), hyperlipidemia, tobacco use (counsel), ABLA (repeat labs, transfuse to ensure appropriate perfusion for increased activity tolerance) 3. Due to safety, disease management, medication administration and patient education, does the patient require 24 hr/day rehab nursing? Yes 4. Does the patient require coordinated care of a physician, rehab nurse, PT (1-2 hrs/day, 5 days/week), OT (1-2 hrs/day, 5 days/week) and SLP (1-2 hrs/day, 5 days/week) to address physical and functional deficits in the context of the above medical diagnosis(es)? Yes Addressing deficits in the following areas: balance, endurance, locomotion, strength, transferring, bathing, dressing, toileting, cognition, speech, language and psychosocial support 5. Can the patient actively participate in an intensive therapy program of at least 3 hrs of therapy per day at least 5 days per week? Yes 6. The potential for patient to make measurable gains while on inpatient rehab is excellent 7. Anticipated functional outcomes upon discharge from inpatient rehab are supervision  with PT, supervision with OT, min assist with SLP. 8. Estimated rehab length of stay to reach the above functional goals is: 12-15 days. 9. Anticipated D/C setting: Home 10. Anticipated post D/C treatments: HH therapy and Home excercise program 11. Overall Rehab/Functional Prognosis: good  RECOMMENDATIONS: This patient's condition is appropriate for continued rehabilitative care in the following setting: Patient will likely require 24/7  supervision  at discharge, CIR if caregiver support available. Patient has agreed to participate in recommended program. Potentially Note that insurance prior authorization may be required for reimbursement for recommended care.  Comment: Rehab Admissions Coordinator to follow up.   I have personally performed a face to face diagnostic evaluation, including, but not limited to relevant history and physical exam findings, of this patient and developed relevant assessment and plan.  Additionally, I have reviewed and concur with the physician assistant's documentation above.   Delice Lesch, Courtney Willis, ABPMR Lavon Paganini Angiulli, PA-C 01/16/2018        Revision History                   Routing History

## 2018-01-18 NOTE — Progress Notes (Signed)
Pt admitted to [phone] with family at bedside. Pt alert and complains of no pain. Pt following commands at this time. Continue plan of care.

## 2018-01-18 NOTE — Progress Notes (Signed)
[name], MD  Physician  Physical Medicine and Rehabilitation  PMR Pre-admission  Addendum  Date of Service:  01/17/2018 12:51 PM       Related encounter: ED to Hosp-Admission (Current) from 01/13/2018 in [facility]         Show:Clear all _0 Manual_1 Template_2 Copied  Added by: _3 Courtney Diones, RN_4 [name], MD  _5 Hover for details PMR Admission Coordinator Pre-Admission Assessment  Patient: Courtney Willis is an 83 y.o., female MRN: [id number] DOB: 1934-11-30 Height: _6  (175.3 cm) Weight: 57 kg                                                                                                                                                  Insurance Information HMO:    PPO: Yes     PCP:       IPA:       80/20:       OTHER:  Group #[id number] PRIMARY: U[employer] Medicare      Policy#: [id number]      Subscriber: patient CM Name: Courtney Willis      Phone#: [phone]     Fax#: [phone] Pre-Cert#: [id number] for 7 days with followup to Dorthula Nettles at fax [phone] on 01/24/18      Employer: Retired Benefits:  Phone #: [phone]     Name:  Online portal Eff. Date: 03/04/17     Deduct: $0      Out of Pocket Max:  $4000 (met $593.85)      Life Max: N/A CIR: $160 days 1-10      SNF: $0 days 1-20; $50 days 21-100 Outpatient: medical necessity     Co-Pay: $20/visit Home Health: 100%      Co-Pay: none DME: 80%     Co-Pay: 20% Providers: in network  Medicaid Application Date:        Case Manager:   Disability Application Date:        Case Worker:    Emergency [employer] copy Information    Name Relation Home Work Mobile   Widjaja,Matt Son [phone]  [phone]   Blenda Mounts Daughter   [phone]   Annamary Rummage Granddaughter [phone]  [phone]     Current Medical History  Patient Admitting Diagnosis:Left frontal lobe hemorrhage/SAH    History of Present Illness: An 83 year old right-handed  female with history of hyperlipidemia and tobacco abuse. History taken from chart review and daughter. Presented 01/13/2018 with aphasia. Per report patient lives alone independent living facility/[facility] prior to admission and very active. One level home with level entry. Patient was found by friend wandering about the house half dressed. CT of the head reviewed, showing left frontal lobe hemorrhage.Per report, acute intracranial hemorrhage in the anterior superior frontal gyrus with extension into the left subarachnoid space. Surrounding edema  and regional mass effect with a 4-5 millimeter rightward midline shift. Patient did not receive TPA secondary to hemorrhage. MRI the brain showed a large left frontal lobe intraparenchymal hemorrhage and unchanged 4 mm rightward midline shift. CT angiogram of head and neck with no significant stenosis or large vessel occlusion. Latest follow-up cranial CT scan 01/15/2018 stable. Echocardiogram with ejection fraction of 58% grade 1 diastolic dysfunction. Patient is tolerating a regular diet. Therapy evaluations completed with recommendations of physical medicine rehabilitation consult. Patient to be admitted for a comprehensive inpatient rehabilitation program.  Complete NIHSS TOTAL: 6  Past Medical History      Past Medical History:  Diagnosis Date  . Acid reflux 02/26/2008  . Arthritis   . 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 Avitaminosis D 08/12/2014  . Bowel disease 02/16/2008  . Bunion 08/12/2014  . Cancer [facility])    Skin cancer- basal- upper arm , upper chest  . CN (constipation) 08/12/2014  . DD (diverticular disease) 02/16/2008  . Elevated liver enzymes 08/12/2014  . Fibroids, submucosal 08/12/2014   of her lower lip   . Hypercholesteremia 08/12/2014  . 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 Post menopausal syndrome 08/12/2014    Family History  family history includes AVM in her daughter; Breast cancer in her paternal grandmother; Hodgkin's lymphoma in her  sister; Lung cancer in her father; Rheum arthritis in her mother; Ulcers in her father.  Prior Rehab/Hospitalizations: No previous rehab admission.   Has the patient had major surgery during 100 days prior to admission? No  Current Medications   Current Facility-Administered Medications:  .  acetaminophen (TYLENOL) tablet 650 mg, 650 mg, Oral, Q4H PRN, 650 mg at 01/15/18 1125 **OR** acetaminophen (TYLENOL) solution 650 mg, 650 mg, Per Tube, Q4H PRN **OR** acetaminophen (TYLENOL) suppository 650 mg, 650 mg, Rectal, Q4H PRN, Courtney, Sharon L, NP .  chlorhexidine (PERIDEX) 0.12 % solution 15 mL, 15 mL, Mouth Rinse, BID, Courtney, Sharon L, NP, 15 mL at 01/17/18 2119 .  labetalol (NORMODYNE,TRANDATE) injection 10 mg, 10 mg, Intravenous, Q2H PRN, Courtney, Sharon L, NP .  MEDLINE mouth rinse, 15 mL, Mouth Rinse, q12n4p, Courtney, Sharon L, NP, 15 mL at 01/17/18 1756 .  pantoprazole (PROTONIX) EC tablet 40 mg, 40 mg, Oral, QHS, Courtney, Sharon L, NP, 40 mg at 01/17/18 2117 .  polyvinyl alcohol (LIQUIFILM TEARS) 1.4 % ophthalmic solution 1 drop, 1 drop, Both Eyes, PRN, Courtney, Sharon L, NP .  senna-docusate (Senokot-S) tablet 1 tablet, 1 tablet, Oral, BID, Courtney Starch, NP, 1 tablet at 01/17/18 2117 .  timolol (TIMOPTIC) 0.5 % ophthalmic solution 1 drop, 1 drop, Both Eyes, QHS, Courtney, Sharon L, NP, 1 drop at 01/17/18 2118  Patients Current Diet:     Diet Order                  Diet regular Room service appropriate? Yes; Fluid consistency: Thin  Diet effective now               Precautions / Restrictions Precautions Precautions: Fall Restrictions Weight Bearing Restrictions: No   Has the patient had 2 or more falls or a fall with injury in the past year?No  Prior Activity Level Community (5-7x/wk): Went out daily, was driving.  Recently went on a trip to [place]. Goes to yoga class.  Home Assistive Devices / Equipment Home Assistive Devices/Equipment: None Home Equipment: Environmental consultant - 2  wheels, [place] - single point, Bedside commode, Shower seat, Grab bars - tub/shower  Prior Device Use: Indicate  devices/aids used by the patient prior to current illness, exacerbation or injury? None  Prior Functional Level Prior Function Level of Independence: Independent Comments: patient just returned from trip to [place]: Did the patient need help bathing, dressing, using the toilet or eating?  Independent  Indoor Mobility: Did the patient need assistance with walking from room to room (with or without device)? Independent  Stairs: Did the patient need assistance with internal or external stairs (with or without device)? Independent  Functional Cognition: Did the patient need help planning regular tasks such as shopping or remembering to take medications? Independent  Current Functional Level Cognition  Arousal/Alertness: Awake/alert Overall Cognitive Status: Difficult to assess Difficult to assess due to: Impaired communication Current Attention Level: Sustained Orientation Level: Oriented to person Following Commands: Follows one step commands with increased time, Follows multi-step commands inconsistently General Comments: pt able to follow simple 1 step commands with increased time, but demosntrates difficulty sequencing and problem sovling through automatic tasks with maximal multimodal cueing Attention: Sustained Sustained Attention: Impaired Sustained Attention Impairment: Verbal basic, Functional basic Memory: (TBA) Awareness: Impaired Awareness Impairment: Emergent impairment Problem Solving: Impaired Problem Solving Impairment: Functional basic Safety/Judgment: Impaired    Extremity Assessment (includes Sensation/Coordination)  Upper Extremity Assessment: RUE deficits/detail RUE Deficits / Details: strength 3+/5, bradykiensic movements, imapired functional coordination and decreased attention to UE  RUE Sensation: decreased light touch,  decreased proprioception(difficult to assess) RUE Coordination: decreased fine motor, decreased gross motor  Lower Extremity Assessment: Defer to PT evaluation RLE Deficits / Details: modest assymetry in RLE strength upon testing but overall functional bilaterally RLE Coordination: decreased gross motor(due to delayed initiation )    ADLs  Overall ADL's : Needs assistance/impaired Eating/Feeding: Minimal assistance Eating/Feeding Details (indicate cue type and reason): taking a sip of water  Grooming: Oral care, Moderate assistance, Brushing hair, Standing Grooming Details (indicate cue type and reason): maximal multimodal cueing to sequence and problem solve, min assist in standing  Upper Body Bathing: Moderate assistance, Sitting Lower Body Bathing: Maximal assistance, Sit to/from stand Upper Body Dressing : Moderate assistance, Sitting Lower Body Dressing: Maximal assistance, Sit to/from stand Toilet Transfer: Minimal assistance, Ambulation Toilet Transfer Details (indicate cue type and reason): simulated in room Functional mobility during ADLs: Minimal assistance, Cueing for sequencing, Cueing for safety General ADL Comments: patient requires increased assist for self care tasks due to decreased sequencing, problem sovling, intation, and decreased functional use of R UE     Mobility  Overal bed mobility: Needs Assistance Bed Mobility: Supine to Sit Rolling: Min assist Supine to sit: Min guard General bed mobility comments: min guard for safety and initation of mobility, multimodal cueing     Transfers  Overall transfer level: Needs assistance Equipment used: None Transfers: Sit to/from Stand Sit to Stand: Min guard General transfer comment: min guard for safety, postural sway noted in standing and reaching for railing on bed observed.      Ambulation / Gait / Stairs / Wheelchair Mobility  Ambulation/Gait Ambulation/Gait assistance: Herbalist (Feet): 70  Feet Assistive device: None Gait Pattern/deviations: Step-through pattern, Decreased stride length, Drifts right/left, Staggering right, Staggering left General Gait Details: patient with poor environmental awareness specific to the right side. Increased assist for stability. LOB noted with vertical and horizontal head turns.   Gait velocity: decreased Gait velocity interpretation: <1.8 ft/sec, indicate of risk for recurrent falls    Posture / Balance Balance Overall balance assessment: Needs assistance Sitting-balance support: No upper extremity supported,  Feet supported Sitting balance-Leahy Scale: Fair Standing balance support: During functional activity, No upper extremity supported Standing balance-Leahy Scale: Poor Standing balance comment: hands on assist for stability High level balance activites: Backward walking, Direction changes, Turns, Sudden stops, Head turns High Level Balance Comments: min to moderate assist for LOB x2 during higher level task performance    Special needs/care consideration BiPAP/CPAP No CPM No Continuous Drip IV No Dialysis No      Life Vest No Oxygen No Special Bed No Trach Size No Wound Vac (area) No    Skin No                             Bowel mgmt: Last BM 01/15/18 Bladder mgmt: Voiding WDL Diabetic mgmt No    Previous Home Environment Living Arrangements: Alone Available Help at Discharge: Family, Friend(s) Type of Home: Independent living facility Home Layout: One level Home Access: Level entry Bathroom Shower/Tub: Multimedia programmer: Standard Home Care Services: No  Discharge Living Setting Plans for Discharge Living Setting: Alone, Other (Comment)(Lives alone townhouse at [facility] Type of Home at Discharge: Other (Comment)(Townhouse at [place] section.) Discharge Home Layout: One level Discharge Home Access: Level entry Discharge Bathroom Shower/Tub: Walk-in shower, Curtain Discharge Bathroom Toilet:  Handicapped height Discharge Bathroom Accessibility: Yes How Accessible: Accessible via wheelchair, Accessible via walker Does the patient have any problems obtaining your medications?: No  Social/Family/Support Systems Patient Roles: Parent, Other (Comment)(Has 2 children and a granddaughter.) Contact Information: Blenda Mounts - daughter Anticipated Caregiver: Daughter and grand daughter Anticipated Caregiver's Contact Information: [name] - daughter - [phone] Ability/Limitations of Caregiver: Daughter and grand daughter will share caregiver duties. Has friends who can assist as well after discharge. Caregiver Availability: Other (Comment)(Dtr aware of need for 24/7 supervision after discharge.) Discharge Plan Discussed with Primary Caregiver: Yes Is Caregiver In Agreement with Plan?: Yes Does Caregiver/Family have Issues with Lodging/Transportation while Pt is in Rehab?: No  Goals/Additional Needs Patient/Family Goal for Rehab: PT/OT supervision, SLP min assist goals Expected length of stay: 12-15 days Cultural Considerations: Attends [employer]. Dietary Needs: Regular diet, thin liquids Equipment Needs: TBD Pt/Family Agrees to Admission and willing to participate: Yes(Talked with patient and daughter, Blenda Mounts.) Program Orientation Provided & Reviewed with Pt/Caregiver Including Roles  & Responsibilities: Yes  Decrease burden of Care through IP rehab admission: N/A  Possible need for SNF placement upon discharge: Potentially  Patient Condition: This patient's medical and functional status has changed since the consult dated: 01/16/18 in which the Rehabilitation Physician determined and documented that the patient's condition is appropriate for intensive rehabilitative care in an inpatient rehabilitation facility. See "History of Present Illness" (above) for medical update. Functional changes are: Currently requiring min assist to ambulate 70 feet no device.  Patient's medical and functional status update has been discussed with the Rehabilitation physician and patient remains appropriate for inpatient rehabilitation. Will admit to inpatient rehab today.  Preadmission Screen Completed By:  Courtney Willis, 01/18/2018 9:58 AM ______________________________________________________________________   Discussed status with Dr. Posey Pronto on 01/18/18 at 55 and received telephone approval for admission today.  Admission Coordinator:  Courtney Willis, time 1001/Date 01/18/18       Revision History

## 2018-01-18 NOTE — Progress Notes (Signed)
IP rehab admissions - Patient has been cleared medically by attending MD.  Bed available on rehab and will admit to CIR today.  Call me for questions.  [phone]

## 2018-01-18 NOTE — Care Management Note (Signed)
Case Management Note  Patient Details  Name: SWAN FAIRFAX MRN: [id number] Date of Birth: 1934-02-18  Subjective/Objective:      Pt admitted with ICH. She is from [place].               Action/Plan: Pt is discharging to CIR today. CM signing off.   Expected Discharge Date:  01/18/18               Expected Discharge Plan:  [place]  In-House Referral:     Discharge planning Services  CM Consult  Post Acute Care Choice:    Choice offered to:     DME Arranged:    DME Agency:     HH Arranged:    HH Agency:     Status of Service:  Completed, signed off  If discussed at [employer], dates discussed:    Additional Comments:  Pollie Friar, RN 01/18/2018, 11:37 AM

## 2018-01-19 ENCOUNTER — Inpatient Hospital Stay (HOSPITAL_COMMUNITY): Payer: Medicare Other

## 2018-01-19 ENCOUNTER — Inpatient Hospital Stay (HOSPITAL_COMMUNITY): Payer: Medicare Other | Admitting: Physical Therapy

## 2018-01-19 ENCOUNTER — Inpatient Hospital Stay (HOSPITAL_COMMUNITY): Payer: Medicare Other | Admitting: Speech Pathology

## 2018-01-19 DIAGNOSIS — R481 Agnosia: Secondary | ICD-10-CM

## 2018-01-19 DIAGNOSIS — I69398 Other sequelae of cerebral infarction: Secondary | ICD-10-CM

## 2018-01-19 DIAGNOSIS — R269 Unspecified abnormalities of gait and mobility: Secondary | ICD-10-CM

## 2018-01-19 DIAGNOSIS — I69390 Apraxia following cerebral infarction: Secondary | ICD-10-CM

## 2018-01-19 LAB — CBC WITH DIFFERENTIAL/PLATELET
Abs Immature Granulocytes: 0.03 10*3/uL (ref 0.00–0.07)
Basophils Absolute: 0 10*3/uL (ref 0.0–0.1)
Basophils Relative: 1 %
EOS ABS: 0.1 10*3/uL (ref 0.0–0.5)
Eosinophils Relative: 3 %
HCT: 36.1 % (ref 36.0–46.0)
Hemoglobin: 11.9 g/dL — ABNORMAL LOW (ref 12.0–15.0)
Immature Granulocytes: 1 %
Lymphocytes Relative: 22 %
Lymphs Abs: 1.1 10*3/uL (ref 0.7–4.0)
MCH: 31.2 pg (ref 26.0–34.0)
MCHC: 33 g/dL (ref 30.0–36.0)
MCV: 94.5 fL (ref 80.0–100.0)
MONOS PCT: 11 %
Monocytes Absolute: 0.6 10*3/uL (ref 0.1–1.0)
Neutro Abs: 3.3 10*3/uL (ref 1.7–7.7)
Neutrophils Relative %: 62 %
Platelets: 255 10*3/uL (ref 150–400)
RBC: 3.82 MIL/uL — ABNORMAL LOW (ref 3.87–5.11)
RDW: 12.3 % (ref 11.5–15.5)
WBC: 5.2 10*3/uL (ref 4.0–10.5)
nRBC: 0 % (ref 0.0–0.2)

## 2018-01-19 LAB — COMPREHENSIVE METABOLIC PANEL
ALT: 10 U/L (ref 0–44)
AST: 13 U/L — ABNORMAL LOW (ref 15–41)
Albumin: 2.9 g/dL — ABNORMAL LOW (ref 3.5–5.0)
Alkaline Phosphatase: 50 U/L (ref 38–126)
Anion gap: 10 (ref 5–15)
BUN: 11 mg/dL (ref 8–23)
CO2: 26 mmol/L (ref 22–32)
Calcium: 8.9 mg/dL (ref 8.9–10.3)
Chloride: 102 mmol/L (ref 98–111)
Creatinine, Ser: 0.8 mg/dL (ref 0.44–1.00)
GFR calc Af Amer: >60 mL/min (ref >60)
Glucose, Bld: 101 mg/dL — ABNORMAL HIGH (ref 70–99)
Potassium: 3.8 mmol/L (ref 3.5–5.1)
Sodium: 138 mmol/L (ref 135–145)
Total Bilirubin: 0.9 mg/dL (ref 0.3–1.2)
Total Protein: 5.5 g/dL — ABNORMAL LOW (ref 6.5–8.1)

## 2018-01-19 NOTE — Evaluation (Signed)
Physical Therapy Assessment and Plan  Patient Details  Name: Courtney Willis MRN: [id number] Date of Birth: 12-24-1934  PT Diagnosis: Abnormality of gait, Cognitive deficits, Coordination disorder, Difficulty walking, Impaired cognition and Muscle weakness Rehab Potential: Good ELOS: 5-7 days   Today's Date: 01/19/2018 PT Individual Time: 1400-1455 PT Individual Time Calculation (min): 55 min    Problem List:  Patient Active Problem List   Diagnosis Date Noted  . SAH (subarachnoid hemorrhage) ([place]) 01/18/2018  . Intraparenchymal hemorrhage of brain ([place])   . Dyslipidemia   . Hyponatremia   . Tobacco abuse   . Hyperlipidemia   . Acute congestive heart failure with left ventricular diastolic dysfunction ([place])   . Acute blood loss anemia   . ICH (intracerebral hemorrhage) ([place]) 01/13/2018  . Subclinical hypothyroidism 07/20/2017  . Bunion 08/12/2014  . Cataract 08/12/2014  . CN (constipation) 08/12/2014  . Edema of foot 08/12/2014  . Fibroids, submucosal 08/12/2014  . Glaucoma 08/12/2014  . Hypercholesteremia 08/12/2014  . Coitalgia 08/12/2014  . 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 Avitaminosis D 08/12/2014  . Post menopausal syndrome 08/12/2014  . Alcohol use 08/06/2014  . Acid reflux 02/26/2008  . DD (diverticular disease) 02/16/2008  . Bowel disease 02/16/2008    Past Medical History:  Past Medical History:  Diagnosis Date  . Acid reflux 02/26/2008  . Arthritis   . 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 Avitaminosis D 08/12/2014  . Bowel disease 02/16/2008  . Bunion 08/12/2014  . Cancer [facility])    Skin cancer- basal- upper arm , upper chest  . CN (constipation) 08/12/2014  . DD (diverticular disease) 02/16/2008  . Elevated liver enzymes 08/12/2014  . Fibroids, submucosal 08/12/2014   of her lower lip   . Hypercholesteremia 08/12/2014  . 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 Post menopausal syndrome 08/12/2014   Past Surgical History:  Past Surgical History:   Procedure Laterality Date  . BUNIONECTOMY     05/2004, 2007  . CATARACT EXTRACTION     04/2005, 06/2005  . COLONOSCOPY W/ POLYPECTOMY    . COLONOSCOPY WITH PROPOFOL N/A 02/15/2017   Procedure: COLONOSCOPY WITH PROPOFOL;  Surgeon: Jonathon Bellows, MD;  Location: [facility] ENDOSCOPY;  Service: Gastroenterology;  Laterality: N/A;  . HYSTEROSCOPY  2011  . ORIF WRIST FRACTURE Right 08/30/2015   Procedure: OPEN REDUCTION INTERNAL FIXATION (ORIF) RIGHT WRIST FRACTURE AND REPAIR AS NECESSARY;  Surgeon: Roseanne Kaufman, MD;  Location: [place];  Service: Orthopedics;  Laterality: Right;  . UPPER GI ENDOSCOPY    . WRIST FRACTURE SURGERY Left    06/2006    Assessment & Plan Clinical Impression: Patient is a 83 year old right-handed female with history of hyperlipidemia and tobacco abuse. History taken from chart review and family. Presented 01/13/2018 with aphasia. Per report patient lives alone independent living facility/[facility] prior to admission and very active. One level home with level entry. Patient was found by friend wandering about the house half dressed. CT of the head reviewed, showing left frontal lobe hemorrhage.Per report, acute intracranial hemorrhage in the anterior superior frontal gyrus with extension into the left subarachnoid space. Surrounding edema and regional mass effect with a 4-5 millimeter rightward midline shift. Patient did not receive TPA secondary to hemorrhage. MRI the brain showed a large left frontal lobe intraparenchymal hemorrhage and unchanged 4 mm rightward midline shift. CT angiogram of head and neck with no significant stenosis or large vessel occlusion. Latest follow-up cranial CT scan 01/15/2018 stable. Echocardiogram with ejection fraction of 70% grade  1 diastolic dysfunction. Patient is tolerating a regular diet. Therapy evaluations completed with recommendations of physical medicine rehabilitation consult. Patient transferred to CIR on 01/18/2018 .   Patient currently  requires min with mobility secondary to muscle weakness, decreased cardiorespiratoy endurance, motor apraxia, decreased coordination and decreased motor planning, decreased attention to right, decreased initiation, decreased attention, decreased awareness, decreased problem solving, decreased safety awareness, decreased memory and delayed processing and decreased standing balance, decreased postural control and decreased balance strategies.  Prior to hospitalization, patient was independent  with mobility and lived with Family(3 children planning to arrange for 24/7 supervision) in a Independent living facility(condo at independent living facility) home.  Home access is  Level entry.  Patient will benefit from skilled PT intervention to maximize safe functional mobility, minimize fall risk and decrease caregiver burden for planned discharge home with 24 hour supervision.  Anticipate patient will benefit from follow up OP at discharge.  PT - End of Session Activity Tolerance: Tolerates < 10 min activity, no significant change in vital signs Endurance Deficit: Yes Endurance Deficit Description: mild generalized deconditioning PT Assessment Rehab Potential (ACUTE/IP ONLY): Good PT Barriers to Discharge: Behavior PT Barriers to Discharge Comments: impulsivity, poor frustration tolerance PT Patient demonstrates impairments in the following area(s): Balance;Behavior;Edema;Endurance;Motor;Perception;Safety PT Transfers Functional Problem(s): Bed Mobility;Bed to Chair;Car;Furniture;Floor PT Locomotion Functional Problem(s): Stairs;Ambulation PT Plan PT Intensity: Minimum of 1-2 x/day ,45 to 90 minutes PT Frequency: 5 out of 7 days PT Duration Estimated Length of Stay: 5-7 days PT Treatment/Interventions: Ambulation/gait training;Community reintegration;DME/adaptive equipment instruction;Neuromuscular re-education;Stair training;Psychosocial support;UE/LE Strength taining/ROM;Wheelchair  propulsion/positioning;UE/LE Coordination activities;Therapeutic Activities;Skin care/wound management;Pain management;Functional electrical stimulation;Discharge planning;Balance/vestibular training;Cognitive remediation/compensation;Disease management/prevention;Functional mobility training;Patient/family education;Splinting/orthotics;Therapeutic Exercise;Visual/perceptual remediation/compensation PT Transfers Anticipated Outcome(s): supervision PT Locomotion Anticipated Outcome(s): supervision community gait w/ LRAD PT Recommendation Follow Up Recommendations: Outpatient PT Patient destination: Home(independent living) Equipment Recommended: To be determined  Skilled Therapeutic Intervention  Pt in supine and agreeable to therapy, no c/o or evidence of pain. Son present to confirm and help w/ PLOF and d/c plan information. He confirms that himself and the pt's 2 daughters are planning to rotate to provide 24/7 supervision. Performed functional mobility as detailed below including gait, transfers, bed mobility, stairs, and car transfer. All mobility performed w/ CGA-min assist. Pt consistently following simple 1-step commands, however inconsistent w/ yes/no responses. Performed Berg Balance Scale as well. Instructed pt and son in results of PT evaluation as detailed below, PT POC, rehab potential, rehab goals, and discharge recommendations. Additionally discussed CIR's policies regarding fall safety and use of chair alarm and/or quick release belt. Pt and son verbalized understanding and in agreement. Ended session in supine, all needs in reach.  PT Evaluation Precautions/Restrictions Precautions Precautions: Fall;Other (comment) Precaution Comments: Impulsive Restrictions Weight Bearing Restrictions: No General   Vital SignsTherapy Vitals Pulse Rate: 93 Resp: 14 BP: (!) 148/79 Patient Position (if appropriate): Lying Oxygen Therapy SpO2: (!) 65 % O2 Device: Room Air Pain Pain  Assessment Pain Scale: 0-10 Pain Score: 0-No pain Home Living/Prior Functioning Home Living Living Arrangements: Alone Available Help at Discharge: Family;Friend(s) Type of Home: Independent living facility(condo at independent living facility) Home Access: Level entry Home Layout: One level Bathroom Shower/Tub: Multimedia programmer: Standard  Lives With: Family(3 children planning to arrange for 24/7 supervision) Prior Function Level of Independence: Independent with basic ADLs;Independent with gait;Independent with transfers;Independent with homemaking with ambulation  Able to Take Stairs?: Yes Driving: Yes Vocation: Retired Comments: pt very active, would travel, do yoga, etc Vision/Perception  Vision -  Assessment Additional Comments: difficult to formally assess 2/2 cognition, no deficits observed during session, will continue to monitor Perception Perception: Impaired Inattention/Neglect: Does not attend to right side of body;Other (comment)(Does not attent to right environment) Praxis Praxis: Impaired Praxis Impairment Details: Motor planning;Perseveration;Ideomotor;Initiation;Ideation  Cognition Overall Cognitive Status: Impaired/Different from baseline Arousal/Alertness: Awake/alert Orientation Level: Oriented to person;Disoriented to time;Disoriented to situation;Disoriented to place(able to say what hospital she was at and what year it is, unable to accurately state city and why she is here) Attention: Sustained Sustained Attention: Impaired Sustained Attention Impairment: Functional basic Memory: (unable to assess) Awareness: Impaired Awareness Impairment: Intellectual impairment Problem Solving: Impaired Problem Solving Impairment: Functional basic Executive Function: Initiating Initiating: Impaired Initiating Impairment: Functional complex Behaviors: Perseveration;Impulsive;Poor frustration tolerance Safety/Judgment:  Impaired Sensation Sensation Light Touch: (unable to formally assess 2/2 cognition) Hot/Cold: Appears Intact Proprioception: Appears Intact Coordination Gross Motor Movements are Fluid and Coordinated: No Fine Motor Movements are Fluid and Coordinated: Yes Coordination and Movement Description: slow and cautious movements Heel Shin Test: impaired bilaterally Motor  Motor Motor: Hemiplegia Motor - Skilled Clinical Observations: Mild R hemi, generalized weakness  Mobility Bed Mobility Bed Mobility: Supine to Sit;Sit to Supine;Rolling Left;Rolling Right Rolling Right: Supervision/verbal cueing Rolling Left: Supervision/Verbal cueing Supine to Sit: Contact Guard/Touching assist Sit to Supine: Contact Guard/Touching assist Transfers Transfers: Sit to Stand;Stand to Sit;Stand Pivot Transfers Sit to Stand: Contact Guard/Touching assist Stand to Sit: Contact Guard/Touching assist Stand Pivot Transfers: Minimal Assistance - Patient > 75% Stand Pivot Transfer Details: Verbal cues for sequencing;Verbal cues for technique;Verbal cues for precautions/safety Transfer (Assistive device): 1 person hand held assist Locomotion  Gait Ambulation: Yes Gait Assistance: Contact Guard/Touching assist Gait Distance (Feet): 150 Feet Assistive device: 1 person hand held assist Gait Gait velocity: decreased Stairs / Additional Locomotion Stairs: Yes Stairs Assistance: Contact Guard/Touching assist Stair Management Technique: Two rails Number of Stairs: 4 Height of Stairs: 6 Wheelchair Mobility Wheelchair Mobility: No  Trunk/Postural Assessment  Cervical Assessment Cervical Assessment: Within Functional Limits Thoracic Assessment Thoracic Assessment: Within Functional Limits Lumbar Assessment Lumbar Assessment: Within Functional Limits Postural Control Postural Control: Within Functional Limits  Balance Balance Balance Assessed: Yes Standardized Balance Assessment Standardized Balance  Assessment: Berg Balance Test Berg Balance Test Sit to Stand: Able to stand without using hands and stabilize independently Standing Unsupported: Able to stand safely 2 minutes Sitting with Back Unsupported but Feet Supported on Floor or Stool: Able to sit safely and securely 2 minutes Stand to Sit: Controls descent by using hands Transfers: Able to transfer safely, definite need of hands Standing Unsupported with Eyes Closed: Able to stand 10 seconds with supervision Standing Ubsupported with Feet Together: Able to place feet together independently and stand for 1 minute with supervision From Standing, Reach Forward with Outstretched Arm: Can reach forward >12 cm safely (5") From Standing Position, Pick up Object from Floor: Able to pick up shoe, needs supervision From Standing Position, Turn to Look Behind Over each Shoulder: Turn sideways only but maintains balance Turn 360 Degrees: Able to turn 360 degrees safely but slowly Standing Unsupported, Alternately Place Feet on Step/Stool: Able to complete 4 steps without aid or supervision Standing Unsupported, One Foot in Front: Able to take small step independently and hold 30 seconds Standing on One Leg: Able to lift leg independently and hold equal to or more than 3 seconds Total Score: 40 Static Sitting Balance Static Sitting - Balance Support: Feet supported Static Sitting - Level of Assistance: 5: Stand by assistance Dynamic Sitting  Balance Dynamic Sitting - Balance Support: Feet supported Dynamic Sitting - Level of Assistance: 5: Stand by assistance Dynamic Sitting Balance - Compensations: posterior lean Dynamic Sitting - Balance Activities: Reaching for objects Static Standing Balance Static Standing - Balance Support: During functional activity Static Standing - Level of Assistance: 5: Stand by assistance Dynamic Standing Balance Dynamic Standing - Balance Support: During functional activity Dynamic Standing - Level of  Assistance: 4: Min assist Dynamic Standing - Balance Activities: Reaching for objects Extremity Assessment  RUE Assessment RUE Assessment: Exceptions to [facility] Active Range of Motion (AROM) Comments: WFL RUE Body System: Neuro Brunstrum levels for arm and hand: Hand;Arm Brunstrum level for arm: Stage IV Movement is deviating from synergy Brunstrum level for hand: Stage IV Movements deviating from synergies LUE Assessment LUE Assessment: Within Functional Limits RLE Assessment RLE Assessment: Exceptions to [facility] Passive Range of Motion (PROM) Comments: [facility] General Strength Comments: 4- to 4/5 globally LLE Assessment LLE Assessment: Exceptions to [facility] Passive Range of Motion (PROM) Comments: WFL General Strength Comments: 4- to 4/5 globally    Refer to Care Plan for Long Term Goals  Recommendations for other services: None   Discharge Criteria: Patient will be discharged from PT if patient refuses treatment 3 consecutive times without medical reason, if treatment goals not met, if there is a change in medical status, if patient makes no progress towards goals or if patient is discharged from hospital.  The above assessment, treatment plan, treatment alternatives and goals were discussed and mutually agreed upon: by patient and by family  Jameriah Trotti Clent Demark 01/19/2018, 2:58 PM

## 2018-01-19 NOTE — Progress Notes (Signed)
Social Work  Social Work Assessment and Plan  Patient Details  Name: Courtney Willis MRN: [id number] Date of Birth: Jun 30, 1934  Today's Date: 01/19/2018  Problem List:  Patient Active Problem List   Diagnosis Date Noted  . SAH (subarachnoid hemorrhage) ([place]) 01/18/2018  . Intraparenchymal hemorrhage of brain ([place])   . Dyslipidemia   . Hyponatremia   . Tobacco abuse   . Hyperlipidemia   . Acute congestive heart failure with left ventricular diastolic dysfunction ([place])   . Acute blood loss anemia   . ICH (intracerebral hemorrhage) ([place]) 01/13/2018  . Subclinical hypothyroidism 07/20/2017  . Bunion 08/12/2014  . Cataract 08/12/2014  . CN (constipation) 08/12/2014  . Edema of foot 08/12/2014  . Fibroids, submucosal 08/12/2014  . Glaucoma 08/12/2014  . Hypercholesteremia 08/12/2014  . Coitalgia 08/12/2014  . 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 Avitaminosis D 08/12/2014  . Post menopausal syndrome 08/12/2014  . Alcohol use 08/06/2014  . Acid reflux 02/26/2008  . DD (diverticular disease) 02/16/2008  . Bowel disease 02/16/2008   Past Medical History:  Past Medical History:  Diagnosis Date  . Acid reflux 02/26/2008  . Arthritis   . 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 Avitaminosis D 08/12/2014  . Bowel disease 02/16/2008  . Bunion 08/12/2014  . Cancer [facility])    Skin cancer- basal- upper arm , upper chest  . CN (constipation) 08/12/2014  . DD (diverticular disease) 02/16/2008  . Elevated liver enzymes 08/12/2014  . Fibroids, submucosal 08/12/2014   of her lower lip   . Hypercholesteremia 08/12/2014  . 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 Post menopausal syndrome 08/12/2014   Past Surgical History:  Past Surgical History:  Procedure Laterality Date  . BUNIONECTOMY     05/2004, 2007  . CATARACT EXTRACTION     04/2005, 06/2005  . COLONOSCOPY W/ POLYPECTOMY    . COLONOSCOPY WITH PROPOFOL N/A 02/15/2017   Procedure: COLONOSCOPY WITH PROPOFOL;  Surgeon: Jonathon Bellows, MD;   Location: [facility] ENDOSCOPY;  Service: Gastroenterology;  Laterality: N/A;  . HYSTEROSCOPY  2011  . ORIF WRIST FRACTURE Right 08/30/2015   Procedure: OPEN REDUCTION INTERNAL FIXATION (ORIF) RIGHT WRIST FRACTURE AND REPAIR AS NECESSARY;  Surgeon: Roseanne Kaufman, MD;  Location: [place];  Service: Orthopedics;  Laterality: Right;  . UPPER GI ENDOSCOPY    . WRIST FRACTURE SURGERY Left    06/2006   Social History:  reports that she has quit smoking. She has never used smokeless tobacco. She reports current alcohol use of about 3.0 - 4.0 standard drinks of alcohol per week. She reports that she does not use drugs.  Family / Support Systems Marital Status: Divorced Patient Roles: Parent, Volunteer Children: [name] Hundley-daughter [place] Nyborg-son [phone] Other Supports: [place] [phone] Anticipated Caregiver: Daughter and granddaughter along with a LTC policy Ability/Limitations of Caregiver: Both have no limitations Caregiver Availability: Other (Comment)(Will arrange for 24 hr care upon DC from here) Family Dynamics: Close knit family who are there for one another, both duaghter, son and grandchildren are local and involved. Pt was extremely acitve and travled the world prior to this happening.  Friends and neighbors who are involved also  Social History Preferred language: English Religion: Protestant Cultural Background: No issues Education: Secretary/administrator educated Read: Yes Write: Yes Employment Status: Retired Public relations account executive Issues: No issues Guardian/Conservator: None-according to MD pt is capable of making her own decisions while here, but will include children due to speech deficits.   Abuse/Neglect Abuse/Neglect Assessment Can Be Completed:  Yes Physical Abuse: Denies Verbal Abuse: Denies Sexual Abuse: Denies Exploitation of patient/patient's resources: Denies Self-Neglect: Denies  Emotional Status Pt's affect, behavior and adjustment  status: Pt is not able to communicate to answer my questions, contacted daughter via telephone to [place] information. She voiced she is ill along with her daughter so not coming in today. Others will be here with her. Daughter states: " My mother was extremely Francene Finders worked out daily and traveld the world. This is her worst nightmare."  Recent Psychosocial Issues: healthy and had not issues except hyperlipidemia Psychiatric History: No history deferred depression screen at this time due to inability to communicate, do feel when appropriate she would benefit from seeing neuro-psych due to risk of depression from this devastating event Substance Abuse History: Quit tobacco years ago  Patient / Family Perceptions, Expectations & Goals Pt/Family understanding of illness & functional limitations: Daughter can explain her hemorrhage and deficits. She has spoken with the MD and feels she has a good understanding. This is the first day not here due to sick herself with a cold. has arranged for others to be here with pt. Premorbid pt/family roles/activities: mom, grandmother, neighbor, friend, Research scientist (physical sciences), etc Anticipated changes in roles/activities/participation: resume Pt/family expectations/goals: Daughter states: " I hope she does well here, I know we will need to be there with her." " I hope she can get her communication back." Pt makes eye contact and nods unsure how accurate she is though  [employer]: Other (Comment)(recently moved into [facility]-independent living townhouse) Premorbid Home Care/DME Agencies: None Transportation available at discharge: Family Resource referrals recommended: Support group (specify)  Discharge Planning Living Arrangements: Alone Support Systems: Children, Other relatives, Friends/neighbors, Social worker community Type of Residence: Private residence Insurance Resources: Multimedia programmer (specify)(UHC-Medicare) Museum/gallery curator Resources: Charity fundraiser Screen Referred: No Living Expenses: Rent Money Management: Patient Does the patient have any problems obtaining your medications?: No Home Management: Self Patient/Family Preliminary Plans: Daughter wants pt to be able to return to her town home where she is most comfortable with she and granddaughter assisting, she also has a Lobbyist she can access. Aware of the rehab at [facility] and she would rather not pursue this, wants Mom at her home. Social Work Anticipated Follow Up Needs: HH/OP, Support Group, ALF/IL  Clinical Impression Pleasant non-communicative female who tries to communicate but not able too. Her tow children and grandchildren are involved and supportive and will be assisting at discharge. Her daughter's goal is to have her return to her own town home with assist from family and agency help. Will await therapy evaluations and work on best plan for pt. According to daughter this is pt's worst nightmare, would benefit from neuro-psych follow when appropriate.  Elease Hashimoto 01/19/2018, 1:24 PM

## 2018-01-19 NOTE — Progress Notes (Signed)
[place] PHYSICAL MEDICINE & REHABILITATION PROGRESS NOTE   Subjective/Complaints:    Objective:   No results found. Recent Labs    01/18/18 0434 01/19/18 0535  WBC 6.2 5.2  HGB 11.2* 11.9*  HCT 35.1* 36.1  PLT 241 255   Recent Labs    01/18/18 0434 01/19/18 0535  NA 134* 138  K 3.5 3.8  CL 101 102  CO2 24 26  GLUCOSE 106* 101*  BUN 13 11  CREATININE 0.62 0.80  CALCIUM 8.6* 8.9    Intake/Output Summary (Last 24 hours) at 01/19/2018 0737 Last data filed at 01/18/2018 1856 Gross per 24 hour  Intake 240 ml  Output -  Net 240 ml     Physical Exam: Vital Signs Blood pressure 124/68, pulse 66, temperature 98.8 F (37.1 C), temperature source Oral, resp. rate 14, weight 62.1 kg, SpO2 97 %.  General: No acute distress Mood and affect are appropriate Heart: Regular rate and rhythm no rubs murmurs or extra sounds Lungs: Clear to auscultation, breathing unlabored, no rales or wheezes Abdomen: Positive bowel sounds, soft nontender to palpation, nondistended Extremities: No clubbing, cyanosis, or edema Skin: No evidence of breakdown, no evidence of rash Neurologic: Cranial nerves II through XII intact, motor strength is 5/5 in left and 4/5 Right deltoid, bicep, tricep, grip, hip flexor, knee extensors, ankle dorsiflexor and plantar flexor Sensory exam reports equal light touch  bilateral upper extremities  Musculoskeletal: Full range of motion in all 4 extremities. No joint swelling    Assessment/Plan: 1. Functional deficits secondary to Left frontal lobe IPH with apraxia , fluent aphasia and visual agnosia , right  which require 3+ hours per day of interdisciplinary therapy in a comprehensive inpatient rehab setting.  Physiatrist is providing close team supervision and 24 hour management of active medical problems listed below.  Physiatrist and rehab team continue to assess barriers to discharge/monitor patient progress toward functional and medical  goals  Care Tool:  Bathing              Bathing assist       Upper Body Dressing/Undressing Upper body dressing        Upper body assist      Lower Body Dressing/Undressing Lower body dressing            Lower body assist       Toileting Toileting    Toileting assist Assist for toileting: Contact Guard/Touching assist     Transfers Chair/bed transfer  Transfers assist     Chair/bed transfer assist level: Minimal Assistance - Patient > 75%     Locomotion Ambulation   Ambulation assist              Walk 10 feet activity   Assist           Walk 50 feet activity   Assist           Walk 150 feet activity   Assist           Walk 10 feet on uneven surface  activity   Assist           Wheelchair     Assist               Wheelchair 50 feet with 2 turns activity    Assist            Wheelchair 150 feet activity     Assist          Medical Problem List  and Plan: 1.   Decreased functional mobility with aphasia secondary to  Left frontal lobe intraparenchymal hemorrhage/SAH likely due to CAA( cerebral amyloid angiopathy) CIR evals today 2.  DVT Prophylaxis/Anticoagulation: SCDs. 3. Pain Management:  Tylenol as needed 4. Mood:  Provide emotional support 5. Neuropsych: This patient is capable of making decisions on her own behalf. 6. Skin/Wound Care:  Routine skin checks 7. Fluids/Electrolytes/Nutrition:  Routine in and out's , BUN/Cr normal 8. Hyperlipidemia. Begin Pravachol 20 mg daily 14.  Hyponatremia: BMP ordered for today, reviewed , normal    LOS: 1 days A FACE TO FACE EVALUATION WAS PERFORMED  Charlett Blake 01/19/2018, 7:37 AM

## 2018-01-19 NOTE — Evaluation (Signed)
Occupational Therapy Assessment and Plan  Patient Details  Name: Courtney Willis MRN: [id number] Date of Birth: 1934/02/05  OT Diagnosis: apraxia, cognitive deficits and hemiplegia affecting dominant side Rehab Potential: Rehab Potential (ACUTE ONLY): Good ELOS: 5-7 days   Today's Date: 01/19/2018 OT Individual Time: 4098-1191 OT Individual Time Calculation (min): 80 min     Problem List:  Patient Active Problem List   Diagnosis Date Noted  . SAH (subarachnoid hemorrhage) ([place]) 01/18/2018  . Intraparenchymal hemorrhage of brain ([place])   . Dyslipidemia   . Hyponatremia   . Tobacco abuse   . Hyperlipidemia   . Acute congestive heart failure with left ventricular diastolic dysfunction ([place])   . Acute blood loss anemia   . ICH (intracerebral hemorrhage) ([place]) 01/13/2018  . Subclinical hypothyroidism 07/20/2017  . Bunion 08/12/2014  . Cataract 08/12/2014  . CN (constipation) 08/12/2014  . Edema of foot 08/12/2014  . Fibroids, submucosal 08/12/2014  . Glaucoma 08/12/2014  . Hypercholesteremia 08/12/2014  . Coitalgia 08/12/2014  . 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 Avitaminosis D 08/12/2014  . Post menopausal syndrome 08/12/2014  . Alcohol use 08/06/2014  . Acid reflux 02/26/2008  . DD (diverticular disease) 02/16/2008  . Bowel disease 02/16/2008    Past Medical History:  Past Medical History:  Diagnosis Date  . Acid reflux 02/26/2008  . Arthritis   . 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 Avitaminosis D 08/12/2014  . Bowel disease 02/16/2008  . Bunion 08/12/2014  . Cancer [facility])    Skin cancer- basal- upper arm , upper chest  . CN (constipation) 08/12/2014  . DD (diverticular disease) 02/16/2008  . Elevated liver enzymes 08/12/2014  . Fibroids, submucosal 08/12/2014   of her lower lip   . Hypercholesteremia 08/12/2014  . 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 Post menopausal syndrome 08/12/2014   Past Surgical History:  Past Surgical History:  Procedure Laterality Date   . BUNIONECTOMY     05/2004, 2007  . CATARACT EXTRACTION     04/2005, 06/2005  . COLONOSCOPY W/ POLYPECTOMY    . COLONOSCOPY WITH PROPOFOL N/A 02/15/2017   Procedure: COLONOSCOPY WITH PROPOFOL;  Surgeon: Jonathon Bellows, MD;  Location: [facility] ENDOSCOPY;  Service: Gastroenterology;  Laterality: N/A;  . HYSTEROSCOPY  2011  . ORIF WRIST FRACTURE Right 08/30/2015   Procedure: OPEN REDUCTION INTERNAL FIXATION (ORIF) RIGHT WRIST FRACTURE AND REPAIR AS NECESSARY;  Surgeon: Roseanne Kaufman, MD;  Location: [facility];  Service: Orthopedics;  Laterality: Right;  . UPPER GI ENDOSCOPY    . WRIST FRACTURE SURGERY Left    06/2006    Assessment & Plan Clinical Impression: ORETA SOLOWAY is an 83 year old right-handed female with history of hyperlipidemia and tobacco abuse. History taken from chart review and family. Presented 01/13/2018 with aphasia. Per report patient lives alone independent living facility/[facility] prior to admission and very active. One level home with level entry. Patient was found by friend wandering about the house half dressed. CT of the head reviewed, showing left frontal lobe hemorrhage.Per report, acute intracranial hemorrhage in the anterior superior frontal gyrus with extension into the left subarachnoid space. Surrounding edema and regional mass effect with a 4-5 millimeter rightward midline shift. Patient did not receive TPA secondary to hemorrhage. MRI the brain showed a large left frontal lobe intraparenchymal hemorrhage and unchanged 4 mm rightward midline shift. CT angiogram of head and neck with no significant stenosis or large vessel occlusion. Latest follow-up cranial CT scan 01/15/2018 stable. Echocardiogram with ejection fraction of 70%  grade 1 diastolic dysfunction. Patient is tolerating a regular diet. Therapy evaluations completed with recommendations of physical medicine rehabilitation consult. Patient was admitted for a comprehensive rehabilitation program.   Patient transferred to  [facility]IR on 01/18/2018 .    Patient currently requires min with basic self-care skills secondary to muscle weakness, motor apraxia, ataxia and decreased coordination, decreased attention to right, decreased motor planning and ideational apraxia, decreased initiation, decreased attention, decreased awareness, decreased problem solving, decreased safety awareness, decreased memory and delayed processing and decreased standing balance and decreased balance strategies.  Prior to hospitalization, patient could complete ADLs/IADLS with independent .  Patient will benefit from skilled intervention to increase independence with basic self-care skills and increase level of independence with iADL prior to discharge home with care partner.  Anticipate patient will require 24 hour supervision and follow up home health.  OT - End of Session Activity Tolerance: Tolerates 30+ min activity with multiple rests Endurance Deficit: Yes Endurance Deficit Description: mild generalized deconditioning OT Assessment Rehab Potential (ACUTE ONLY): Good OT Patient demonstrates impairments in the following area(s): Balance;Endurance;Behavior;Safety;Motor;Cognition OT Basic ADL's Functional Problem(s): Eating;Grooming;Bathing;Dressing;Toileting OT Advanced ADL's Functional Problem(s): Light Housekeeping OT Transfers Functional Problem(s): Toilet;Tub/Shower OT Additional Impairment(s): Fuctional Use of Upper Extremity OT Plan OT Intensity: Minimum of 1-2 x/day, 45 to 90 minutes OT Frequency: 5 out of 7 days OT Duration/Estimated Length of Stay: 5-7 days OT Treatment/Interventions: Balance/vestibular training;Cognitive remediation/compensation;Community reintegration;Discharge planning;Disease mangement/prevention;DME/adaptive equipment instruction;Functional mobility training;Neuromuscular re-education;Patient/family education;Pain management;Psychosocial support;Self Care/advanced ADL retraining;Therapeutic Activities;Therapeutic  Exercise;UE/LE Strength taining/ROM;UE/LE Coordination activities;Visual/perceptual remediation/compensation OT Self Feeding Anticipated Outcome(s): mod I OT Basic Self-Care Anticipated Outcome(s): (S) OT Toileting Anticipated Outcome(s): (S) OT Bathroom Transfers Anticipated Outcome(s): (S) OT Recommendation Recommendations for Other Services: Therapeutic Recreation consult Therapeutic Recreation Interventions: Kitchen group;Outing/community reintergration Patient destination: (TBD- how much assistance ILF can provide) Follow Up Recommendations: Home health OT Equipment Recommended: To be determined   Skilled Therapeutic Intervention Skilled OT evaluation initiated. Pt presenting with global aphasia, both receptive and expressive. Pt able to follow simple commands with 75% accuracy. Pt able to take shower, washing all body parts with cueing, with no physical A and good automaticity. Pt demonstrated dressing/constructional apraxia- requiring max verbal cueing for orienting and sequencing through UB/LB dressing. Pt completed grooming tasks with potential object agnosia present, unclear with receptive aphasia. Spoke briefly with pt's daughter on the phone re. Meal and food preferences. Pt completed dynavision with average reaction time with 3.30 sec with good visual scanning observed without any cueing. Pt completed 100 ft of functional mobility with min HHA. Pt was left sitting up in recliner with telesitter present, chair alarm on, and all needs within reach.   OT Evaluation Precautions/Restrictions  Precautions Precautions: Fall;Other (comment) Precaution Comments: Impulsive Restrictions Weight Bearing Restrictions: No General Chart Reviewed: Yes Family/Caregiver Present: No   Pain Pain Assessment Pain Scale: 0-10 Pain Score: 0-No pain Home Living/Prior Functioning Home Living Family/patient expects to be discharged to:: Private residence Living Arrangements: Alone Available Help  at Discharge: Family, Friend(s) Type of Home: Independent living facility Home Access: Level entry Home Layout: One level Bathroom Shower/Tub: Multimedia programmer: Standard IADL History Homemaking Responsibilities: (unclear, no family present ) Current License: Yes Mode of Transportation: Musician Occupation: Retired Prior Function Level of Independence: Independent with basic ADLs, Independent with gait, Independent with transfers, Independent with homemaking with ambulation Vocation: Retired Comments: patient just returned from trip to [place] Baseline Vision/History: Wears glasses Wears Glasses: At all times Patient Visual  Report: Other (comment)(unable to assess) Vision Assessment?: Vision impaired- to be further tested in functional context Additional Comments: difficult to assess formally, pt tracks and appears to have full ocular ROM. Potentially slight L gaze preference Perception  Perception: Impaired Inattention/Neglect: Impaired-to be further tested in functional context Praxis Praxis: Impaired Praxis Impairment Details: Motor planning;Perseveration;Ideomotor;Initiation;Ideation Cognition Overall Cognitive Status: Difficult to assess Arousal/Alertness: Awake/alert Orientation Level: (unable to formally assess any orientation or cognition) Memory: (unable to assess) Attention: Sustained Sustained Attention: Impaired Sustained Attention Impairment: Functional basic Awareness: Impaired Awareness Impairment: Intellectual impairment Problem Solving: Impaired Problem Solving Impairment: Functional basic Executive Function: Initiating Initiating: Impaired Initiating Impairment: Functional complex Behaviors: Perseveration;Impulsive;Poor frustration tolerance Safety/Judgment: Impaired Sensation Sensation Light Touch: Appears Intact Hot/Cold: Appears Intact Proprioception: Appears Intact Coordination Gross Motor Movements are Fluid and  Coordinated: Yes Fine Motor Movements are Fluid and Coordinated: Yes Motor  Motor Motor: Hemiplegia Motor - Skilled Clinical Observations: slight R hemi Mobility  Bed Mobility Bed Mobility: Supine to Sit;Sit to Supine Supine to Sit: Contact Guard/Touching assist Sit to Supine: Contact Guard/Touching assist Transfers Sit to Stand: Contact Guard/Touching assist Stand to Sit: Contact Guard/Touching assist  Trunk/Postural Assessment  Cervical Assessment Cervical Assessment: Within Functional Limits Thoracic Assessment Thoracic Assessment: Within Functional Limits Lumbar Assessment Lumbar Assessment: Within Functional Limits Postural Control Postural Control: Within Functional Limits  Balance Balance Balance Assessed: Yes Static Sitting Balance Static Sitting - Balance Support: Feet supported Static Sitting - Level of Assistance: 5: Stand by assistance Dynamic Sitting Balance Dynamic Sitting - Balance Support: Feet supported Dynamic Sitting - Level of Assistance: 4: Min assist Dynamic Sitting Balance - Compensations: posterior lean Dynamic Sitting - Balance Activities: Reaching for objects Static Standing Balance Static Standing - Balance Support: During functional activity Static Standing - Level of Assistance: 5: Stand by assistance Dynamic Standing Balance Dynamic Standing - Balance Support: During functional activity Dynamic Standing - Level of Assistance: 4: Min assist Dynamic Standing - Balance Activities: Reaching for objects Extremity/Trunk Assessment RUE Assessment RUE Assessment: Exceptions to [facility] Active Range of Motion (AROM) Comments: WFL RUE Body System: Neuro Brunstrum levels for arm and hand: Hand;Arm Brunstrum level for arm: Stage IV Movement is deviating from synergy Brunstrum level for hand: Stage IV Movements deviating from synergies LUE Assessment LUE Assessment: Within Functional Limits     Refer to Care Plan for Long Term Goals  Recommendations  for other services: Neuropsych and Therapeutic Recreation  Outing/community reintegration   Discharge Criteria: Patient will be discharged from OT if patient refuses treatment 3 consecutive times without medical reason, if treatment goals not met, if there is a change in medical status, if patient makes no progress towards goals or if patient is discharged from hospital.  The above assessment, treatment plan, treatment alternatives and goals were discussed and mutually agreed upon: by patient  Curtis Sites 01/19/2018, 12:25 PM

## 2018-01-19 NOTE — Progress Notes (Signed)
Inpatient Rehabilitation  Per nursing patient was given "Data Collection Information Summary" for Patients in Inpatient Rehabilitation Facilities with attached "Privacy Act [place] Records" upon admission.  Patient information reviewed and entered into eRehab system by Ian Bushman. Loni Beckwith., CCC/SLP, PPS Coordinator.  Information including medical coding, functional ability and quality indicators will be reviewed and updated through discharge.

## 2018-01-19 NOTE — Discharge Instructions (Signed)
Inpatient Rehab Discharge Instructions  [place] Discharge date and time: No discharge date for patient encounter.   Activities/Precautions/ Functional Status: Activity: activity as tolerated Diet: regular diet Wound Care: none needed Functional status:  ___ No restrictions     ___ Walk up steps independently ___ 24/7 supervision/assistance   ___ Walk up steps with assistance ___ Intermittent supervision/assistance  ___ Bathe/dress independently ___ Walk with walker     _x__ Bathe/dress with assistance ___ Walk Independently    ___ Shower independently ___ Walk with assistance    ___ Shower with assistance ___ No alcohol     ___ Return to work/school ________  Special Instructions:  No driving smoking or alcohol  My questions have been answered and I understand these instructions. I will adhere to these goals and the provided educational materials after my discharge from the hospital.  Patient/Caregiver Signature _______________________________ Date __________  Clinician Signature _______________________________________ Date __________  Please bring this form and your medication list with you to all your follow-up doctor's appointments.

## 2018-01-19 NOTE — Evaluation (Signed)
Speech Language Pathology Assessment and Plan  Patient Details  Name: Courtney Willis MRN: [id number] Date of Birth: 02/25/1934  SLP Diagnosis: Aphasia;Cognitive Impairments  Rehab Potential: Good ELOS: 5-7 days     Today's Date: 01/19/2018 SLP Individual Time: 1305-1400 SLP Individual Time Calculation (min): 55 min   Problem List:  Patient Active Problem List   Diagnosis Date Noted  . SAH (subarachnoid hemorrhage) ([place]) 01/18/2018  . Intraparenchymal hemorrhage of brain ([place])   . Dyslipidemia   . Hyponatremia   . Tobacco abuse   . Hyperlipidemia   . Acute congestive heart failure with left ventricular diastolic dysfunction ([place])   . Acute blood loss anemia   . ICH (intracerebral hemorrhage) ([place]) 01/13/2018  . Subclinical hypothyroidism 07/20/2017  . Bunion 08/12/2014  . Cataract 08/12/2014  . CN (constipation) 08/12/2014  . Edema of foot 08/12/2014  . Fibroids, submucosal 08/12/2014  . Glaucoma 08/12/2014  . Hypercholesteremia 08/12/2014  . Coitalgia 08/12/2014  . 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 Avitaminosis D 08/12/2014  . Post menopausal syndrome 08/12/2014  . Alcohol use 08/06/2014  . Acid reflux 02/26/2008  . DD (diverticular disease) 02/16/2008  . Bowel disease 02/16/2008   Past Medical History:  Past Medical History:  Diagnosis Date  . Acid reflux 02/26/2008  . Arthritis   . 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 Avitaminosis D 08/12/2014  . Bowel disease 02/16/2008  . Bunion 08/12/2014  . Cancer [facility])    Skin cancer- basal- upper arm , upper chest  . CN (constipation) 08/12/2014  . DD (diverticular disease) 02/16/2008  . Elevated liver enzymes 08/12/2014  . Fibroids, submucosal 08/12/2014   of her lower lip   . Hypercholesteremia 08/12/2014  . 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 Post menopausal syndrome 08/12/2014   Past Surgical History:  Past Surgical History:  Procedure Laterality Date  . BUNIONECTOMY     05/2004, 2007  . CATARACT EXTRACTION      04/2005, 06/2005  . COLONOSCOPY W/ POLYPECTOMY    . COLONOSCOPY WITH PROPOFOL N/A 02/15/2017   Procedure: COLONOSCOPY WITH PROPOFOL;  Surgeon: Jonathon Bellows, MD;  Location: [facility] ENDOSCOPY;  Service: Gastroenterology;  Laterality: N/A;  . HYSTEROSCOPY  2011  . ORIF WRIST FRACTURE Right 08/30/2015   Procedure: OPEN REDUCTION INTERNAL FIXATION (ORIF) RIGHT WRIST FRACTURE AND REPAIR AS NECESSARY;  Surgeon: Roseanne Kaufman, MD;  Location: [facility];  Service: Orthopedics;  Laterality: Right;  . UPPER GI ENDOSCOPY    . WRIST FRACTURE SURGERY Left    06/2006    Assessment / Plan / Recommendation Clinical Impression   Courtney Willis is an 83 year old right-handed female with history of hyperlipidemia and tobacco abuse. History taken from chart review and family. Presented 01/13/2018 with aphasia. Per report patient lives alone independent living facility/[facility] prior to admission and very active. One level home with level entry. Patient was found by friend wandering about the house half dressed. CT of the head reviewed, showing left frontal lobe hemorrhage. Per report, acute intracranial hemorrhage in the anterior superior frontal gyrus with extension into the left subarachnoid space. Surrounding edema and regional mass effect with a 4-5 millimeter rightward midline shift. Patient did not receive TPA secondary to hemorrhage. MRI the brain showed a large left frontal lobe intraparenchymal hemorrhage and unchanged 4 mm rightward midline shift. CT angiogram of head and neck with no significant stenosis or large vessel occlusion. Latest follow-up cranial CT scan 01/15/2018 stable. Echocardiogram with ejection fraction of 16% grade 1 diastolic dysfunction.  Patient is tolerating a regular diet. Therapy evaluations completed with recommendations of physical medicine rehabilitation consult. Patient was admitted for a comprehensive rehabilitation program on 01/18/2018 with SLP evaluation completed on 01/19/2018 with the  following results:  Pt presents with grossly intact oropharyngeal swallowing function.  No overt s/s of aspiration were evident with solids or liquids and pt was able to clear boluses from the oral cavity without difficulty.  No additional needs for dysphagia indicated at this time.  Pt presents with a moderately severe global aphasia where receptive abilities appear to exceed expressive.  During structured naming tasks, pt is overall mod assist for word finding but functionally pt needs max assist to convey needs and wants to caregivers due to echolalia, word finding difficulty, and inconsistent yes/no responses.  Pt's son, Catalina Antigua was present throughout evaluation and therapist provided skilled education regarding strategies to maximize pt's functional independence for communication.  All questions were answered to his satisfaction at this time.  Given the abovementioned deficits, pt would benefit from skilled ST while inpatient in order to maximize functional independence and reduce burden of care prior to discharge.  Anticipate that pt will need 24/7 supervision at discharge in addition to [place] follow up at next level of care.    Skilled Therapeutic Interventions          Cognitive-linguistic evaluation completed with results and recommendations reviewed with family.     SLP Assessment  Patient will need skilled Speech Lanaguage Pathology Services during CIR admission    Recommendations  SLP Diet Recommendations: Age appropriate regular solids;Thin Liquid Administration via: Cup;Straw Medication Administration: Whole meds with liquid Supervision: Patient able to self feed;Full supervision/cueing for compensatory strategies Compensations: Minimize environmental distractions;Small sips/bites;Slow rate Postural Changes and/or Swallow Maneuvers: Out of bed for meals;Seated upright 90 degrees;Upright 30-60 min after meal Oral Care Recommendations: Oral care BID Patient destination: Home Follow up  Recommendations: Outpatient SLP;24 hour supervision/assistance Equipment Recommended: None recommended by SLP    SLP Frequency 3 to 5 out of 7 days   SLP Duration  SLP Intensity  SLP Treatment/Interventions 5-7 days   Minumum of 1-2 x/day, 30 to 90 minutes  Environmental controls;Functional tasks;Internal/external aids;Patient/family education;Cognitive remediation/compensation;Cueing hierarchy    Pain Pain Assessment Pain Scale: 0-10 Pain Score: 0-No pain  Prior Functioning Cognitive/Linguistic Baseline: Within functional limits Type of Home: Independent living facility  Lives With: Family Available Help at Discharge: Family;Friend(s);Available 24 hours/day Vocation: Retired  Industrial/product designer Term Goals: Week 1: SLP Short Term Goal 1 (Week 1): STG=LTG due to ELOS  Refer to Care Plan for Long Term Goals  Recommendations for other services: None   Discharge Criteria: Patient will be discharged from SLP if patient refuses treatment 3 consecutive times without medical reason, if treatment goals not met, if there is a change in medical status, if patient makes no progress towards goals or if patient is discharged from hospital.  The above assessment, treatment plan, treatment alternatives and goals were discussed and mutually agreed upon: by patient and by family  Emilio Math 01/19/2018, 4:26 PM

## 2018-01-19 NOTE — Care Management Note (Signed)
[facility] Individual Statement of Services  Patient Name:  Courtney Willis  Date:  01/19/2018  Welcome to the [facility].  Our goal is to provide you with an individualized program based on your diagnosis and situation, designed to meet your specific needs.  With this comprehensive rehabilitation program, you will be expected to participate in at least 3 hours of rehabilitation therapies Monday-Friday, with modified therapy programming on the weekends.  Your rehabilitation program will include the following services:  Physical Therapy (PT), Occupational Therapy (OT), Speech Therapy (ST), 24 hour per day rehabilitation nursing, Case Management (Social Worker), Rehabilitation Medicine, Nutrition Services and Pharmacy Services  Weekly team conferences will be held on Wednesday to discuss your progress.  Your Social Worker will talk with you frequently to get your input and to update you on team discussions.  Team conferences with you and your family in attendance may also be held.  Expected length of stay: 5-7 days  Overall anticipated outcome: supervision with Pt & OT and Mod assist with Speech  Depending on your progress and recovery, your program may change. Your Social Worker will coordinate services and will keep you informed of any changes. Your Social Worker's name and contact numbers are listed  below.  The following services may also be recommended but are not provided by the [facility] will be made to provide these services after discharge if needed.  Arrangements include referral to agencies that provide these services.  Your insurance has been verified to be:  [employer]-Medicare Your primary doctor is:  Fenton Malling  Pertinent information will be shared with your doctor and your insurance company.  Social  Worker:  Ovidio Kin, [place] or ([phone]  Information discussed with and copy given to patient by: Elease Hashimoto, 01/19/2018, 12:37 PM

## 2018-01-20 ENCOUNTER — Inpatient Hospital Stay (HOSPITAL_COMMUNITY): Payer: Medicare Other

## 2018-01-20 ENCOUNTER — Inpatient Hospital Stay (HOSPITAL_COMMUNITY): Payer: Medicare Other | Admitting: Physical Therapy

## 2018-01-20 ENCOUNTER — Inpatient Hospital Stay (HOSPITAL_COMMUNITY): Payer: Medicare Other | Admitting: Speech Pathology

## 2018-01-20 NOTE — Progress Notes (Signed)
Social Work Patient ID: Courtney Willis, female   DOB: [birth year], 83 y.o.   MRN: [id number] Team feels pt would be ready for discharge possibly Tuesday 12/24. Contacted daughter who is in her room with boyfriend and granddaughter and all not comfortable with this and feels she needs a longer length of stay then 3-4 days. Have asked family to attend afternoon therapies with pt, she has speech and physical therapies to see her function. Aware pt will need 24 hr care due to speech deficits and team recommend OP therapies. See how observing therapies go.

## 2018-01-20 NOTE — Progress Notes (Signed)
Social Work Patient ID: Courtney Willis, female   DOB: 1934/11/28, 83 y.o.   MRN: [id number] Spoke with daughter who went through speech therapy with Mom and discussed need to find out form [facility] the options of going back to independent  living or moving to another level of care or if family can stay with her in independent living. She plans to talk to them on Monday. Will re-group on Monday family may need until Thursday to get arrangements made for pt and her care. Will check with on Monday.

## 2018-01-20 NOTE — IPOC Note (Signed)
Overall Plan of Care [facility]) Patient Details Name: Courtney Willis MRN: [id number] DOB: 1934/04/27  Admitting Diagnosis: <principal problem not specified>  Hospital Problems: Active Problems:   SAH (subarachnoid hemorrhage) (HCC)   Intraparenchymal hemorrhage of brain (HCC)   Dyslipidemia   Hyponatremia     Functional Problem List: Nursing Behavior, Bladder, Endurance, Sensory, Safety, Perception, Medication Management, Motor, Nutrition, Pain, Skin Integrity, Bowel  PT Balance, Behavior, Edema, Endurance, Motor, Perception, Safety  OT Balance, Endurance, Behavior, Safety, Motor, Cognition  SLP Cognition, Linguistic  TR         Basic ADL's: OT Eating, Grooming, Bathing, Dressing, Toileting     Advanced  ADL's: OT Light Housekeeping     Transfers: PT Bed Mobility, Bed to Chair, Car, [employer], Futures trader, Tub/Shower     Locomotion: PT Stairs, Ambulation     Additional Impairments: OT Fuctional Use of Upper Extremity  SLP Communication, Social Cognition comprehension, expression Problem Solving, Attention, Awareness, Memory  TR      Anticipated Outcomes Item Anticipated Outcome  Self Feeding mod I  Swallowing      Basic self-care  (S)  Toileting  (S)   Bathroom Transfers (S)  Bowel/Bladder  Pt will manage bowel and bladder with mod assist while in rehab.   Transfers  supervision  Locomotion  supervision community gait w/ LRAD  Communication  mod assist   Cognition  mod assist   Pain  Pt will manage pain at 3 or less on a scale of 0-10.   Safety/Judgment  Pt will remain free of falls and injury with min assist    Therapy Plan: PT Intensity: Minimum of 1-2 x/day ,45 to 90 minutes PT Frequency: 5 out of 7 days PT Duration Estimated Length of Stay: 5-7 days OT Intensity: Minimum of 1-2 x/day, 45 to 90 minutes OT Frequency: 5 out of 7 days OT Duration/Estimated Length of Stay: 5-7 days SLP Intensity: Minumum of 1-2 x/day, 30 to 90 minutes SLP  Frequency: 3 to 5 out of 7 days SLP Duration/Estimated Length of Stay: 5-7 days     Team Interventions: Nursing Interventions Patient/Family Education, Bladder Management, Bowel Management, Disease Management/Prevention, Medication Management, Pain Management, Skin Care/Wound Management, Discharge Planning, Cognitive Remediation/Compensation, Dysphagia/Aspiration Precaution Training  PT interventions Ambulation/gait training, Community reintegration, DME/adaptive equipment instruction, Neuromuscular re-education, IT trainer, Psychosocial support, UE/LE Strength taining/ROM, Wheelchair propulsion/positioning, UE/LE Coordination activities, Therapeutic Activities, Skin care/wound management, Pain management, Functional electrical stimulation, Discharge planning, Training and development officer, Cognitive remediation/compensation, Disease management/prevention, Functional mobility training, Patient/family education, Splinting/orthotics, Therapeutic Exercise, Visual/perceptual remediation/compensation  OT Interventions Training and development officer, Cognitive remediation/compensation, Community reintegration, Discharge planning, Disease mangement/prevention, DME/adaptive equipment instruction, Functional mobility training, Neuromuscular re-education, Patient/family education, Pain management, Psychosocial support, Self Care/advanced ADL retraining, Therapeutic Activities, Therapeutic Exercise, UE/LE Strength taining/ROM, UE/LE Coordination activities, Visual/perceptual remediation/compensation  SLP Interventions Environmental controls, Functional tasks, Internal/external aids, Patient/family education, Cognitive remediation/compensation, Cueing hierarchy  TR Interventions    SW/CM Interventions Discharge Planning, Patient/Family Education, Psychosocial Support   Barriers to Discharge MD  Medical stability and receptive language deficits  Nursing      PT Behavior impulsivity, poor frustration tolerance  OT       SLP      SW       Team Discharge Planning: Destination: PT-Home(independent living) ,OT- (TBD- how much assistance ILF can provide) , SLP-Home Projected Follow-up: PT-Outpatient PT, OT-  Home health OT, SLP-Outpatient SLP, 24 hour supervision/assistance Projected Equipment Needs: PT-To be determined, OT- To be determined, SLP-None recommended by  SLP Equipment Details: PT- , OT-  Patient/family involved in discharge planning: PT- Patient, Family member/caregiver,  OT-Patient, SLP-Family member/caregiver, Patient  MD ELOS: 12-16d Medical Rehab Prognosis:  Excellent Assessment:  83 year old right-handed female with history of hyperlipidemia and tobacco abuse. History taken from chart review and family. Presented 01/13/2018 with aphasia. Per report patient lives alone independent living facility/[facility] prior to admission and very active. One level home with level entry. Patient was found by friend wandering about the house half dressed. CT of the head reviewed, showing left frontal lobe hemorrhage.Per report, acute intracranial hemorrhage in the anterior superior frontal gyrus with extension into the left subarachnoid space. Surrounding edema and regional mass effect with a 4-5 millimeter rightward midline shift. Patient did not receive TPA secondary to hemorrhage. MRI the brain showed a large left frontal lobe intraparenchymal hemorrhage and unchanged 4 mm rightward midline shift. CT angiogram of head and neck with no significant stenosis or large vessel occlusion. Latest follow-up cranial CT scan 01/15/2018 stable. Echocardiogram with ejection fraction of 94% grade 1 diastolic dysfunction. Patient is tolerating a regular diet   Now requiring 24/7 Rehab RN,MD, as well as CIR level PT, OT and SLP.  Treatment team will focus on ADLs and mobility with goals set at Sup See Team Conference Notes for weekly updates to the plan of care

## 2018-01-20 NOTE — Progress Notes (Signed)
Physical Therapy Session Note  Patient Details  Name: Courtney Willis MRN: [id number] Date of Birth: [birth year]  Today's Date: 01/20/2018 PT Individual Time: 1530-1630 PT Individual Time Calculation (min): 60 min   Short Term Goals: Week 1:  PT Short Term Goal 1 (Week 1): =LTGs due to ELOS  Skilled Therapeutic Interventions/Progress Updates:   Pt in supine and agreeable to therapy, no evidence of c/o pain throughout session. Ambulated around unit w/ CGA, CGA primarily needed for guiding, no AD used. NuStep 7 min @ level 3 for LE strengthening and endurance training. Worked on standing balance strategies and cognitive remediation remainder of session. Stood on foam surface for 3-4 min at a time while performing functional towel folding task w/ forced use of RUE and word-finding games. CGA to close supervision w/ tactile and verbal cues for RUE fine motor use and verbal cues for word-finding. Pt utilizing ankle balance strategies on foam surface, tactile cues for hip balance strategies. Returned to room and ended session in supine, all needs in reach.   Therapy Documentation Precautions:  Precautions Precautions: Fall, Other (comment) Precaution Comments: Impulsive Restrictions Weight Bearing Restrictions: No Vital Signs: Therapy Vitals Temp: 98.6 F (37 C) Temp Source: Oral Pulse Rate: 91 Resp: 16 BP: 126/64 Patient Position (if appropriate): Sitting Oxygen Therapy SpO2: 99 % Pain: Pain Assessment Pain Scale: 0-10 Pain Score: 0-No pain  Therapy/Group: Individual Therapy  Pippa Hanif Clent Demark 01/20/2018, 4:44 PM

## 2018-01-20 NOTE — Progress Notes (Signed)
Occupational Therapy Session Note  Patient Details  Name: Courtney Willis MRN: [id number] Date of Birth: 1934-07-27  Today's Date: 01/20/2018 OT Individual Time: 7185-5015 OT Individual Time Calculation (min): 42 min    Short Term Goals: Week 1:  OT Short Term Goal 1 (Week 1): STG=LTG d/t ELOS  Skilled Therapeutic Interventions/Progress Updates:    1;1. Pt received in bed and completes stand pivot transfers throughout session with CGA HHA and VC for attention to RUE. Pt completes toileting with A to complete clothing management after toileting with HOH A of RUE. Pt stands at sink to wash hands/brush teeth with VC for sequencing throughout task. Pt hangs small ornaments on mini christmas tree for R attention/forced use of RUE with MOD A. Pt demo difficulty manipulating/rotating ornaments to hook onto tree. Pt uses RUE with mod HOH A to wipe table of debri from fake chrismas tree into trash can on R side. Exited session with pt seated in bed, exit alarm on and call light in reach  Therapy Documentation Precautions:  Precautions Precautions: Fall, Other (comment) Precaution Comments: Impulsive Restrictions Weight Bearing Restrictions: No General:   Vital Signs:   Pain: Pain Assessment Pain Scale: 0-10 Pain Score: 0-No pain ADL:   Vision   Perception    Praxis   Exercises:   Other Treatments:     Therapy/Group: Individual Therapy  Tonny Branch 01/20/2018, 10:12 AM

## 2018-01-20 NOTE — Progress Notes (Signed)
Speech Language Pathology Daily Session Note  Patient Details  Name: Courtney Willis MRN: [id number] Date of Birth: 12-13-1934  Today's Date: 01/20/2018 SLP Individual Time: 36-1530 SLP Individual Time Calculation (min): 55 min  Short Term Goals: Week 1: SLP Short Term Goal 1 (Week 1): STG=LTG due to ELOS  Skilled Therapeutic Interventions:  Pt was seen for skilled ST targeting communication goals.  Upon arrival, pt's daughter and son as well as other family members were present.  Daughter presented with concerns about anticipated length of stay and potential discharge date of 12/24.  Daughter expressed valid concerns about coordinating 24/7 supervision at pt's ILF as she has been unable to get in touch with someone from facility to verify whether they will allow caregivers to stay with pt.  All family members continue to voice their commitment to providing pt with recommended level of care post discharge but feel they need more time to prepare.  SLP validated their concerns and briefly discussed pt's current limitations. SLP also relayed concerns to pt's primary PT and CSW.  Will plan to re-evaluate Monday for extension to length of stay.  Pt's family had to leave after discussion and SLP facilitated the session with ongoing diagnostic treatment of cognitive-linguistic function.  Pt initially needed max cues to match word to object from a field of three due to perseveration and echolalia; however, once she became more familiar with task structure therapist was able to fade cues to supervision for 100% accuracy.  Pt needed mod cues to describe actions and objects from photographs at the phrase level due to echolalia.  Pt was returned to room and left in bed with bed alarm set and call bell within reach.  Continue per current plan of care.    Pain Pain Assessment Pain Scale: 0-10 Pain Score: 0-No pain  Therapy/Group: Individual Therapy  Graelyn Bihl, Selinda Orion 01/20/2018, 3:59 PM

## 2018-01-20 NOTE — Progress Notes (Signed)
[facility] PHYSICAL MEDICINE & REHABILITATION PROGRESS NOTE   Subjective/Complaints:  Patient remains aphasic perseverates on yes Review of systems cannot obtain secondary to aphasia Objective:   No results found. Recent Labs    01/18/18 0434 01/19/18 0535  WBC 6.2 5.2  HGB 11.2* 11.9*  HCT 35.1* 36.1  PLT 241 255   Recent Labs    01/18/18 0434 01/19/18 0535  NA 134* 138  K 3.5 3.8  CL 101 102  CO2 24 26  GLUCOSE 106* 101*  BUN 13 11  CREATININE 0.62 0.80  CALCIUM 8.6* 8.9    Intake/Output Summary (Last 24 hours) at 01/20/2018 1232 Last data filed at 01/19/2018 1812 Gross per 24 hour  Intake 600 ml  Output -  Net 600 ml     Physical Exam: Vital Signs Blood pressure 92/79, pulse 79, temperature 98.2 F (36.8 C), resp. rate 17, weight 62.1 kg, SpO2 97 %.  General: No acute distress Mood and affect are appropriate Heart: Regular rate and rhythm no rubs murmurs or extra sounds Lungs: Clear to auscultation, breathing unlabored, no rales or wheezes Abdomen: Positive bowel sounds, soft nontender to palpation, nondistended Extremities: No clubbing, cyanosis, or edema Skin: No evidence of breakdown, no evidence of rash Neurologic: Cranial nerves II through XII intact, motor strength is 5/5 in left and 4/5 Right deltoid, bicep, tricep, grip, hip flexor, knee extensors, ankle dorsiflexor and plantar flexor Sensory exam reports equal light touch  bilateral upper extremities  Musculoskeletal: Full range of motion in all 4 extremities. No joint swelling    Assessment/Plan: 1. Functional deficits secondary to Left frontal lobe IPH with apraxia , fluent aphasia and visual agnosia , right  which require 3+ hours per day of interdisciplinary therapy in a comprehensive inpatient rehab setting.  Physiatrist is providing close team supervision and 24 hour management of active medical problems listed below.  Physiatrist and rehab team continue to assess barriers to  discharge/monitor patient progress toward functional and medical goals  Care Tool:  Bathing    Body parts bathed by patient: Right arm, Left arm, Chest, Abdomen, Front perineal area, Buttocks, Right upper leg, Right lower leg, Left upper leg, Left lower leg, Face         Bathing assist Assist Level: Contact Guard/Touching assist     Upper Body Dressing/Undressing Upper body dressing   What is the patient wearing?: Pull over shirt    Upper body assist Assist Level: Maximal Assistance - Patient 25 - 49%    Lower Body Dressing/Undressing Lower body dressing      What is the patient wearing?: Underwear/pull up, Pants     Lower body assist Assist for lower body dressing: Maximal Assistance - Patient 25 - 49%     Toileting Toileting    Toileting assist Assist for toileting: Supervision/Verbal cueing     Transfers Chair/bed transfer  Transfers assist     Chair/bed transfer assist level: Supervision/Verbal cueing     Locomotion Ambulation   Ambulation assist      Assist level: Contact Guard/Touching assist Assistive device: Hand held assist Max distance: 150'   Walk 10 feet activity   Assist     Assist level: Contact Guard/Touching assist Assistive device: Hand held assist   Walk 50 feet activity   Assist    Assist level: Contact Guard/Touching assist Assistive device: Hand held assist    Walk 150 feet activity   Assist    Assist level: Contact Guard/Touching assist Assistive device: Hand held assist  Walk 10 feet on uneven surface  activity   Assist Walk 10 feet on uneven surfaces activity did not occur: Safety/medical concerns         Wheelchair     Assist Will patient use wheelchair at discharge?: No   Wheelchair activity did not occur: N/A         Wheelchair 50 feet with 2 turns activity    Assist    Wheelchair 50 feet with 2 turns activity did not occur: N/A       Wheelchair 150 feet activity      Assist Wheelchair 150 feet activity did not occur: N/A        Medical Problem List and Plan: 1.   Decreased functional mobility with aphasia secondary to  Left frontal lobe intraparenchymal hemorrhage/SAH likely due to CAA( cerebral amyloid angiopathy) CIR PT OT speech, patient will make quick improvements with her mobility and self-care however her aphasia will be longer-term issue 2.  DVT Prophylaxis/Anticoagulation: SCDs. 3. Pain Management:  Tylenol as needed 4. Mood:  Provide emotional support 5. Neuropsych: This patient is capable of making decisions on her own behalf. 6. Skin/Wound Care:  Routine skin checks 7. Fluids/Electrolytes/Nutrition:  Routine in and out's , BUN/Cr normal 01/19/2018, fluid intake 840 mL yesterday 8. Hyperlipidemia. Begin Pravachol 20 mg daily 14.  Hyponatremia: Resolved  LOS: 2 days A FACE TO FACE EVALUATION WAS PERFORMED  Charlett Blake 01/20/2018, 12:32 PM

## 2018-01-20 NOTE — Progress Notes (Signed)
Occupational Therapy Session Note  Patient Details  Name: Courtney Willis MRN: [id number] Date of Birth: [birth year]  Today's Date: 01/20/2018 OT Individual Time: 1100-1200 OT Individual Time Calculation (min): 60 min    Short Term Goals: Week 1:  OT Short Term Goal 1 (Week 1): STG=LTG d/t ELOS  Skilled Therapeutic Interventions/Progress Updates:    Pt resting in bed upon arrival and agreeable to participating in therapy. OT intervention with focus on functional amb with HHA, attention to R, forced used of RUE/RUE NMR, task initiation, following one step commands.  Pt amb with HHA to gym and engaged in several table tasks with focus on forced use of RUE and following one step commands.  Activities included simple peg board pattern, folding wash cloths, removing/replacing caps on medicine bottles, and writing name.  Pt requires mod multimodal cues/demonstration cues for folding wash cloths and medicine bottle task.  Pt amb in hallway to sequentially gather numbered discs placed on R/L. Pt required min verbal cues to complete task.  Pt returned to room and remained in recliner with belt alarm activated and family present.   Therapy Documentation Precautions:  Precautions Precautions: Fall, Other (comment) Precaution Comments: Impulsive Restrictions Weight Bearing Restrictions: No   Pain: Pain Assessment Pain Scale: 0-10 Pain Score: 0-No pain   Other Treatments: Treatments Neuromuscular Facilitation: Right;Upper Extremity;Forced use;Activity to increase coordination;Activity to increase timing and sequencing   Therapy/Group: Individual Therapy  Leroy Libman 01/20/2018, 12:07 PM

## 2018-01-21 ENCOUNTER — Inpatient Hospital Stay (HOSPITAL_COMMUNITY): Payer: Medicare Other | Admitting: Speech Pathology

## 2018-01-21 ENCOUNTER — Inpatient Hospital Stay (HOSPITAL_COMMUNITY): Payer: Medicare Other

## 2018-01-21 ENCOUNTER — Inpatient Hospital Stay (HOSPITAL_COMMUNITY): Payer: Medicare Other | Admitting: Physical Therapy

## 2018-01-21 NOTE — Progress Notes (Signed)
Occupational Therapy Session Note  Patient Details  Name: Courtney Willis MRN: [id number] Date of Birth: 17-Jan-1935  Today's Date: 01/21/2018 OT Concurrent Time: 1100-1200 OT Concurrent Time Calculation (min): 60 min   Short Term Goals: Week 1:  OT Short Term Goal 1 (Week 1): STG=LTG d/t ELOS  Skilled Therapeutic Interventions/Progress Updates:    pt received from SLP. Focus of session on forced RUE use to manipulate blocks and scrabble pieces for improved FMC, R attention and BUE task coordination. Pt turns over, reaches and places blocks and scrabble pieces with RUE and pt seated on L hand for forced RUE use. Pt provided with HOH A to open/spread out newspaper, rip news paper and wad up into balls with RUE and throw away in garbage can on R side for R attention with mod demonstration cues for BUE task/coordination and R gross grasp. Pt returned to room and set up with lunch seated in recliner and belt alarm on  Therapy Documentation Precautions:  Precautions Precautions: Fall, Other (comment) Precaution Comments: Impulsive Restrictions Weight Bearing Restrictions: No General:   Vital Signs:   Pain: Pain Assessment Pain Scale: 0-10 Pain Score: 0-No pain ADL:   Vision   Perception    Praxis   Exercises:   Other Treatments:     Therapy/Group: concurrent tx  Tonny Branch 01/21/2018, 12:25 PM

## 2018-01-21 NOTE — Progress Notes (Signed)
Speech Language Pathology Daily Session Note  Patient Details  Name: Courtney Willis MRN: [id number] Date of Birth: November 21, 1934  Today's Date: 01/21/2018 SLP Individual Time: 1005-1101 SLP Individual Time Calculation (min): 56 min  Short Term Goals: Week 1: SLP Short Term Goal 1 (Week 1): STG=LTG due to ELOS  Skilled Therapeutic Interventions:  Pt was seen for skilled ST targeting communication goals.  Pt was received in bed, awake, alert, and agreeable to participating in therapy.  SLP facilitated the session with picture sequencing cards to address problem solving and verbal expression goals.  Pt was able to sequence 4 step picture cards with supervision assist after becoming familiar with task structure.  Pt then described actions and objects in pictures at the phrase and sentence level with mod assist multimodal cues to correct echolalic and perseverative behaviors.  SLP also facilitated the session with open ended questions to continue to challenge expansion of utterances.  Pt was able to generate a response to an open ended question with max assist multimodal cues.  Pt was returned to room and left in recliner with chair alarm set.  Continue per current plan of care.    Pain Pain Assessment Pain Scale: 0-10 Pain Score: 0-No pain  Therapy/Group: Individual Therapy  Garlin Batdorf, Selinda Orion 01/21/2018, 11:02 AM

## 2018-01-21 NOTE — Progress Notes (Signed)
Courtney Willis is a 83 y.o. female admitted for CIR following a left frontal lobe hemorrhage with functional disability secondary to a aphasia and right-sided weakness  Past Medical History:  Diagnosis Date  . Acid reflux 02/26/2008  . Arthritis   . 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 Avitaminosis D 08/12/2014  . Bowel disease 02/16/2008  . Bunion 08/12/2014  . Cancer [facility])    Skin cancer- basal- upper arm , upper chest  . CN (constipation) 08/12/2014  . DD (diverticular disease) 02/16/2008  . Elevated liver enzymes 08/12/2014  . Fibroids, submucosal 08/12/2014   of her lower lip   . Hypercholesteremia 08/12/2014  . 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 Post menopausal syndrome 08/12/2014     Subjective: No new complaints. No new problems.  Objective: Vital signs in last 24 hours: Temp:  [98 F (36.7 C)-98.6 F (37 C)] 98.5 F (36.9 C) (12/21 0524) Pulse Rate:  [72-91] 72 (12/21 0524) Resp:  [16-18] 16 (12/21 0524) BP: (126-137)/(64-79) 131/70 (12/21 0524) SpO2:  [98 %-99 %] 99 % (12/21 0524) Weight change:  Last BM Date: 01/18/18  Intake/Output from previous day: 12/20 0701 - 12/21 0700 In: 220 [P.O.:220] Out: -   Patient Vitals for the past 24 hrs:  BP Temp Temp src Pulse Resp SpO2  01/21/18 0524 131/70 98.5 F (36.9 C) Oral 72 16 99 %  01/20/18 2010 137/79 98 F (36.7 C) Oral 81 18 98 %  01/20/18 1457 126/64 98.6 F (37 C) Oral 91 16 99 %     Physical Exam General: No apparent distress   HEENT: not dry Lungs: Normal effort. Lungs clear to auscultation, no crackles or wheezes. Cardiovascular: Regular rate and rhythm, no edema Abdomen: S/NT/ND; BS(+) Musculoskeletal:  unchanged Neurological: No new neurological deficits  Wounds: N/A    Skin: clear  Mental state: Alert, oriented, cooperative    Lab Results: BMET    Component Value Date/Time   NA 138 01/19/2018 0535   NA 141 07/20/2017 1059   K 3.8 01/19/2018 0535   CL 102 01/19/2018 0535   CO2 26 01/19/2018 0535   GLUCOSE 101 (H) 01/19/2018 0535   BUN 11 01/19/2018 0535   BUN 14 07/20/2017 1059   CREATININE 0.80 01/19/2018 0535   CALCIUM 8.9 01/19/2018 0535   GFRNONAA >60 01/19/2018 0535   GFRAA >60 01/19/2018 0535   CBC    Component Value Date/Time   WBC 5.2 01/19/2018 0535   RBC 3.82 (L) 01/19/2018 0535   HGB 11.9 (L) 01/19/2018 0535   HGB 12.9 07/20/2017 1059   HCT 36.1 01/19/2018 0535   HCT 39.9 07/20/2017 1059   PLT 255 01/19/2018 0535   PLT 299 07/20/2017 1059   MCV 94.5 01/19/2018 0535   MCV 93 07/20/2017 1059   MCH 31.2 01/19/2018 0535   MCHC 33.0 01/19/2018 0535   RDW 12.3 01/19/2018 0535   RDW 13.4 07/20/2017 1059   LYMPHSABS 1.1 01/19/2018 0535   LYMPHSABS 1.5 07/20/2017 1059   MONOABS 0.6 01/19/2018 0535   EOSABS 0.1 01/19/2018 0535   EOSABS 0.0 07/20/2017 1059   BASOSABS 0.0 01/19/2018 0535   BASOSABS 0.0 07/20/2017 1059    Medications: I have reviewed the patient's current medications.  Assessment/Plan:  Status post left frontal IPH with functional deficits.  Continue CIR DVT prophylaxis continue SCDs Dyslipidemia continue pravastatin    Length of stay, days: 3  Marletta Lor , MD 01/21/2018, 12:18 PM

## 2018-01-21 NOTE — Progress Notes (Signed)
Physical Therapy Session Note  Patient Details  Name: Courtney Willis MRN: [id number] Date of Birth: 1934-10-01  Today's Date: 01/21/2018 PT Individual Time: 1340-1445 PT Individual Time Calculation (min): 65 min   Short Term Goals: Week 1:  PT Short Term Goal 1 (Week 1): =LTGs due to ELOS  Skilled Therapeutic Interventions/Progress Updates:   Pt in supine and agreeable to therapy, no c/o pain. Ambulated around unit w/ CGA to close supervision, CGA needed for guiding and to attend to R environment. She continues to ambulate at a very slow and cautious gait speed. NuStep 7 min @ level 4 for LE strengthening and endurance training. Performed dynamic standing and gait tasks requiring pt to scan environment, perform dynamic UE reaching, problem solving, and motor planning. CGA overall for dynamic tasks. Verbal and visual cues for motor planning and problem solving. Tasks included collect bean bags in hallway, put together pipe tree, vertical puzzle, dynavision, and play life-size connect 4. Returned to room and ended session in supine, all needs in reach.   Therapy Documentation Precautions:  Precautions Precautions: Fall, Other (comment) Precaution Comments: Impulsive Restrictions Weight Bearing Restrictions: No Pain:    Therapy/Group: Individual Therapy  Loye Vento Clent Demark 01/21/2018, 3:19 PM

## 2018-01-22 ENCOUNTER — Inpatient Hospital Stay (HOSPITAL_COMMUNITY): Payer: Medicare Other | Admitting: Occupational Therapy

## 2018-01-22 NOTE — Progress Notes (Signed)
Courtney Willis is a 83 y.o. female admitted for CIR with a aphasia and right-sided weakness following a left frontal hemorrhagic stroke  Past Medical History:  Diagnosis Date  . Acid reflux 02/26/2008  . Arthritis   . 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 Avitaminosis D 08/12/2014  . Bowel disease 02/16/2008  . Bunion 08/12/2014  . Cancer [facility])    Skin cancer- basal- upper arm , upper chest  . CN (constipation) 08/12/2014  . DD (diverticular disease) 02/16/2008  . Elevated liver enzymes 08/12/2014  . Fibroids, submucosal 08/12/2014   of her lower lip   . Hypercholesteremia 08/12/2014  . 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 Post menopausal syndrome 08/12/2014     Subjective: No new complaints. No new problems. Slept well.   Objective: Vital signs in last 24 hours: Temp:  [97.9 F (36.6 C)-99 F (37.2 C)] 97.9 F (36.6 C) (12/22 0609) Pulse Rate:  [72-85] 72 (12/22 0609) Resp:  [14-18] 14 (12/22 0609) BP: (119-130)/(63-75) 124/67 (12/22 0609) SpO2:  [96 %-98 %] 97 % (12/22 0609) Weight change:  Last BM Date: 01/20/18  Intake/Output from previous day: 12/21 0701 - 12/22 0700 In: 240 [P.O.:240] Out: -    Patient Vitals for the past 24 hrs:  BP Temp Temp src Pulse Resp SpO2  01/22/18 0609 124/67 97.9 F (36.6 C) - 72 14 97 %  01/21/18 2030 119/75 99 F (37.2 C) Oral 85 17 98 %  01/21/18 1535 130/63 98.6 F (37 C) Oral 73 18 96 %     Physical Exam General: No apparent distress   HEENT: not dry Lungs: Normal effort. Lungs clear to auscultation, no crackles or wheezes. Cardiovascular: Regular rate and rhythm, no edema Abdomen: S/NT/ND; BS(+) Musculoskeletal:  unchanged Neurological: No new neurological deficits with expressive aphasia and right-sided weakness Wounds: N/A    Skin: No rash Mental state: Alert, oriented, cooperative    Lab Results: BMET    Component Value Date/Time   NA 138 01/19/2018 0535   NA 141 07/20/2017 1059   K 3.8 01/19/2018 0535   CL 102 01/19/2018 0535   CO2 26 01/19/2018 0535   GLUCOSE 101 (H) 01/19/2018 0535   BUN 11 01/19/2018 0535   BUN 14 07/20/2017 1059   CREATININE 0.80 01/19/2018 0535   CALCIUM 8.9 01/19/2018 0535   GFRNONAA >60 01/19/2018 0535   GFRAA >60 01/19/2018 0535   CBC    Component Value Date/Time   WBC 5.2 01/19/2018 0535   RBC 3.82 (L) 01/19/2018 0535   HGB 11.9 (L) 01/19/2018 0535   HGB 12.9 07/20/2017 1059   HCT 36.1 01/19/2018 0535   HCT 39.9 07/20/2017 1059   PLT 255 01/19/2018 0535   PLT 299 07/20/2017 1059   MCV 94.5 01/19/2018 0535   MCV 93 07/20/2017 1059   MCH 31.2 01/19/2018 0535   MCHC 33.0 01/19/2018 0535   RDW 12.3 01/19/2018 0535   RDW 13.4 07/20/2017 1059   LYMPHSABS 1.1 01/19/2018 0535   LYMPHSABS 1.5 07/20/2017 1059   MONOABS 0.6 01/19/2018 0535   EOSABS 0.1 01/19/2018 0535   EOSABS 0.0 07/20/2017 1059   BASOSABS 0.0 01/19/2018 0535   BASOSABS 0.0 07/20/2017 1059     Medications: I have reviewed the patient's current medications.  Assessment/Plan:  Functional deficits with expressive aphasia and right-sided weakness following left frontal IPH Dyslipidemia continue pravastatin DVT prophylaxis continue SCDs    Length of stay, days: 4  Marletta Lor , MD 01/22/2018, 10:39 AM

## 2018-01-22 NOTE — Progress Notes (Signed)
Occupational Therapy Session Note  Patient Details  Name: Courtney Willis MRN: [id number] Date of Birth: January 10, 1935  Today's Date: 01/22/2018 OT Group Time: 1101-1201 OT Group Time Calculation (min): 60 min  Skilled Therapeutic Interventions/Progress Updates:    Pt engaged in therapeutic w/c level dance group focusing on patient choice, UE/LE strengthening, salience, activity tolerance, and social participation. Pt was guided through various dance-based exercises involving UEs/LEs and trunk. All music was selected by group members. Emphasis placed on standing balance and Rt NMR. Pt stood during two songs to dance with steady assist. Pt affect visibly brightened when group members clapped and cheered for her. Pt integrating Rt side with cuing and during bilaterally involved exercises. At end of session she ambulated back to room with NT.   Therapy Documentation Precautions:  Precautions Precautions: Fall, Other (comment) Precaution Comments: Impulsive Restrictions Weight Bearing Restrictions: No Pain: No s/s pain during tx    ADL:     Therapy/Group: Group Therapy  Immaculate Crutcher A Buffi Ewton 01/22/2018, 12:44 PM

## 2018-01-22 NOTE — Plan of Care (Signed)
  Problem: RH BOWEL ELIMINATION Goal: RH STG MANAGE BOWEL WITH ASSISTANCE Description STG Manage Bowel with Mod Assistance.  Outcome: Not Progressing; Constipation LBM 12/20; laxatives given Problem: RH SAFETY Goal: RH STG ADHERE TO SAFETY PRECAUTIONS W/ASSISTANCE/DEVICE Description STG Adhere to Safety Precautions With Min Assistance/Device.  Outcome:  not Progressing; impulsive ; telesitter     Problem: RH SKIN INTEGRITY Goal: RH STG SKIN FREE OF INFECTION/BREAKDOWN Description No new breakdown with min assist    Outcome: progressing

## 2018-01-23 ENCOUNTER — Inpatient Hospital Stay (HOSPITAL_COMMUNITY): Payer: Medicare Other

## 2018-01-23 ENCOUNTER — Inpatient Hospital Stay (HOSPITAL_COMMUNITY): Payer: Medicare Other | Admitting: Occupational Therapy

## 2018-01-23 NOTE — Progress Notes (Signed)
Social Work Patient ID: Courtney Willis, female   DOB: 11/29/1934, 83 y.o.   MRN: [id number] Spoke with daughter-Janet who reports they have a meeting with [facility] today and hopefully will answer the questions they have and tell them the options she has. Daughter to let me know how the meeting goes.

## 2018-01-23 NOTE — Progress Notes (Signed)
Occupational Therapy Session Note  Patient Details  Name: Courtney Willis MRN: [id number] Date of Birth: 1934/05/17  Today's Date: 01/23/2018 OT Individual Time: 1300-1355 OT Individual Time Calculation (min): 55 min    Short Term Goals: Week 1:  OT Short Term Goal 1 (Week 1): STG=LTG d/t ELOS  Skilled Therapeutic Interventions/Progress Updates:    Pt resting in recliner upon arrival.  Pt acknowledged that she needed to use the toilet and amb with supervision to bathroom to use toilet.  Pt completed toileting tasks with close supervision.  Pt required mod verbal cues to wash hands and use paper towels for drying.  OT intervention with focus on forced used of RUE in functional tasks and following one step commands.  Pt requires max multimodal cues to use RUE for variety of tasks including folding towels, assembling pvc pipe structure, using peg board, carrying large item to another room, tossing ball, writing name. Pt requires min demonstrational cues to follow one step commands for functional tasks.  Pt returned to room and remained in recliner with belt alarm activated and all needs within reach.   Therapy Documentation Precautions:  Precautions Precautions: Fall, Other (comment) Precaution Comments: Impulsive Restrictions Weight Bearing Restrictions: No   Pain: Pain Assessment Pain Scale: 0-10 Pain Score: 0-No pain   Other Treatments: Treatments Neuromuscular Facilitation: Right;Forced use;Activity to increase coordination;Activity to increase motor control;Activity to increase timing and sequencing;Activity to increase grading   Therapy/Group: Individual Therapy  Leroy Libman 01/23/2018, 1:58 PM

## 2018-01-23 NOTE — Progress Notes (Signed)
Speech Language Pathology Daily Session Note  Patient Details  Name: Courtney Willis MRN: [id number] Date of Birth: 1934/05/17  Today's Date: 01/23/2018 SLP Individual Time: 1500-1541 SLP Individual Time Calculation (min): 41 min  Short Term Goals: Week 1: SLP Short Term Goal 1 (Week 1): STG=LTG due to ELOS  Skilled Therapeutic Interventions:Skilled ST services focused on speech skills. SLP facilitated matching object in a field of three with open ended phrase pt demonstrated 7/10 accuracy with min A verbal cues and pt named object 9/10 opportunties. SLP facilitated expressive speech utilizing simple picture descriptions, originaly utilizing pictures with "problems" however pt perseverated on "corrected version", therefore given simple picture cards demonstarting a verbal pt demonstrated 90% accuracy in production of accurate phrase with mod A verbal cues. Pt expressed wants/needs, requesting "coffee" when asked if she wanted anything and was left with family for supervision of coffee. SLP provided education of cognitive needs pertaining to safety with hot beverage and family agreed to supervision. Pt was left in room with call bell within reach and bed alarm set. Recommend to continue skilled ST services.      Pain Pain Assessment Pain Scale: 0-10 Pain Score: 0-No pain  Therapy/Group: Individual Therapy  Nathanial Arrighi  [facility] 01/23/2018, 1:07 PM

## 2018-01-23 NOTE — Progress Notes (Signed)
Occupational Therapy Session Note  Patient Details  Name: Courtney Willis MRN: [id number] Date of Birth: [birth year]  Today's Date: 01/23/2018 OT Individual Time: 8315-1761 OT Individual Time Calculation (min): 25 min   Short Term Goals: Week 1:  OT Short Term Goal 1 (Week 1): STG=LTG d/t ELOS  Skilled Therapeutic Interventions/Progress Updates:    Pt greeted in bed with no s/s pain. When asked if she wanted to brush teeth, pt initiated getting OOB. She ambulated short distance to sink with steady assist to complete oral care and a variety of grooming tasks, including hand washing, lotion application, moisturizing face with personal products, coming hair, washing face, and flossing teeth. Tx focus on standing balance and Rt NMR. She required vcs and tactile cues to increase functional use of Rt. Support provided at Rt elbow when she reached towards mouth to floss teeth or wash face. HOH for integrating Rt as gross stabilizer throughout. Mod-Max vcs for motor planning and sequencing with pt often tapping faucet when water was turned off. HOH for mgt of faucet levers with Rt. Supervision for balance overall. At end of session pt ambulated back to bed and returned to semi reclined position. Pt left with all needs within reach and bed alarm set.   Pt with three medication bottles in personal cosmetics bag. Notified RN and showed her their location to confiscate.   Therapy Documentation Precautions:  Precautions Precautions: Fall, Other (comment) Precaution Comments: Impulsive Restrictions Weight Bearing Restrictions: No Vital Signs: Therapy Vitals Temp: 98.9 F (37.2 C) Temp Source: Oral Pulse Rate: 79 Resp: 17 BP: 114/67 Patient Position (if appropriate): Lying Oxygen Therapy SpO2: 97 % O2 Device: Room Air Pain: No s/s pain during tx    ADL:        Therapy/Group: Individual Therapy  Malorie Bigford A Gravity Carmack 01/23/2018, 9:31 AM

## 2018-01-23 NOTE — Progress Notes (Signed)
[facility] PHYSICAL MEDICINE & REHABILITATION PROGRESS NOTE   Subjective/Complaints:  Patient remains aphasic , appears comfortable in bed review of systems cannot obtain secondary to aphasia Objective:   No results found. No results for input(s): WBC, HGB, HCT, PLT in the last 72 hours. No results for input(s): NA, K, CL, CO2, GLUCOSE, BUN, CREATININE, CALCIUM in the last 72 hours.  Intake/Output Summary (Last 24 hours) at 01/23/2018 1633 Last data filed at 01/23/2018 1256 Gross per 24 hour  Intake 600 ml  Output -  Net 600 ml     Physical Exam: Vital Signs Blood pressure (!) 142/72, pulse 84, temperature 98.4 F (36.9 C), temperature source Oral, resp. rate 14, weight 62.1 kg, SpO2 98 %.  General: No acute distress Mood and affect are appropriate Heart: Regular rate and rhythm no rubs murmurs or extra sounds Lungs: Clear to auscultation, breathing unlabored, no rales or wheezes Abdomen: Positive bowel sounds, soft nontender to palpation, nondistended Extremities: No clubbing, cyanosis, or edema Skin: No evidence of breakdown, no evidence of rash Neurologic: Cranial nerves II through XII intact, motor strength is 5/5 in left and 4/5 Right deltoid, bicep, tricep, grip, hip flexor, knee extensors, ankle dorsiflexor and plantar flexor Sensory exam reports equal light touch  bilateral upper extremities  Musculoskeletal: Full range of motion in all 4 extremities. No joint swelling    Assessment/Plan: 1. Functional deficits secondary to Left frontal lobe IPH with apraxia , fluent aphasia and visual agnosia , right  which require 3+ hours per day of interdisciplinary therapy in a comprehensive inpatient rehab setting.  Physiatrist is providing close team supervision and 24 hour management of active medical problems listed below.  Physiatrist and rehab team continue to assess barriers to discharge/monitor patient progress toward functional and medical goals  Care  Tool:  Bathing    Body parts bathed by patient: Right arm, Left arm, Chest, Abdomen, Front perineal area, Buttocks, Right upper leg, Right lower leg, Left upper leg, Left lower leg, Face         Bathing assist Assist Level: Contact Guard/Touching assist     Upper Body Dressing/Undressing Upper body dressing   What is the patient wearing?: Pull over shirt    Upper body assist Assist Level: Maximal Assistance - Patient 25 - 49%    Lower Body Dressing/Undressing Lower body dressing      What is the patient wearing?: Underwear/pull up, Pants     Lower body assist Assist for lower body dressing: Maximal Assistance - Patient 25 - 49%     Toileting Toileting    Toileting assist Assist for toileting: Supervision/Verbal cueing     Transfers Chair/bed transfer  Transfers assist     Chair/bed transfer assist level: Contact Guard/Touching assist     Locomotion Ambulation   Ambulation assist      Assist level: Contact Guard/Touching assist Assistive device: Other (comment)(none) Max distance: 150'   Walk 10 feet activity   Assist     Assist level: Contact Guard/Touching assist Assistive device: Hand held assist   Walk 50 feet activity   Assist    Assist level: Contact Guard/Touching assist Assistive device: Hand held assist    Walk 150 feet activity   Assist Walk 150 feet activity did not occur: Safety/medical concerns(per report)  Assist level: Contact Guard/Touching assist Assistive device: Hand held assist    Walk 10 feet on uneven surface  activity   Assist Walk 10 feet on uneven surfaces activity did not occur: Safety/medical concerns  Wheelchair     Assist Will patient use wheelchair at discharge?: No   Wheelchair activity did not occur: N/A         Wheelchair 50 feet with 2 turns activity    Assist    Wheelchair 50 feet with 2 turns activity did not occur: N/A       Wheelchair 150 feet activity      Assist Wheelchair 150 feet activity did not occur: N/A        Medical Problem List and Plan: 1.   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  DVT Prophylaxis/Anticoagulation: SCDs. 3. Pain Management:  Tylenol as needed 4. Mood:  Provide emotional support 5. Neuropsych: This patient is capable of making decisions on her own behalf. 6. Skin/Wound Care:  Routine skin checks 7. Fluids/Electrolytes/Nutrition:  Routine in and out's , BUN/Cr normal 01/19/2018, fluid intake 120 mL recorded yesterday-recheck BMET in am 8. Hyperlipidemia. Begin Pravachol 20 mg daily 14.  hx Hyponatremia: Recheck BMET  LOS: 5 days A FACE TO FACE EVALUATION WAS PERFORMED  Charlett Blake 01/23/2018, 4:33 PM

## 2018-01-23 NOTE — Progress Notes (Addendum)
Physical Therapy Session Note  Patient Details  Name: Courtney Willis MRN: [id number] Date of Birth: [birth year]  Today's Date: 01/23/2018 PT Individual Time: 1100-1159 PT Individual Time Calculation (min): 59 min   Short Term Goals: Week 1:  PT Short Term Goal 1 (Week 1): =LTGs due to ELOS  Skilled Therapeutic Interventions/Progress Updates:    Pt supine in bed upon PT arrival, agreeable to therapy tx and denies pain. Pt transferred to sitting with supervision and ambulated from room>dayroom x 150 ft without AD, CGA and verbal cues for obstacle avoidance and attention to her R side. Pt used nustep x 7 minutes this session working on global strengthening and attention, workload 5. Pt ambulated to the gym x 150 ft with CGA, no AD. Pt worked on attention to R, problem solving and cognitive remediation to worked on cup stacking activity, putting numbered cups in order, sorting activity, and finding numbers/colors as called by therapist. Pt ambulated to rehab apartment with CGA, performed bed and couch transfer with CGA. Pt worked on R attention to perform scavenger hunt task in kitchen locating various items, mod assist for verbal cueing/cognition, supervision-CGA for standing balance. Pt ambulated back to room and left seated in recliner with needs in reach, chair alarm set.   Therapy Documentation Precautions:  Precautions Precautions: Fall, Other (comment) Precaution Comments: Impulsive Restrictions Weight Bearing Restrictions: No   Therapy/Group: Individual Therapy  Netta Corrigan, PT, DPT 01/23/2018, 7:53 AM

## 2018-01-24 ENCOUNTER — Inpatient Hospital Stay (HOSPITAL_COMMUNITY): Payer: Self-pay

## 2018-01-24 ENCOUNTER — Inpatient Hospital Stay (HOSPITAL_COMMUNITY): Payer: Medicare Other | Admitting: Speech Pathology

## 2018-01-24 ENCOUNTER — Inpatient Hospital Stay (HOSPITAL_COMMUNITY): Payer: Medicare Other | Admitting: Occupational Therapy

## 2018-01-24 LAB — BASIC METABOLIC PANEL
Anion gap: 10 (ref 5–15)
BUN: 13 mg/dL (ref 8–23)
CO2: 26 mmol/L (ref 22–32)
Calcium: 9.4 mg/dL (ref 8.9–10.3)
Chloride: 101 mmol/L (ref 98–111)
Creatinine, Ser: 0.78 mg/dL (ref 0.44–1.00)
GFR calc Af Amer: >60 mL/min (ref >60)
GFR calc non Af Amer: >60 mL/min (ref >60)
Glucose, Bld: 104 mg/dL — ABNORMAL HIGH (ref 70–99)
Potassium: 4 mmol/L (ref 3.5–5.1)
Sodium: 137 mmol/L (ref 135–145)

## 2018-01-24 NOTE — Progress Notes (Signed)
Physical Therapy Discharge Summary  Patient Details  Name: Courtney Willis MRN: [id number] Date of Birth: 03/21/1934  Today's Date: 01/26/2018 PT Individual Time: 0800-0840 AND 1328-1409 PT Individual Time Calculation (min): 40 min AND 41 min  Session 1:  Pt in supine and agreeable to therapy, denies pain. Set-up assist in propped supine to eat breakfast, verbal cues for swallowing, attention, and sequencing. Ambulated to/from toilet w/ supervision, pericare and LE garment management w/ supervision as well. Verbal cues for sequencing w/ hand hygiene at sink prior to leaving room. Ambulated to/from therapy gym, performed car transfer, and negotiated 12 steps w/ unilateral rail w/ supervision. No rest break, >500'. Verbal cues for way-finding and for safety. Returned to room and ended session in w/c, all needs in reach.   Session 2:  Pt in recliner and agreeable to therapy, no c/o pain throughout session. Ambulated around unit w/ supervision w/o AD, >150' at a time. Performed Berg Balance Scale as detailed below. Worked on gait in community environment w/ obstacles and people NuStep 10 min @ level 4 for global strengthen and endurance. Assisted pt w/ changing clothes as she has not changed out of her night clothes yet. Min manual assist overall for R inattention, mod-max verbal and visual cues for sequencing. Ended session in recliner, all needs in reach.   Patient has met 10 of 10 long term goals due to improved activity tolerance, improved balance, improved postural control, improved attention, improved awareness and improved coordination.  Patient to discharge at an ambulatory level Supervision.   Patient's care partner unavailable to provide the necessary physical and cognitive assistance at discharge. Pt w/ planned d/c to SNF portion of independent/assisted living facility.  Reasons goals not met: n/a  Recommendation:  Patient will benefit from ongoing skilled PT services in skilled nursing  facility setting to continue to advance safe functional mobility, address ongoing impairments in functional balance, cognition, endurance, and strength, and minimize fall risk.  Equipment: No equipment provided  Reasons for discharge: treatment goals met and discharge from hospital  Patient/family agrees with progress made and goals achieved: Yes  PT Discharge Precautions/Restrictions Precautions Precautions: Fall;Other (comment) Precaution Comments: Impulsive Restrictions Weight Bearing Restrictions: No Vital Signs Therapy Vitals Temp: 98.5 F (36.9 C) Temp Source: Oral Pulse Rate: 71 Resp: 18 BP: (!) 147/73 Patient Position (if appropriate): Sitting Oxygen Therapy SpO2: 99 % O2 Device: Room Air Vision/Perception  Vision - Assessment Additional Comments: R inattention, suspect true inattention vs visual impairements Perception Perception: Impaired Inattention/Neglect: Does not attend to right side of body;Does not attend to right visual field Praxis Praxis: Impaired Praxis Impairment Details: Motor planning;Perseveration;Ideomotor;Initiation;Ideation  Cognition Overall Cognitive Status: Impaired/Different from baseline Arousal/Alertness: Awake/alert Orientation Level: Oriented to person;Oriented to place;Disoriented to time;Disoriented to situation Awareness: Impaired Problem Solving: Impaired Executive Function: Sequencing Sequencing: Impaired Initiating: Impaired Behaviors: Restless;Impulsive Safety/Judgment: Impaired Comments: Decreased awareness of deficits, decreased safety awareness Sensation Sensation Light Touch: Appears Intact(unable to formally assess 2/2 cognition) Coordination Gross Motor Movements are Fluid and Coordinated: Yes Fine Motor Movements are Fluid and Coordinated: No Coordination and Movement Description: slow and cautious movements Heel Shin Test: [facility] Motor  Motor Motor: Hemiplegia Motor - Discharge Observations: R hemi UE>LE,  generalized weakness  Mobility Bed Mobility Bed Mobility: Supine to Sit;Sit to Supine;Rolling Left;Rolling Right Rolling Right: Supervision/verbal cueing Rolling Left: Supervision/Verbal cueing Supine to Sit: Supervision/Verbal cueing Sit to Supine: Supervision/Verbal cueing Transfers Transfers: Sit to Stand;Stand to Sit;Stand Pivot Transfers Sit to Stand: Supervision/Verbal cueing Stand to Sit: Supervision/Verbal  cueing Stand Pivot Transfers: Supervision/Verbal cueing Transfer (Assistive device): None Locomotion  Gait Ambulation: Yes Gait Assistance: Supervision/Verbal cueing Gait Distance (Feet): 150 Feet Assistive device: None Gait Assistance Details: Verbal cues for precautions/safety Gait Gait velocity: decreased Stairs / Additional Locomotion Stairs Assistance: Supervision/Verbal cueing Stair Management Technique: One rail Left Number of Stairs: 12 Height of Stairs: 6 Ramp: Supervision/Verbal cueing Curb: Supervision/Verbal cueing Wheelchair Mobility Wheelchair Mobility: No  Trunk/Postural Assessment  Cervical Assessment Cervical Assessment: Within Functional Limits Thoracic Assessment Thoracic Assessment: Within Functional Limits Lumbar Assessment Lumbar Assessment: Within Functional Limits Postural Control Postural Control: Within Functional Limits  Balance Standardized Balance Assessment Standardized Balance Assessment: Berg Balance Test Berg Balance Test Sit to Stand: Able to stand without using hands and stabilize independently Standing Unsupported: Able to stand safely 2 minutes Sitting with Back Unsupported but Feet Supported on Floor or Stool: Able to sit safely and securely 2 minutes Stand to Sit: Sits safely with minimal use of hands Transfers: Able to transfer safely, minor use of hands Standing Unsupported with Eyes Closed: Able to stand 10 seconds with supervision Standing Ubsupported with Feet Together: Able to place feet together independently  and stand for 1 minute with supervision From Standing, Reach Forward with Outstretched Arm: Can reach forward >12 cm safely (5") From Standing Position, Pick up Object from Floor: Able to pick up shoe safely and easily From Standing Position, Turn to Look Behind Over each Shoulder: Looks behind from both sides and weight shifts well Turn 360 Degrees: Able to turn 360 degrees safely but slowly Standing Unsupported, Alternately Place Feet on Step/Stool: Able to stand independently and complete 8 steps >20 seconds Standing Unsupported, One Foot in Front: Able to plae foot ahead of the other independently and hold 30 seconds Standing on One Leg: Able to lift leg independently and hold equal to or more than 3 seconds Total Score: 47 Static Sitting Balance Static Sitting - Level of Assistance: 5: Stand by assistance Dynamic Sitting Balance Dynamic Sitting - Level of Assistance: 5: Stand by assistance Static Standing Balance Static Standing - Level of Assistance: 5: Stand by assistance Dynamic Standing Balance Dynamic Standing - Level of Assistance: 5: Stand by assistance Extremity Assessment  RLE Assessment RLE Assessment: Exceptions to [facility] Passive Range of Motion (PROM) Comments: [facility] General Strength Comments: 4/5 globally LLE Assessment LLE Assessment: Within Functional Limits    Tevan Marian K Duvan Mousel 01/26/2018, 8:44 AM

## 2018-01-24 NOTE — Progress Notes (Signed)
Occupational Therapy Session Note  Patient Details  Name: Courtney Willis MRN: [id number] Date of Birth: August 21, 1934  Today's Date: 01/24/2018 OT Individual Time: 6389-3734 and 1130 - 2876 OT Individual Time Calculation (min): 59 min and 35 min   Short Term Goals: Week 1:  OT Short Term Goal 1 (Week 1): STG=LTG d/t ELOS  Skilled Therapeutic Interventions/Progress Updates:    Visit 1: Pain: no c/o pain Pt seen for ADL training with a focus on forced use of RUE and motor planning. Pt received in recliner and nodded yes that she would like to shower. She demonstrated to this therapist that she can lift her R arm overhead to 120 degrees and has a fair plus grasp.    Pt ambulated in room with CGA to retrieve clothing from drawers with max A to find clothing as she kept picking out shirts. She stood at sink to brush teeth and therapist placed toothbrush in R hand. Pt did try to use it but had obvious difficulty with motor planning how to manipulate it in her mouth so switched to her L hand.  Pt ambulated to bathroom to undress with mod A sitting on toilet and then transitioned to shower.  She tends to perseverate her movements with LUE such as repeating washing behind her neck.and chest.  Hand over hand to guide her to wash her R arm and then hand over hand on R hand to completely and thoroughly wash L arm. Forced use of R hand to wash thighs and pt appeared to be very uncomfortable with this, almost as if she did not recognize her R hand and was surprised by it.  Pt returned to recliner to dress.  Hand over hand guidance for forced use of R hand to push shirt sleeve on L arm, to grasp underpants and pants to assist pulling and to pull on socks.  Mod A overall with dressing as pt needs max cues and guidance as she neglects R side.  Pt did seem fatigued at end of session.  Returned to recliner to rest with chair belt alarm on and all needs met.  Visit 2: Pain: no c/o pain   Upon entering the room, the  pt was actively unfastening her chair belt alarm and standing up.  Therapist ran over to pt and pt began walking to the bathroom. She lowered pants without A and sat for several minutes but did not void.  She cleansed and then needed min A to fully pull pants over hips.  Pt began walking back to recliner but cued to wash her hands. Max cues to attend to R hand for washing and rinsing.   Returned to recliner and engaged pt from both sitting and standing in yoga exercises for bilateral hand integration: prayer hands to mountain; prayer hands to head, chest, knees; chair pose with prayer hands moving to mountain and back; standing and alternating arms from warrior 1 position to prayer hands to warrior 1 on other side.  Pt actually only needed less than 10% cues on moving her R side and her movement patterns flowed fairly well.  She used to do yoga so the movements were familiar to her.  Pt's lunch arrived and meal tray set up for pt. Pt in recliner with chair belt alarm on and call light in reach.     Therapy Documentation Precautions:  Precautions Precautions: Fall, Other (comment) Precaution Comments: Impulsive Restrictions Weight Bearing Restrictions: No    Vital Signs: Therapy Vitals Temp: 98 F (  36.7 C) Temp Source: Oral Pulse Rate: 68 Resp: 15 BP: (!) 126/99 Patient Position (if appropriate): Lying Oxygen Therapy SpO2: 98 % O2 Device: Room Air   Therapy/Group: Individual Therapy  SAGUIER,JULIA 01/24/2018, 8:09 AM

## 2018-01-24 NOTE — Plan of Care (Signed)
  Problem: RH SAFETY Goal: RH STG ADHERE TO SAFETY PRECAUTIONS W/ASSISTANCE/DEVICE Description STG Adhere to Safety Precautions With Min Assistance/Device.  Outcome: Progressing  Call light within reach, bed/chair alarm, proper footwear, telesitter, floor mats

## 2018-01-24 NOTE — NC FL2 (Signed)
[place] LEVEL OF CARE SCREENING TOOL     IDENTIFICATION  Patient Name: Courtney Willis Birthdate: Mar 23, 1934 Sex: female Admission Date (Current Location): 01/18/2018  [facility] and [place] Number:  Engineering geologist and Address:  [facility], [place] [address], [place], [place] [address]      Provider Number: [id number]  Attending Physician Name and Address:  Charlett Blake, MD  Relative Name and Phone Number:  Lionel December [phone]    Current Level of Care: Other (Comment)(rehab) Recommended Level of Care: [place] Prior Approval Number:    Date Approved/Denied:   PASRR Number: [id number]  Discharge Plan: SNF    Current Diagnoses: Patient Active Problem List   Diagnosis Date Noted  . SAH (subarachnoid hemorrhage) ([place]) 01/18/2018  . Intraparenchymal hemorrhage of brain ([place])   . Dyslipidemia   . Hyponatremia   . Tobacco abuse   . Hyperlipidemia   . Acute congestive heart failure with left ventricular diastolic dysfunction ([place])   . Acute blood loss anemia   . ICH (intracerebral hemorrhage) ([place]) 01/13/2018  . Subclinical hypothyroidism 07/20/2017  . Bunion 08/12/2014  . Cataract 08/12/2014  . CN (constipation) 08/12/2014  . Edema of foot 08/12/2014  . Fibroids, submucosal 08/12/2014  . Glaucoma 08/12/2014  . Hypercholesteremia 08/12/2014  . Coitalgia 08/12/2014  . 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 Avitaminosis D 08/12/2014  . Post menopausal syndrome 08/12/2014  . Alcohol use 08/06/2014  . Acid reflux 02/26/2008  . DD (diverticular disease) 02/16/2008  . Bowel disease 02/16/2008    Orientation RESPIRATION BLADDER Height & Weight     Self, Time, Situation, Place  Normal Continent Weight: 136 lb 14.5 oz (62.1 kg) Height:     BEHAVIORAL SYMPTOMS/MOOD NEUROLOGICAL BOWEL NUTRITION STATUS      Continent Diet(regular diet thin liquids)  AMBULATORY STATUS  COMMUNICATION OF NEEDS Skin   Limited Assist Non-Verbally Normal                       Personal Care Assistance Level of Assistance  Bathing, Dressing, Feeding Bathing Assistance: Limited assistance Feeding assistance: Limited assistance Dressing Assistance: Limited assistance     Functional Limitations Info  Speech     Speech Info: Impaired    SPECIAL CARE FACTORS FREQUENCY  PT (By licensed PT), OT (By licensed OT), Speech therapy     PT Frequency: 5x week OT Frequency: 5x week     Speech Therapy Frequency: 5x week      Contractures Contractures Info: Not present    Additional Factors Info  Code Status Code Status Info: Full Code             Current Medications (01/24/2018):  This is the current hospital active medication list Current Facility-Administered Medications  Medication Dose Route Frequency Provider Last Rate Last Dose  . acetaminophen (TYLENOL) tablet 650 mg  650 mg Oral Q4H PRN Angiulli, Lavon Paganini, PA-C       Or  . acetaminophen (TYLENOL) solution 650 mg  650 mg Per Tube Q4H PRN Angiulli, Lavon Paganini, PA-C       Or  . acetaminophen (TYLENOL) suppository 650 mg  650 mg Rectal Q4H PRN Angiulli, Lavon Paganini, PA-C      . pantoprazole (PROTONIX) EC tablet 40 mg  40 mg Oral QHS AngiulliLavon Paganini, PA-C   40 mg at 01/23/18 2323  . polyvinyl alcohol (LIQUIFILM TEARS) 1.4 % ophthalmic solution 1  drop  1 drop Both Eyes PRN Angiulli, Lavon Paganini, PA-C      . pravastatin (PRAVACHOL) tablet 20 mg  20 mg Oral q1800 AngiulliLavon Paganini, PA-C   20 mg at 01/23/18 1711  . senna-docusate (Senokot-S) tablet 1 tablet  1 tablet Oral BID Cathlyn Parsons, PA-C   1 tablet at 01/24/18 7096  . sorbitol 70 % solution 30 mL  30 mL Oral Daily PRN Cathlyn Parsons, PA-C   30 mL at 01/22/18 0542  . timolol (TIMOPTIC) 0.5 % ophthalmic solution 1 drop  1 drop Both Eyes QHS Angiulli, Lavon Paganini, PA-C   1 drop at 01/23/18 2323     Discharge Medications: Please see discharge summary for  a list of discharge medications.  Relevant Imaging Results:  Relevant Lab Results:   Additional Information GEZ:[id number]  Elease Hashimoto, LCSW

## 2018-01-24 NOTE — Patient Care Conference (Signed)
Inpatient RehabilitationTeam Conference and Plan of Care Update Date: 01/24/2018   Time: 11:20 AM    Patient Name: Courtney Willis      Medical Record Number: [id number]  Date of Birth: 10/05/1934 Sex: Female         Room/Bed: 4W17C/4W17C-01 Payor Info: Payor: [employer] MEDICARE / Plan: [employer] MEDICARE / Product Type: *No Product type* /    Admitting Diagnosis: L frontal hemorrhage sah  Admit Date/Time:  01/18/2018  3:37 PM Admission Comments: No comment available   Primary Diagnosis:  <principal problem not specified> Principal Problem: <principal problem not specified>  Patient Active Problem List   Diagnosis Date Noted  . SAH (subarachnoid hemorrhage) ([place]) 01/18/2018  . Intraparenchymal hemorrhage of brain ([place])   . Dyslipidemia   . Hyponatremia   . Tobacco abuse   . Hyperlipidemia   . Acute congestive heart failure with left ventricular diastolic dysfunction ([place])   . Acute blood loss anemia   . ICH (intracerebral hemorrhage) ([place]) 01/13/2018  . Subclinical hypothyroidism 07/20/2017  . Bunion 08/12/2014  . Cataract 08/12/2014  . CN (constipation) 08/12/2014  . Edema of foot 08/12/2014  . Fibroids, submucosal 08/12/2014  . Glaucoma 08/12/2014  . Hypercholesteremia 08/12/2014  . Coitalgia 08/12/2014  . 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 Avitaminosis D 08/12/2014  . Post menopausal syndrome 08/12/2014  . Alcohol use 08/06/2014  . Acid reflux 02/26/2008  . DD (diverticular disease) 02/16/2008  . Bowel disease 02/16/2008    Expected Discharge Date: Expected Discharge Date: 01/27/18  Team Members Present: Physician leading conference: Dr. Alysia Penna Social Worker Present: Ovidio Kin, LCSW Nurse Present: Dorien Chihuahua, RN PT Present: Burnard Bunting, PT OT Present: Roanna Epley, COTA SLP Present: Stormy Fabian, SLP PPS Coordinator present : Daiva Nakayama, RN, CRRN     Current Status/Progress Goal Weekly Team Focus  Medical   Severe  receptive a phasic.  Also has upper extremity weakness on the right and decreased balance  Reduce fall risk, improve communication  Blood pressure management, improve safety awareness   Bowel/Bladder   LBM 12/22  remain continent of bowel and bladder  Assess bowel and bladder needs qshift    Swallow/Nutrition/ Hydration             ADL's   supervsion/CGA overall with max verbal cues for task initiation, sequencing, RUE functional use, and safety awareness  supervision overall  education, BADL retraining, functional amb without AD   Mobility   supervision-CGA for all mobility up to 200 ft with gait  supervision  cognition, awareness, R attention, balance, d/c planning   Communication   Max A for unstructured, Mod A for more structured expression tasks  Mod A  expression/comprehension of basic auditory info   Safety/Cognition/ Behavioral Observations  Max A to Mod A  Mod A  intellectual awareness   Pain   No c/o pain  remain free of pain  Assess pain Qshift and PRN   Skin   No skin impairments   remain free of skin imaoirments and pressure injuires  Assess skin qshift and PRN      *See Care Plan and progress notes for long and short-term goals.     Barriers to Discharge  Current Status/Progress Possible Resolutions Date Resolved   Physician    Medical stability;Other (comments)  Receptive aphasia limits communication  Progressing towards goals  SNF in her multilevel living facility      Nursing  PT                    OT                  SLP                SW                Discharge Planning/Teaching Needs:  Plan to go [facility] rehab unit on Friday, family to transport to facility.      Team Discussion:  Goals supervision-CGA level. Requires constant cues for sequencing and iniatition. UE weakness. Speehc amin issue. Plan to go to [facility] rehab then transition to ALF.   Revisions to Treatment Plan:  DC 12/27    Continued Need for Acute  Rehabilitation Level of Care: The patient requires daily medical management by a physician with specialized training in physical medicine and rehabilitation for the following conditions: Daily direction of a multidisciplinary physical rehabilitation program to ensure safe treatment while eliciting the highest outcome that is of practical value to the patient.: Yes Daily medical management of patient stability for increased activity during participation in an intensive rehabilitation regime.: Yes Daily analysis of laboratory values and/or radiology reports with any subsequent need for medication adjustment of medical intervention for : Neurological problems   I attest that I was present, lead the team conference, and concur with the assessment and plan of the team.   Elease Hashimoto 01/24/2018, 1:00 PM

## 2018-01-24 NOTE — Progress Notes (Signed)
Speech Language Pathology Daily Session Note  Patient Details  Name: Courtney Willis MRN: [id number] Date of Birth: 10/14/1934  Today's Date: 01/24/2018 SLP Individual Time: 9735-3299 SLP Individual Time Calculation (min): 43 min  Short Term Goals: Week 1: SLP Short Term Goal 1 (Week 1): STG=LTG due to ELOS  Skilled Therapeutic Interventions:  Pt was seen for skilled ST targeting cognitive-linguistic goals.  Pt was eating her lunch upon therapist's arrival and needed min cues to ask for help with opening containers.  After meal, pt requested to brushed her teeth when asked and needed mod-max cues for task sequencing and attention to objects on her right.  SLP facilitated the session with a novel card game targeting problem solving goals and ongoing diagnostic treatment of memory.  Pt initially needed max assist multimodal cues; however, as task progressed therapist was able to fade cues to mod assist multimodal cues.   Pt was able to carryover rules of game as evidenced by appropriate problem solving with multiple similar scenarios with mod assist verbal and visual cues.  Pt was returned to room and left in recliner with chair alarm set and call bell within reach.    Pain Pain Assessment Pain Scale: 0-10 Pain Score: 0-No pain  Therapy/Group: Individual Therapy  Adamari Frede, Selinda Orion 01/24/2018, 9:52 AM

## 2018-01-24 NOTE — Progress Notes (Addendum)
Social Work Patient ID: Courtney Willis, female   DOB: 03-Mar-1934, 83 y.o.   MRN: [id number]  Spoke with daughter who reports [facility] can take pt into rehab unit on Friday. Have also spoken with Seth Bake at [facility] and she confirms this. Plan to transfer to [place] on Friday family to transport to facility. Daughter aware and happy about this. Will be here Friday at 10:00 am

## 2018-01-24 NOTE — Progress Notes (Signed)
[facility] PHYSICAL MEDICINE & REHABILITATION PROGRESS NOTE   Subjective/Complaints:  Aphasia, able to do some sentence completion, "I had a ___"  ROS- cannot obtrain due to aphasia Objective:   No results found. No results for input(s): WBC, HGB, HCT, PLT in the last 72 hours. No results for input(s): NA, K, CL, CO2, GLUCOSE, BUN, CREATININE, CALCIUM in the last 72 hours.  Intake/Output Summary (Last 24 hours) at 01/24/2018 0841 Last data filed at 01/24/2018 0834 Gross per 24 hour  Intake 720 ml  Output 1 ml  Net 719 ml     Physical Exam: Vital Signs Blood pressure (!) 126/99, pulse 68, temperature 98 F (36.7 C), temperature source Oral, resp. rate 15, weight 62.1 kg, SpO2 98 %.  General: No acute distress Mood and affect are appropriate Heart: Regular rate and rhythm no rubs murmurs or extra sounds Lungs: Clear to auscultation, breathing unlabored, no rales or wheezes Abdomen: Positive bowel sounds, soft nontender to palpation, nondistended Extremities: No clubbing, cyanosis, or edema Skin: No evidence of breakdown, no evidence of rash Neurologic: Cranial nerves II through XII intact, motor strength is 5/5 in left and 4-/5 Right deltoid, bicep, tricep, grip,4+ hip flexor, knee extensors, ankle dorsiflexor and plantar flexor Reduced rapid alternating movements R UE sup/pron Sensory exam reports equal light touch  bilateral upper extremities  Musculoskeletal: Full range of motion in all 4 extremities. No joint swelling    Assessment/Plan: 1. Functional deficits secondary to Left frontal lobe IPH with apraxia , fluent aphasia and visual agnosia , right hemipaesis which require 3+ hours per day of interdisciplinary therapy in a comprehensive inpatient rehab setting.  Physiatrist is providing close team supervision and 24 hour management of active medical problems listed below.  Physiatrist and rehab team continue to assess barriers to discharge/monitor patient progress  toward functional and medical goals  Care Tool:  Bathing    Body parts bathed by patient: Right arm, Left arm, Chest, Abdomen, Front perineal area, Buttocks, Right upper leg, Right lower leg, Left upper leg, Left lower leg, Face         Bathing assist Assist Level: Contact Guard/Touching assist     Upper Body Dressing/Undressing Upper body dressing   What is the patient wearing?: Pull over shirt    Upper body assist Assist Level: Maximal Assistance - Patient 25 - 49%    Lower Body Dressing/Undressing Lower body dressing      What is the patient wearing?: Underwear/pull up, Pants     Lower body assist Assist for lower body dressing: Maximal Assistance - Patient 25 - 49%     Toileting Toileting    Toileting assist Assist for toileting: Supervision/Verbal cueing     Transfers Chair/bed transfer  Transfers assist     Chair/bed transfer assist level: Contact Guard/Touching assist     Locomotion Ambulation   Ambulation assist      Assist level: Contact Guard/Touching assist Assistive device: Other (comment)(none) Max distance: 150'   Walk 10 feet activity   Assist     Assist level: Contact Guard/Touching assist Assistive device: Hand held assist   Walk 50 feet activity   Assist    Assist level: Contact Guard/Touching assist Assistive device: Hand held assist    Walk 150 feet activity   Assist Walk 150 feet activity did not occur: Safety/medical concerns(per report)  Assist level: Contact Guard/Touching assist Assistive device: Hand held assist    Walk 10 feet on uneven surface  activity   Assist Walk 10  feet on uneven surfaces activity did not occur: Safety/medical concerns         Wheelchair     Assist Will patient use wheelchair at discharge?: No   Wheelchair activity did not occur: N/A         Wheelchair 50 feet with 2 turns activity    Assist    Wheelchair 50 feet with 2 turns activity did not occur:  N/A       Wheelchair 150 feet activity     Assist Wheelchair 150 feet activity did not occur: N/A        Medical Problem List and Plan: 1.   Decreased functional mobility with aphasia secondary to  Left frontal lobe intraparenchymal hemorrhage/SAH likely due to CAA( cerebral amyloid angiopathy) Team conference today please see physician documentation under team conference tab, met with team face-to-face to discuss problems,progress, and goals. Formulized individual treatment plan based on medical history, underlying problem and comorbidities. Will return to [facility],The Friday was in [place] now will be in SNF or ALF level 2.  DVT Prophylaxis/Anticoagulation: SCDs. 3. Pain Management:  Tylenol as needed 4. Mood:  Provide emotional support 5. Neuropsych: This patient is capable of making decisions on her own behalf. 6. Skin/Wound Care:  Routine skin checks 7. Fluids/Electrolytes/Nutrition:  Routine in and out's , BUN/Cr normal 01/19/2018, fluid intake 120 mL recorded yesterday-recheck BMET today 8. Hyperlipidemia. Begin Pravachol 20 mg daily 14.  hx Hyponatremia: Recheck BMET  today  LOS: 6 days A FACE TO FACE EVALUATION WAS PERFORMED  Charlett Blake 01/24/2018, 8:41 AM

## 2018-01-24 NOTE — Progress Notes (Signed)
Physical Therapy Session Note  Patient Details  Name: Courtney Willis MRN: [id number] Date of Birth: Aug 08, 1934  Today's Date: 01/24/2018 PT Individual Time: 0950-1045 PT Individual Time Calculation (min): 55 min   Short Term Goals: Week 1:  PT Short Term Goal 1 (Week 1): =LTGs due to ELOS  Skilled Therapeutic Interventions/Progress Updates:    Pt seated in recliner upon PT arrival, agreeable to therapy tx and denies pain. Pt transferred to standing with supervision and ambulated throughout unit this session with supervision-CGA, no AD. Pt ambulated x 100 ft to dayroom and used nustep x 6 minutes on workload 5 for global strengthening and endurance. Pt worked on dynamic standing balance and attention to performed activity ambulating/weaving through the cones and to perform sidestepping. Pt worked on [employer] during functional tasks to fold towels and don pillow cases, cueing for use of R hand, increased time to complete. Pt worked on R attention and R UE use in order to perform peg board puzzle, therapist holding pt's left hand to encourage R hand use only. Pt ambulated back to room and left seated in recliner with needs in reach and chair alarm set.    Therapy Documentation Precautions:  Precautions Precautions: Fall, Other (comment) Precaution Comments: Impulsive Restrictions Weight Bearing Restrictions: No   Therapy/Group: Individual Therapy  Netta Corrigan, PT, DPT 01/24/2018, 6:45 AM

## 2018-01-25 NOTE — Plan of Care (Signed)
  Problem: RH SAFETY Goal: RH STG ADHERE TO SAFETY PRECAUTIONS W/ASSISTANCE/DEVICE Description STG Adhere to Safety Precautions With Min Assistance/Device.  Outcome: Progressing  Call light within reach, bed/chair alarm, proper footwear, telesitter

## 2018-01-25 NOTE — Plan of Care (Signed)
  Problem: Consults Goal: RH STROKE PATIENT EDUCATION Description See Patient Education module for education specifics  Outcome: Progressing   Problem: RH BOWEL ELIMINATION Goal: RH STG MANAGE BOWEL WITH ASSISTANCE Description STG Manage Bowel with Mod Assistance.  Outcome: Progressing   Problem: RH BLADDER ELIMINATION Goal: RH STG MANAGE BLADDER WITH ASSISTANCE Description STG Manage Bladder With Mod Assistance  Outcome: Progressing   Problem: RH SKIN INTEGRITY Goal: RH STG SKIN FREE OF INFECTION/BREAKDOWN Description No new breakdown with min assist    Outcome: Progressing   Problem: RH SAFETY Goal: RH STG ADHERE TO SAFETY PRECAUTIONS W/ASSISTANCE/DEVICE Description STG Adhere to Safety Precautions With Min Assistance/Device.  Outcome: Progressing   Problem: RH COGNITION-NURSING Goal: RH STG USES MEMORY AIDS/STRATEGIES W/ASSIST TO PROBLEM SOLVE Description STG Uses Memory Aids/Strategies With Min Assistance to Problem Solve.  Outcome: Progressing   Problem: RH PAIN MANAGEMENT Goal: RH STG PAIN MANAGED AT OR BELOW PT'S PAIN GOAL Description < 2 out of 10.   Outcome: Progressing

## 2018-01-26 ENCOUNTER — Inpatient Hospital Stay (HOSPITAL_COMMUNITY): Payer: Medicare Other | Admitting: Physical Therapy

## 2018-01-26 ENCOUNTER — Inpatient Hospital Stay (HOSPITAL_COMMUNITY): Payer: Medicare Other | Admitting: Speech Pathology

## 2018-01-26 ENCOUNTER — Encounter (HOSPITAL_COMMUNITY): Payer: Medicare Other | Admitting: Occupational Therapy

## 2018-01-26 DIAGNOSIS — R0989 Other specified symptoms and signs involving the circulatory and respiratory systems: Secondary | ICD-10-CM

## 2018-01-26 MED ORDER — PANTOPRAZOLE SODIUM 40 MG PO TBEC: 40.0000 mg | DELAYED_RELEASE_TABLET | Freq: Every day | ORAL | Status: DC

## 2018-01-26 MED ORDER — ACETAMINOPHEN 325 MG PO TABS: 650.0000 mg | ORAL_TABLET | ORAL | Status: DC | PRN

## 2018-01-26 NOTE — Progress Notes (Signed)
[facility] PHYSICAL MEDICINE & REHABILITATION PROGRESS NOTE   Subjective/Complaints: Patient seen laying in bed this morning.  No reported issues.  Persistent expressive aphasia.  ROS- cannot obtrain due to aphasia  Objective:   No results found. No results for input(s): WBC, HGB, HCT, PLT in the last 72 hours. Recent Labs    01/24/18 0922  NA 137  K 4.0  CL 101  CO2 26  GLUCOSE 104*  BUN 13  CREATININE 0.78  CALCIUM 9.4    Intake/Output Summary (Last 24 hours) at 01/26/2018 1258 Last data filed at 01/26/2018 0858 Gross per 24 hour  Intake 240 ml  Output -  Net 240 ml     Physical Exam: Vital Signs Blood pressure (!) 147/73, pulse 71, temperature 98.5 F (36.9 C), temperature source Oral, resp. rate 18, weight 62.1 kg, SpO2 99 %.  General: NAD.  Vital signs reviewed.  Well-developed.   Heart: Regular rate and rhythm no JVD. Lungs: Clear to auscultation, unlabored. Abdomen: Positive bowel sounds, nondistended Skin: No evidence of breakdown, no evidence of rash Neurologic:  Expressive aphasia Motor strength is 5/5 in LUE/LLE 4/5 Right deltoid, bicep, tricep, grip 4/5 hip flexor, knee extensors, ankle dorsiflexor and plantar flexor Musculoskeletal: No edema or tenderness in extremities  Assessment/Plan: 1. Functional deficits secondary to Left frontal lobe IPH with apraxia , fluent aphasia and visual agnosia , right hemipaesis which require 3+ hours per day of interdisciplinary therapy in a comprehensive inpatient rehab setting.  Physiatrist is providing close team supervision and 24 hour management of active medical problems listed below.  Physiatrist and rehab team continue to assess barriers to discharge/monitor patient progress toward functional and medical goals  Care Tool:  Bathing    Body parts bathed by patient: Right arm, Left arm, Chest, Abdomen, Front perineal area, Buttocks, Right upper leg, Right lower leg, Left upper leg, Left lower leg, Face          Bathing assist Assist Level: Contact Guard/Touching assist     Upper Body Dressing/Undressing Upper body dressing   What is the patient wearing?: Pull over shirt, Bra    Upper body assist Assist Level: Moderate Assistance - Patient 50 - 74%    Lower Body Dressing/Undressing Lower body dressing      What is the patient wearing?: Underwear/pull up, Pants     Lower body assist Assist for lower body dressing: Moderate Assistance - Patient 50 - 74%     Toileting Toileting    Toileting assist Assist for toileting: Minimal Assistance - Patient > 75%(min A to fully pull pants up as she was wearing Yoana jeggings)     Transfers Chair/bed transfer  Transfers assist     Chair/bed transfer assist level: Supervision/Verbal cueing     Locomotion Ambulation   Ambulation assist      Assist level: Supervision/Verbal cueing Assistive device: Other (comment)(none) Max distance: 150'   Walk 10 feet activity   Assist     Assist level: Supervision/Verbal cueing Assistive device: Other (comment)(none)   Walk 50 feet activity   Assist    Assist level: Supervision/Verbal cueing Assistive device: Other (comment)(none)    Walk 150 feet activity   Assist Walk 150 feet activity did not occur: Safety/medical concerns(per report)  Assist level: Supervision/Verbal cueing Assistive device: Other (comment)(none)    Walk 10 feet on uneven surface  activity   Assist Walk 10 feet on uneven surfaces activity did not occur: Safety/medical concerns   Assist level: Contact Guard/Touching assist Assistive  device: Other (comment)(none)   Wheelchair     Assist Will patient use wheelchair at discharge?: No   Wheelchair activity did not occur: N/A         Wheelchair 50 feet with 2 turns activity    Assist    Wheelchair 50 feet with 2 turns activity did not occur: N/A       Wheelchair 150 feet activity     Assist Wheelchair 150 feet  activity did not occur: N/A        Medical Problem List and Plan: 1.   Decreased functional mobility with aphasia secondary to  Left frontal lobe intraparenchymal hemorrhage/SAH likely due to CAA( cerebral amyloid angiopathy)  Cont CIR  Plan for d/c tomorrow to SNF  Patient to follow-up for transitional care management in 1 month post-discharge 2.  DVT Prophylaxis/Anticoagulation: SCDs. 3. Pain Management:  Tylenol as needed 4. Mood:  Provide emotional support 5. Neuropsych: This patient is capable of making decisions on her own behalf. 6. Skin/Wound Care:  Routine skin checks 7. Fluids/Electrolytes/Nutrition:  Routine in and out's   BMP within acceptable range on 12/24 8. Hyperlipidemia. Begin Pravachol 20 mg daily 9.  Labile blood pressure  Labile on 12/26  LOS: 8 days A FACE TO FACE EVALUATION WAS PERFORMED  Sullivan Blasing Lorie Phenix 01/26/2018, 12:58 PM

## 2018-01-26 NOTE — Progress Notes (Signed)
Speech Language Pathology Discharge Summary  Patient Details  Name: Courtney Willis MRN: [id number] Date of Birth: 07-Feb-1934  Today's Date: 01/26/2018 SLP Individual Time: 1100-1155 SLP Individual Time Calculation (min): 55 min   Skilled Therapeutic Interventions:   Skilled treatment session focused on communication goals. SLP facilitated session by providing extra time and supervision verbal cues for patient to name functional items with 100% accuracy. Patient could also verbally describe actions within a picture at the phrase level with Min A verbal cues, however, Max A multimodal cues needed for generation of spontaneous speech in regards to open-ended personal questions (likes/dislikes, etc). Patient demonstrated echolalia and perseveration throughout task. Patient's reading comprehension appears grossly intact and patient was 100% accurate for reading comprehension at the paragraph level. Patient able to write functional information like her name and address with extra time and ability to self-monitor and correct errors. Hoe[place]ver, Max-Total A multimodal cues needed when writing novel information at the phrase level. Patient answered yes/no questions with 100% accuracy but required total A to follow multi-step sequential commands, suspect function impacted by motor planning. Patient left upright in recliner with alarm on and all needs within reach. Continue with current plan of care.    Patient has met 4 of 4 long term goals.  Patient to discharge at overall Mod level.   Reasons goals not met: N/A   Clinical Impression/Discharge Summary: Patient has made functional gains and has met 4 of 4 LTGs this admission. Currently, patient requires overall Mod A multimodal cues for word-finding within spontaneous speech and verbal expression of basic wants/needs at the word and phrase level. However, patient can name functional items with overall 100% accuracy and supervision verbal cues. Patient's verbal  expression is characterized by echolalia with perseveration. Patient demonstrates increased awareness of errors but requires overall Mod-Max A multimodal cues to self-correct. Patient demonstrates increased ability to follow commands but demonstrates difficulty with sequential commands due to motor planning. Moderate verbal cues are also needed for sequencing with functional tasks and attention to right field of environment. Patient and family education complete. Patient's family is unable to provide the necessary assistance needed at this time, therefore, patient will discharge to a SNF. Patient would benefit from f/u SLP services to maximize her cognitive-linguistic function and overall functional independence.   Care Partner:  Caregiver Able to Provide Assistance: No  Type of Caregiver Assistance: Physical;Cognitive  Recommendation:  24 hour supervision/assistance;Skilled Nursing facility  Rationale for SLP Follow Up: Maximize functional communication;Maximize cognitive function and independence;Reduce caregiver burden   Equipment: N/A   Reasons for discharge: Discharged from hospital   Patient/Family Agrees with Progress Made and Goals Achieved: Yes    [place], [place] 01/26/2018, 12:49 PM

## 2018-01-26 NOTE — Discharge Summary (Signed)
NAME: Courtney Willis, Courtney Willis MEDICAL RECORD ME:[id number] ACCOUNT [id number] DATE OF BIRTH:[birth year] FACILITY: [facility] LOCATION: [facility] PHYSICIAN:ANDREW Letta Pate, MD  DISCHARGE SUMMARY  DATE OF DISCHARGE:  01/27/2018  DISCHARGE DIAGNOSES: 1.  Left frontal lobe intraparenchymal hemorrhage, likely secondary to cerebral amyloid angiopathy.   2.  Sequential compression devices for deep venous thrombosis prophylaxis. 3.  Pain management. 4.  Hyperlipidemia.   5.  History of hyponatremia.  HISTORY OF PRESENT ILLNESS:  An 83 year old right-handed female with history of hyperlipidemia, tobacco abuse,  presented 01/13/2018 with aphasia.  She lives alone, independent living facility at [facility] prior to admission and active.  One level home.   The patient found by a friend wandering about the house half dressed.    CT of the head showed  CT of the head showed left frontal lobe hemorrhage.  Per report, acute intracranial hemorrhage in the anterior superior frontal gyrus with extension to the left subarachnoid space.  Surrounding edema and regional mass effect with a  4 mm rightward midline shift.  She did not receive tPA.  MRI showed a large left frontal lobe intraparenchymal hemorrhage.  CT angiogram of head and neck with no significant stenosis or occlusion.  Follow up cranial CT scan stable.  Echocardiogram with  ejection fraction of 22%, grade I diastolic dysfunction.  Tolerating a regular diet.  The patient was admitted for a comprehensive rehabilitation program.  PAST MEDICAL HISTORY:  See discharge diagnoses.  SOCIAL HISTORY:  Lives alone, independent living facility.  FUNCTIONAL STATUS:  Upon admission to rehab services was minimal assist 70 feet without assistive device, minimal assist sit to stand, min mod assist with activities of daily living.  PHYSICAL EXAMINATION: VITAL SIGNS:  Blood pressure 126/53, pulse 74, temperature 99, respirations 18. GENERAL:  Alert female, aphasic some  spontaneous speech. HEENT:  EOMs intact. NECK:  Supple, nontender, no JVD. CARDIOVASCULAR:  Rate controlled. ABDOMEN:  Soft, nontender, good bowel sounds. LUNGS:  Clear to auscultation without wheeze.  REHABILITATION HOSPITAL COURSE:  The patient was admitted to inpatient rehabilitation services.  Therapies initiated on a 3-hour daily basis, consisting of physical therapy, occupational therapy, speech therapy and rehabilitation nursing.  The following  issues were addressed during patient's rehabilitation stay:    Pertaining to the patient's the left frontal lobe intraparenchymal hemorrhage remained stable.  She would follow up with neurology services.  SCDs for DVT prophylaxis.  No bleeding episodes.  Blood pressure is controlled.    She remained on Pravachol for history of hyperlipidemia.    Noted history of hyponatremia, stable.  Latest sodium 137.    The patient received weekly collaborative interdisciplinary team conferences to discuss estimated length of stay, family teaching, any barriers to discharge.  Transfers to standing with supervision.  Ambulates 100 feet contact guard assist without  assistive device, working with dynamic standing balance, working with right side attention.  Again, she ambulates to her room.  Contact guard to receive clothing from drawers.  Max assist of fine clothing.  Speech therapy followup for aphasia.  Working  with linguistics.  She could participate in a card game targeting problems, solving goals with cues.  It was advised the need for supervision.  DISCHARGE MEDICATIONS:  Included Protonix 40 mg p.o. at bedtime, Pravachol 20 mg p.o. daily, Senokot-S 1 tablet p.o. b.i.d., Timoptic ophthalmic solution 0.5%, 1 drop both eyes at bedtime, Tylenol as needed.  DIET:  Regular.    FOLLOWUP:  She would follow up with Dr. Alysia Penna at the outpatient rehab  center as advised; Dr. Erlinda Hong of [facility] Neurology Service call for appointment.  Fenton Malling, medical  management.  AN/NUANCE D:01/26/2018 T:01/26/2018 JOB:[id number]

## 2018-01-26 NOTE — Plan of Care (Signed)
  Problem: Consults Goal: RH STROKE PATIENT EDUCATION Description See Patient Education module for education specifics  Outcome: Progressing   Problem: RH BOWEL ELIMINATION Goal: RH STG MANAGE BOWEL WITH ASSISTANCE Description STG Manage Bowel with Mod Assistance.  Outcome: Progressing   Problem: RH BLADDER ELIMINATION Goal: RH STG MANAGE BLADDER WITH ASSISTANCE Description STG Manage Bladder With Mod Assistance  Outcome: Progressing   Problem: RH SKIN INTEGRITY Goal: RH STG SKIN FREE OF INFECTION/BREAKDOWN Description No new breakdown with min assist    Outcome: Progressing   Problem: RH SAFETY Goal: RH STG ADHERE TO SAFETY PRECAUTIONS W/ASSISTANCE/DEVICE Description STG Adhere to Safety Precautions With Min Assistance/Device.  Outcome: Progressing   Problem: RH COGNITION-NURSING Goal: RH STG USES MEMORY AIDS/STRATEGIES W/ASSIST TO PROBLEM SOLVE Description STG Uses Memory Aids/Strategies With Min Assistance to Problem Solve.  Outcome: Progressing   Problem: RH PAIN MANAGEMENT Goal: RH STG PAIN MANAGED AT OR BELOW PT'S PAIN GOAL Description < 2 out of 10.   Outcome: Progressing

## 2018-01-26 NOTE — Discharge Summary (Signed)
Discharge summary job [id number]

## 2018-01-26 NOTE — Progress Notes (Addendum)
Occupational Therapy Session Note  Patient Details  Name: Courtney Willis MRN: [id number] Date of Birth: 08-05-1934  Today's Date: 01/26/2018 OT Group Time: 0900-1000 OT Group Time Calculation (min): 60 min    Short Term Goals: Week 1:  OT Short Term Goal 1 (Week 1): STG=LTG d/t ELOS Week 2:    Week 3:     Skilled Therapeutic Interventions/Progress Updates:    1:1 Engaged in functional group with focus on forced use or right hand and visual attention to the left, sit to stands, dynamic standing balance and ambulating forwards and backwards with min guard to supervision while playing game of life size connect four and corn hole. Pt able to communicate needs and keep score with min cues and extra time. Pt ambulated back to her room pushing the empty w/c with supervision.   Therapy Documentation Precautions:  Precautions Precautions: Fall, Other (comment) Precaution Comments: Impulsive Restrictions Weight Bearing Restrictions: No Pain: Pain Assessment Pain Scale: Faces Faces Pain Scale: No hurt   Therapy/Group: Group Therapy  Willeen Cass [facility] 01/26/2018, 10:22 AM

## 2018-01-27 ENCOUNTER — Inpatient Hospital Stay (HOSPITAL_COMMUNITY): Payer: Medicare Other

## 2018-01-27 NOTE — Progress Notes (Signed)
Social Work  Discharge Note  The overall goal for the admission was met for:   Discharge location: Yes-[facility]-SNF/REHAB  Length of Stay: Yes-9 DAYS  Discharge activity level: Yes-SUPERVISION-MIN ASSIST LEVEL  Home/community participation: Yes  Services provided included: MD, RD, PT, OT, SLP, RN, CM, Pharmacy and SW  Financial Services: Private Insurance: [employer]-MEDICARE  Follow-up services arranged: Other: SHORT TERM-[facility]/REHAB  Comments (or additional information):  Patient/Family verbalized understanding of follow-up arrangements: Yes  Individual responsible for coordination of the follow-up plan: JANET-DAUGHTER  Confirmed correct DME delivered: Dupree, Rebecca G 01/27/2018    Dupree, Rebecca G 

## 2018-01-27 NOTE — Progress Notes (Signed)
Occupational Therapy Discharge Summary  Patient Details  Name: Courtney Willis MRN: [id number] Date of Birth: 10/17/1934  Today's Date: 01/27/2018 OT Individual Time: 7829-5621 OT Individual Time Calculation (min): 60 min    Patient has met 12 of 12 long term goals due to improved activity tolerance, improved balance, ability to compensate for deficits, functional use of  RIGHT upper extremity, improved attention, improved awareness and improved coordination.  Patient to discharge at overall Supervision level.  Patient's care partner is independent to provide the necessary cognitive assistance at discharge.  Pt has made great gains in her apraxia and ability to dress/bath with higher levels of independence and safety. D/t cognitive and communication deficits pt still requires 24/7 (S).   Reasons goals not met: All treatment goals met.   Recommendation:  Patient does not require any follow up OT services.   Equipment: No equipment provided  Reasons for discharge: treatment goals met and discharge from hospital  Patient/family agrees with progress made and goals achieved: Yes   Skilled OT Intervention: Session focused on d/c planning and goal evaluation. Pt completed functional mobility around room at (S) level. Pt completed shower at (S) level overall, no cueing required to wash all body parts thoroughly. Pt sat EOB and donned bra and underwear with no cueing, no assist. Pt required sequencing cues/set up to continue dressing, no physical assist. Pt completed dynavision with mild R inattention still observed. 150+ ft of functional mobility during session at (S) level. Pt returned to room and left sitting up in recliner with chair alarm belt fastened and all needs met.   OT Discharge Precautions/Restrictions  Precautions Precautions: Fall;Other (comment) Precaution Comments: Impulsive Restrictions Weight Bearing Restrictions: No Pain Pain Assessment Pain Scale: Faces Pain Score: 0-No  pain Faces Pain Scale: No hurt Vision Baseline Vision/History: Wears glasses Wears Glasses: At all times Patient Visual Report: Other (comment)(unable to assess) Vision Assessment?: No apparent visual deficits Additional Comments: R inattention Perception  Perception: Impaired Inattention/Neglect: Does not attend to right side of body;Does not attend to right visual field Praxis Praxis: Impaired Praxis Impairment Details: Motor planning;Perseveration;Ideomotor;Initiation;Ideation Cognition Overall Cognitive Status: Impaired/Different from baseline Arousal/Alertness: Awake/alert Orientation Level: Oriented to person;Oriented to place;Oriented to situation;Disoriented to time Attention: Selective Sustained Attention: Appears intact Sustained Attention Impairment: Verbal basic;Functional basic Selective Attention: Impaired Selective Attention Impairment: Functional basic Awareness: Impaired Awareness Impairment: Emergent impairment Problem Solving: Impaired Problem Solving Impairment: Verbal basic;Functional basic Executive Function: Sequencing Sequencing: Impaired Sequencing Impairment: Functional basic Initiating: Impaired Initiating Impairment: Functional complex Behaviors: Restless;Impulsive Safety/Judgment: Impaired Comments: Decreased awareness of deficits, decreased safety awareness Sensation Sensation Light Touch: Appears Intact(unable to fully assess) Hot/Cold: Appears Intact Proprioception: Appears Intact Coordination Gross Motor Movements are Fluid and Coordinated: Yes Fine Motor Movements are Fluid and Coordinated: Yes Coordination and Movement Description: slow and cautious movements Motor  Motor Motor: Hemiplegia Motor - Discharge Observations: R hemi UE>LE, generalized weakness. R UE is functional Mobility  Bed Mobility Bed Mobility: Supine to Sit;Sit to Supine;Rolling Left;Rolling Right Rolling Right: Supervision/verbal cueing Rolling Left:  Supervision/Verbal cueing Supine to Sit: Supervision/Verbal cueing Sit to Supine: Supervision/Verbal cueing Transfers Sit to Stand: Supervision/Verbal cueing Stand to Sit: Supervision/Verbal cueing  Trunk/Postural Assessment  Cervical Assessment Cervical Assessment: Within Functional Limits Thoracic Assessment Thoracic Assessment: Within Functional Limits Lumbar Assessment Lumbar Assessment: Within Functional Limits Postural Control Postural Control: Within Functional Limits  Balance Balance Balance Assessed: Yes Static Sitting Balance Static Sitting - Balance Support: Feet supported Static Sitting - Level of Assistance: 6: Modified  independent (Device/Increase time) Dynamic Sitting Balance Dynamic Sitting - Balance Support: Feet supported Dynamic Sitting - Level of Assistance: 6: Modified independent (Device/Increase time) Sitting balance - Comments: reaching distally to don socks Static Standing Balance Static Standing - Balance Support: During functional activity Static Standing - Level of Assistance: 5: Stand by assistance Dynamic Standing Balance Dynamic Standing - Balance Support: During functional activity Dynamic Standing - Level of Assistance: 5: Stand by assistance Dynamic Standing - Balance Activities: Reaching for objects Extremity/Trunk Assessment RUE Assessment RUE Assessment: Exceptions to [facility] Active Range of Motion (AROM) Comments: WFL General Strength Comments: 3+/5 RUE Body System: Neuro Brunstrum levels for arm and hand: Arm;Hand Brunstrum level for arm: Stage V Relative Independence from Synergy Brunstrum level for hand: Stage VI Isolated joint movements LUE Assessment LUE Assessment: Within Functional Limits   Curtis Sites 01/27/2018, 12:12 PM

## 2018-01-27 NOTE — Progress Notes (Signed)
[facility] PHYSICAL MEDICINE & REHABILITATION PROGRESS NOTE   Subjective/Complaints: Patient seen sitting up in her chair this morning.  She states she slept well overnight.  Her cognition appears to be improving.  She is aware of her discharge.  ROS-difficult to obtrain due to aphasia  Objective:   No results found. No results for input(s): WBC, HGB, HCT, PLT in the last 72 hours. No results for input(s): NA, K, CL, CO2, GLUCOSE, BUN, CREATININE, CALCIUM in the last 72 hours.  Intake/Output Summary (Last 24 hours) at 01/27/2018 1119 Last data filed at 01/26/2018 1700 Gross per 24 hour  Intake 120 ml  Output -  Net 120 ml     Physical Exam: Vital Signs Blood pressure 130/74, pulse 77, temperature 98 F (36.7 C), temperature source Oral, resp. rate 18, weight 62.1 kg, SpO2 100 %.  General: NAD.  Vital signs reviewed.  Well-developed.   Heart: RRR.  No JVD. Lungs: Clear to auscultation.  Unlabored. Abdomen: Positive bowel sounds, nondistended Skin: No evidence of breakdown, no evidence of rash Neurologic:  Expressive aphasia Motor strength is 5/5 in LUE/LLE, stable 4/5 Right deltoid, bicep, tricep, grip stable 4/5 hip flexor, knee extensors, ankle dorsiflexor and plantar flexor Musculoskeletal: No edema or tenderness in extremities  Assessment/Plan: 1. Functional deficits secondary to Left frontal lobe IPH with apraxia , fluent aphasia and visual agnosia , right hemipaesis which require 3+ hours per day of interdisciplinary therapy in a comprehensive inpatient rehab setting.  Physiatrist is providing close team supervision and 24 hour management of active medical problems listed below.  Physiatrist and rehab team continue to assess barriers to discharge/monitor patient progress toward functional and medical goals  Care Tool:  Bathing    Body parts bathed by patient: Right arm, Left arm, Chest, Abdomen, Front perineal area, Buttocks, Right upper leg, Right lower leg,  Left upper leg, Left lower leg, Face         Bathing assist Assist Level: Contact Guard/Touching assist     Upper Body Dressing/Undressing Upper body dressing   What is the patient wearing?: Pull over shirt, Bra    Upper body assist Assist Level: Moderate Assistance - Patient 50 - 74%    Lower Body Dressing/Undressing Lower body dressing      What is the patient wearing?: Underwear/pull up, Pants     Lower body assist Assist for lower body dressing: Moderate Assistance - Patient 50 - 74%     Toileting Toileting    Toileting assist Assist for toileting: Minimal Assistance - Patient > 75%(min A to fully pull pants up as she was wearing Mayme jeggings)     Transfers Chair/bed transfer  Transfers assist     Chair/bed transfer assist level: Supervision/Verbal cueing     Locomotion Ambulation   Ambulation assist      Assist level: Supervision/Verbal cueing Assistive device: Other (comment)(none) Max distance: 150'   Walk 10 feet activity   Assist     Assist level: Supervision/Verbal cueing Assistive device: Other (comment)(none)   Walk 50 feet activity   Assist    Assist level: Supervision/Verbal cueing Assistive device: Other (comment)(none)    Walk 150 feet activity   Assist Walk 150 feet activity did not occur: Safety/medical concerns(per report)  Assist level: Supervision/Verbal cueing Assistive device: Other (comment)(none)    Walk 10 feet on uneven surface  activity   Assist Walk 10 feet on uneven surfaces activity did not occur: Safety/medical concerns   Assist level: Contact Guard/Touching assist Assistive device:  Other (comment)(none)   Wheelchair     Assist Will patient use wheelchair at discharge?: No   Wheelchair activity did not occur: N/A         Wheelchair 50 feet with 2 turns activity    Assist    Wheelchair 50 feet with 2 turns activity did not occur: N/A       Wheelchair 150 feet activity      Assist Wheelchair 150 feet activity did not occur: N/A        Medical Problem List and Plan: 1.   Decreased functional mobility with aphasia secondary to  Left frontal lobe intraparenchymal hemorrhage/SAH likely due to CAA( cerebral amyloid angiopathy)  DC today  Patient to follow-up for transitional care management in 1 month post-discharge 2.  DVT Prophylaxis/Anticoagulation: SCDs. 3. Pain Management:  Tylenol as needed 4. Mood:  Provide emotional support 5. Neuropsych: This patient is capable of making decisions on her own behalf. 6. Skin/Wound Care:  Routine skin checks 7. Fluids/Electrolytes/Nutrition:  Routine in and out's   BMP within acceptable range on 12/24 8. Hyperlipidemia. Begin Pravachol 20 mg daily 9.  Labile blood pressure  Relatively controlled on 12/27  LOS: 9 days A FACE TO FACE EVALUATION WAS PERFORMED  Ankit Lorie Phenix 01/27/2018, 11:19 AM

## 2018-01-27 NOTE — Plan of Care (Signed)
  Problem: Consults Goal: RH STROKE PATIENT EDUCATION Description See Patient Education module for education specifics  Outcome: Progressing   Problem: RH BOWEL ELIMINATION Goal: RH STG MANAGE BOWEL WITH ASSISTANCE Description STG Manage Bowel with Mod Assistance.  Outcome: Progressing   Problem: RH BLADDER ELIMINATION Goal: RH STG MANAGE BLADDER WITH ASSISTANCE Description STG Manage Bladder With Mod Assistance  Outcome: Progressing   Problem: RH SKIN INTEGRITY Goal: RH STG SKIN FREE OF INFECTION/BREAKDOWN Description No new breakdown with min assist    Outcome: Progressing   Problem: RH SAFETY Goal: RH STG ADHERE TO SAFETY PRECAUTIONS W/ASSISTANCE/DEVICE Description STG Adhere to Safety Precautions With Min Assistance/Device.  Outcome: Progressing   Problem: RH COGNITION-NURSING Goal: RH STG USES MEMORY AIDS/STRATEGIES W/ASSIST TO PROBLEM SOLVE Description STG Uses Memory Aids/Strategies With Min Assistance to Problem Solve.  Outcome: Progressing   Problem: RH PAIN MANAGEMENT Goal: RH STG PAIN MANAGED AT OR BELOW PT'S PAIN GOAL Description < 2 out of 10.   Outcome: Progressing

## 2018-01-27 NOTE — Progress Notes (Signed)
[facility] assisted living. Report given to Neoma Laming at [facility]. Pt discharged with family. Belongings sent with pt. Pt stable at time of discharge. No further questions from pt or family.  Courtney Willis Courtney Willis Courtney Willis

## 2018-01-30 DIAGNOSIS — E785 Hyperlipidemia, unspecified: Secondary | ICD-10-CM | POA: Diagnosis not present

## 2018-01-30 DIAGNOSIS — G8191 Hemiplegia, unspecified affecting right dominant side: Secondary | ICD-10-CM | POA: Diagnosis not present

## 2018-01-30 DIAGNOSIS — I680 Cerebral amyloid angiopathy: Secondary | ICD-10-CM | POA: Diagnosis not present

## 2018-01-30 DIAGNOSIS — K219 Gastro-esophageal reflux disease without esophagitis: Secondary | ICD-10-CM | POA: Diagnosis not present

## 2018-02-03 NOTE — Progress Notes (Deleted)
       Patient: Courtney Willis Female    DOB: 1934/02/23   84 y.o.   MRN: [id number] Visit Date: 02/03/2018  Today's Provider: Mar Daring, PA-C   No chief complaint on file.  Subjective:     HPI    Follow up Hospitalization  Patient was admitted to [facility] on 01/18/2018 and discharged on 01/27/2018.  She was treated for [facility] (subarachnoid hemorrhage) Treatment for this included; notes in chart. Telephone follow up was done on; none She reports {DESC; EXCELLENT/GOOD/FAIR:19992} compliance with treatment. She reports this condition is {improved/worse/unchanged:3041574}.  ------------------------------------------------------------------------------------     No Known Allergies   Current Outpatient Medications:  .  acetaminophen (TYLENOL) 325 MG tablet, Take 2 tablets (650 mg total) by mouth every 4 (four) hours as needed for mild pain (or temp > 37.5 C (99.5 F))., Disp: , Rfl:  .  pantoprazole (PROTONIX) 40 MG tablet, Take 1 tablet (40 mg total) by mouth at bedtime., Disp: , Rfl:  .  pravastatin (PRAVACHOL) 20 MG tablet, Take 1 tablet (20 mg total) by mouth daily at 6 PM., Disp: , Rfl:  .  senna-docusate (SENOKOT-S) 8.6-50 MG tablet, Take 1 tablet by mouth 2 (two) times daily., Disp: , Rfl:  .  timolol (TIMOPTIC) 0.5 % ophthalmic solution, Place 1 drop into both eyes at bedtime., Disp: , Rfl: 5  Review of Systems  Constitutional: Negative for appetite change, chills, fatigue and fever.  Respiratory: Negative for chest tightness and shortness of breath.   Cardiovascular: Negative for chest pain and palpitations.  Gastrointestinal: Negative for abdominal pain, nausea and vomiting.  Neurological: Negative for dizziness and weakness.    Social History   Tobacco Use  . Smoking status: Former Research scientist (life sciences)  . Smokeless tobacco: Never Used  . Tobacco comment: (08/29/15) quit 40 years ago  Substance Use Topics  . Alcohol use: Yes    Alcohol/week: 3.0 - 4.0 standard drinks      Types: 3 - 4 Glasses of wine per week      Objective:   There were no vitals taken for this visit. There were no vitals filed for this visit.   Physical Exam      Assessment & [place], PA-C  [facility]

## 2018-02-06 ENCOUNTER — Inpatient Hospital Stay: Payer: Self-pay | Admitting: Physician Assistant

## 2018-02-06 ENCOUNTER — Telehealth: Payer: Self-pay

## 2018-02-06 NOTE — Telephone Encounter (Signed)
Opened in error. KW °

## 2018-02-07 ENCOUNTER — Ambulatory Visit (INDEPENDENT_AMBULATORY_CARE_PROVIDER_SITE_OTHER): Payer: Medicare Other | Admitting: Physician Assistant

## 2018-02-07 ENCOUNTER — Encounter: Payer: Self-pay | Admitting: Physician Assistant

## 2018-02-07 VITALS — BP 134/80 | HR 79 | Temp 97.9°F | Resp 16 | Ht 67.0 in | Wt 126.0 lb

## 2018-02-07 DIAGNOSIS — I680 Cerebral amyloid angiopathy: Secondary | ICD-10-CM | POA: Diagnosis not present

## 2018-02-07 DIAGNOSIS — I611 Nontraumatic intracerebral hemorrhage in hemisphere, cortical: Secondary | ICD-10-CM

## 2018-02-07 DIAGNOSIS — I69320 Aphasia following cerebral infarction: Secondary | ICD-10-CM | POA: Diagnosis not present

## 2018-02-07 DIAGNOSIS — E854 Organ-limited amyloidosis: Secondary | ICD-10-CM | POA: Diagnosis not present

## 2018-02-07 NOTE — Progress Notes (Signed)
Patient: Courtney Willis Female    DOB: Jul 29, 1934   84 y.o.   MRN: [id number] Visit Date: 02/07/2018  Today's Provider: Mar Daring, PA-C   Chief Complaint  Patient presents with  . Hospitalization Follow-up   Subjective:     HPI    Follow up Hospitalization  Patient was admitted to [facility] on 01/13/2018 and discharged on 01/18/2018. She was treated for; ICH, suspected secondary to cerebral amyloid angiopathy. Treatment for this included; CT scan, MRI Brain scan, chest X-ray, EKG and Labs. . Telephone follow up was not done.  She reports good compliance with treatment. She reports this condition is Improved. ---------------------------------------------------------------  Patient states she feels somewhat better since discharge from hospital. She continues to reside in the higher care assistance and rehab facility of [place]. She is hopeful to be able to return to her own apartment there eventually.    No Known Allergies   Current Outpatient Medications:  .  acetaminophen (TYLENOL) 325 MG tablet, Take 2 tablets (650 mg total) by mouth every 4 (four) hours as needed for mild pain (or temp > 37.5 C (99.5 F))., Disp: , Rfl:  .  pantoprazole (PROTONIX) 40 MG tablet, Take 1 tablet (40 mg total) by mouth at bedtime., Disp: , Rfl:  .  pravastatin (PRAVACHOL) 20 MG tablet, Take 1 tablet (20 mg total) by mouth daily at 6 PM., Disp: , Rfl:  .  senna-docusate (SENOKOT-S) 8.6-50 MG tablet, Take 1 tablet by mouth 2 (two) times daily., Disp: , Rfl:  .  timolol (TIMOPTIC) 0.5 % ophthalmic solution, Place 1 drop into both eyes at bedtime., Disp: , Rfl: 5  Review of Systems  Constitutional: Negative for appetite change, chills, fatigue and fever.  Respiratory: Negative for chest tightness and shortness of breath.   Cardiovascular: Negative for chest pain and palpitations.  Gastrointestinal: Negative for abdominal pain, nausea and vomiting.  Neurological: Negative for  dizziness and weakness.    Social History   Tobacco Use  . Smoking status: Former Research scientist (life sciences)  . Smokeless tobacco: Never Used  . Tobacco comment: (08/29/15) quit 40 years ago  Substance Use Topics  . Alcohol use: Yes    Alcohol/week: 3.0 - 4.0 standard drinks    Types: 3 - 4 Glasses of wine per week      Objective:   BP 134/80 (BP Location: Right Arm, Patient Position: Sitting, Cuff Size: Normal)   Pulse 79   Temp 97.9 F (36.6 C) (Oral)   Resp 16   Ht 5\' 7"  (1.702 m)   Wt 126 lb (57.2 kg)   SpO2 98%   BMI 19.73 kg/m  Vitals:   02/07/18 1125  BP: 134/80  Pulse: 79  Resp: 16  Temp: 97.9 F (36.6 C)  TempSrc: Oral  SpO2: 98%  Weight: 126 lb (57.2 kg)  Height: 5\' 7"  (1.702 m)     Physical Exam Vitals signs reviewed.  Constitutional:      General: She is not in acute distress.    Appearance: She is well-developed. She is not diaphoretic.  Neck:     Musculoskeletal: Normal range of motion and neck supple.     Thyroid: No thyromegaly.     Vascular: No JVD.     Trachea: No tracheal deviation.  Cardiovascular:     Rate and Rhythm: Normal rate and regular rhythm.     Heart sounds: Normal heart sounds. No murmur. No friction rub. No gallop.   Pulmonary:  Effort: Pulmonary effort is normal. No respiratory distress.     Breath sounds: Normal breath sounds. No wheezing or rales.  Lymphadenopathy:     Cervical: No cervical adenopathy.  Neurological:     General: No focal deficit present.     Mental Status: She is oriented to person, place, and time.        Assessment & Plan    1. Nontraumatic cortical hemorrhage of left cerebral hemisphere [facility]) Improving. All hospitalization notes, records and imaging were reviewed by me prior to this visit. Continue rehabilitation as directed by [facility]. Continue pravastatin 20mg  daily. I will see her back in 3 months. Call if symptoms change or worsen.      Mar Daring, PA-C  [facility]

## 2018-02-13 ENCOUNTER — Encounter: Payer: Self-pay | Admitting: Physician Assistant

## 2018-02-13 DIAGNOSIS — E854 Organ-limited amyloidosis: Secondary | ICD-10-CM | POA: Insufficient documentation

## 2018-02-13 DIAGNOSIS — I680 Cerebral amyloid angiopathy: Secondary | ICD-10-CM

## 2018-02-24 ENCOUNTER — Ambulatory Visit: Payer: Medicare Other | Admitting: Physician Assistant

## 2018-02-27 ENCOUNTER — Encounter: Payer: Self-pay | Admitting: Physical Medicine & Rehabilitation

## 2018-02-27 ENCOUNTER — Telehealth: Payer: Self-pay

## 2018-02-27 ENCOUNTER — Ambulatory Visit: Payer: Medicare Other | Admitting: Physical Medicine & Rehabilitation

## 2018-02-27 ENCOUNTER — Encounter: Payer: Medicare Other | Attending: Physical Medicine & Rehabilitation

## 2018-02-27 VITALS — BP 144/83 | HR 80 | Ht 68.0 in | Wt 125.8 lb

## 2018-02-27 DIAGNOSIS — I619 Nontraumatic intracerebral hemorrhage, unspecified: Secondary | ICD-10-CM | POA: Diagnosis not present

## 2018-02-27 MED ORDER — PRAVASTATIN SODIUM 20 MG PO TABS: 20.0000 mg | ORAL_TABLET | Freq: Every day | ORAL | 1 refills | Status: DC

## 2018-02-27 NOTE — Progress Notes (Signed)
Subjective:    Patient ID: Courtney Willis, female    DOB: 1934/07/27, 84 y.o.   MRN: [id number]  84 year old right-handed female with history of hyperlipidemia, tobacco abuse,  presented 01/13/2018 with aphasia.  She lives alone, independent living facility at [facility] prior to admission and active.  One level home.   The patient found by a friend wandering about the house half dressed.     CT of the head showed  CT of the head showed left frontal lobe hemorrhage.  Per report, acute intracranial hemorrhage in the anterior superior frontal gyrus with extension to the left subarachnoid space.  Surrounding edema and regional mass effect with a  4 mm rightward midline shift.  She did not receive tPA.  MRI showed a large left frontal lobe intraparenchymal hemorrhage.  CT angiogram of head and neck with no significant stenosis or occlusion.  Follow up cranial CT scan stable.  Echocardiogram with  ejection fraction of 00%, grade I diastolic dysfunction.  Tolerating a regular diet.  The patient was admitted for a comprehensive rehabilitation program.  [place] daughter spent 1st 2 nites, no longer requires 24 hr Sup.  Family checks in daily.  Pt is not driving.  Family feels she is not ready due to cognition  Saw PCP  Lillia Abed PA No falls since at home. Dresing and bathing Mod I Amb without device  No fall  SLP twice a week and OT 3 times a week for another 2 weeks   Word finding still an issue  Poor focus Can do simple task but not multi step tasks Pain Inventory Average Pain 0 Pain Right Now 0 My pain is na  In the last 24 hours, has pain interfered with the following? General activity 0 Relation with others 0 Enjoyment of life 0 What TIME of day is your pain at its worst? na Sleep (in general) Good  Pain is worse with: na Pain improves with: na Relief from Meds: na  Mobility do you drive?  no  Function retired I need assistance with the following:  meal  prep  Neuro/Psych loss of taste or smell  Prior Studies Any changes since last visit?  no  Physicians involved in your care Any changes since last visit?  no Primary care Dr.Patel Neurologist Dr. Erlinda Hong   Family History  Problem Relation Age of Onset  . Rheum arthritis Mother   . Lung cancer Father   . Ulcers Father   . Hodgkin's lymphoma Sister   . AVM Daughter   . Breast cancer Paternal Grandmother    Social History   Socioeconomic History  . Marital status: Divorced    Spouse name: Not on file  . Number of children: 2  . Years of education: Not on file  . Highest education level: Master's degree (e.g., MA, MS, MEng, MEd, MSW, MBA)  Occupational History  . Occupation: retired  Scientific laboratory technician  . Financial resource strain: Not hard at all  . Food insecurity:    Worry: Never true    Inability: Never true  . Transportation needs:    Medical: No    Non-medical: No  Tobacco Use  . Smoking status: Former Research scientist (life sciences)  . Smokeless tobacco: Never Used  . Tobacco comment: (08/29/15) quit 40 years ago  Substance and Sexual Activity  . Alcohol use: Yes    Alcohol/week: 3.0 - 4.0 standard drinks    Types: 3 - 4 Glasses of wine per week  .  Drug use: No  . Sexual activity: Not on file  Lifestyle  . Physical activity:    Days per week: Not on file    Minutes per session: Not on file  . Stress: Not at all  Relationships  . Social connections:    Talks on phone: Not on file    Gets together: Not on file    Attends religious service: Not on file    Active member of club or organization: Not on file    Attends meetings of clubs or organizations: Not on file    Relationship status: Not on file  Other Topics Concern  . Not on file  Social History Narrative  . Not on file   Past Surgical History:  Procedure Laterality Date  . BUNIONECTOMY     05/2004, 2007  . CATARACT EXTRACTION     04/2005, 06/2005  . COLONOSCOPY W/ POLYPECTOMY    . COLONOSCOPY WITH PROPOFOL N/A 02/15/2017    Procedure: COLONOSCOPY WITH PROPOFOL;  Surgeon: Jonathon Bellows, MD;  Location: [facility] ENDOSCOPY;  Service: Gastroenterology;  Laterality: N/A;  . HYSTEROSCOPY  2011  . ORIF WRIST FRACTURE Right 08/30/2015   Procedure: OPEN REDUCTION INTERNAL FIXATION (ORIF) RIGHT WRIST FRACTURE AND REPAIR AS NECESSARY;  Surgeon: Roseanne Kaufman, MD;  Location: [facility];  Service: Orthopedics;  Laterality: Right;  . UPPER GI ENDOSCOPY    . WRIST FRACTURE SURGERY Left    06/2006   Past Medical History:  Diagnosis Date  . Acid reflux 02/26/2008  . Arthritis   . 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 Avitaminosis D 08/12/2014  . Bowel disease 02/16/2008  . Bunion 08/12/2014  . Cancer [facility])    Skin cancer- basal- upper arm , upper chest  . CN (constipation) 08/12/2014  . DD (diverticular disease) 02/16/2008  . Elevated liver enzymes 08/12/2014  . Fibroids, submucosal 08/12/2014   of her lower lip   . Hypercholesteremia 08/12/2014  . 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 Post menopausal syndrome 08/12/2014   BP (!) 144/83   Pulse 80   Ht 5\' 8"  (1.727 m)   Wt 125 lb 12.8 oz (57.1 kg)   SpO2 97%   BMI 19.13 kg/m   Opioid Risk Score:   Fall Risk Score:  `1  Depression screen PHQ 2/9  Depression screen [facility] 2/9 02/27/2018 07/13/2017 07/13/2017 07/01/2016 07/01/2016 07/08/2015  Decreased Interest 1 0 0 0 0 0  Down, Depressed, Hopeless 0 0 0 0 0 0  PHQ - 2 Score 1 0 0 0 0 0  Altered sleeping 1 1 - 0 - -  Tired, decreased energy 1 0 - 0 - -  Change in appetite 0 0 - 0 - -  Feeling bad or failure about yourself  0 0 - 0 - -  Trouble concentrating 1 0 - 0 - -  Moving slowly or fidgety/restless 0 0 - 0 - -  Suicidal thoughts 0 0 - 0 - -  PHQ-9 Score 4 1 - 0 - -  Difficult doing work/chores Somewhat difficult Not difficult at all - - - -    Review of Systems  Constitutional: Negative.   HENT: Negative.   Eyes: Negative.   Respiratory: Negative.   Cardiovascular: Negative.   Gastrointestinal: Positive for constipation.  Endocrine:  Negative.   Genitourinary: Negative.   Musculoskeletal: Negative.   Skin: Negative.   Allergic/Immunologic: Negative.   Neurological: Negative.   Hematological: Negative.   Psychiatric/Behavioral: Negative.   All other systems reviewed and are negative.  Objective:   Physical Exam Vitals signs and nursing note reviewed.  Constitutional:      Appearance: Normal appearance.  HENT:     Head: Normocephalic.     Nose: Nose normal.     Mouth/Throat:     Mouth: Mucous membranes are moist.  Eyes:     Extraocular Movements: Extraocular movements intact.     Pupils: Pupils are equal, round, and reactive to light.  Neck:     Musculoskeletal: Normal range of motion.  Musculoskeletal: Normal range of motion.  Skin:    General: Skin is warm and dry.  Neurological:     General: No focal deficit present.     Mental Status: She is alert and oriented to person, place, and time.     Sensory: Sensation is intact.     Motor: No weakness, tremor, atrophy or abnormal muscle tone.     Coordination: Rapid alternating movements normal.     Gait: Tandem walk abnormal. Gait normal.     Comments: Motor 5/5 in BUE and BLE, finger to thumb opposition intact bilaterally  Psychiatric:        Mood and Affect: Mood normal.        Behavior: Behavior normal.           Assessment & Plan:  1.  Left frontal ICH spontaneous without any underlying large vessel occulusion, per neuro possible cerebral amyloid angiopathy but no evidence of prior bleed Asked pt to discuss bleed recurrence risk with neuro  Asked about Lipid lowering agent ,, per neuro advised pravachol after D/C from hospital will call in   No driving until cleared by neuro or PCP  Pt to call once [facility] finished to get order for OP SLP at [place]  Discussed with pt and her daughter

## 2018-02-27 NOTE — Telephone Encounter (Signed)
-----   Message from Charlett Blake, MD sent at 02/27/2018  1:31 PM EST ----- Please notify pt that I ordered cholesterol lowering agent Pravachol , she can pick up at pharmacy, PCP will refill this

## 2018-02-27 NOTE — Telephone Encounter (Signed)
Called pt to let her know that Pravachol was called in for her.

## 2018-02-27 NOTE — Patient Instructions (Addendum)
Please call if you'd like a referral to Speech therapy  Ask neurologist about risk of recurrent stroke as well as about need for cholesterol lowering medication  No driving until cleared by PCP or neurologist

## 2018-03-01 ENCOUNTER — Ambulatory Visit: Payer: Self-pay | Admitting: Adult Health

## 2018-03-01 ENCOUNTER — Telehealth: Payer: Self-pay

## 2018-03-01 NOTE — Progress Notes (Deleted)
[facility] [address] [place]. [place] [address] [phone]       OFFICE FOLLOW UP NOTE  [name] Date of Birth:  30-Mar-1934 Medical Record Number:  [id number]   Reason for Referral:  hospital stroke follow up  CHIEF COMPLAINT:  No chief complaint on file.   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 History obtained from *** and chart review. Reviewed all radiology images and labs personally.  Courtney Willis an 84 y.o.femaleWith PMH of HLD, skin cancer who presented to [facility] ED as a code stroke for aphasia. Per EMS patient has missed phone callsall day. A friend knocked on patients door this morning at 1030 am and she did not answer. This afternoon about 1700 a friend came to take her to dinner and she was found wandering around the house half dressed. Unknown time of LSW. No prior history of stroke.CT head reviewed and showed an acute intra-axial hemorrhage anterior superiorleftfrontal gyrus (44 ml) with 4-5 mm of rightward midline shift. BP: 146/70 BG: 127. Modified Rankin:Rankin Score=0. NIHSS:10. Intracerebral Hemorrhage (ICH) Score:3. She was admitted to the neuro ICU for further evaluation and treatment.   MRI head reviewed and showed large left frontal lobe intraparenchymal hemorrhage with unchanged 4 mm rightward midline shift.  CTA head and neck unremarkable with stable hematoma and midline shift.  Repeat CT showed contracting of hematoma and no significant MLS.  Hemorrhage likely due to cerebral amyloid angiopathy therefore not recommended antithrombotics with avoidance lifelong.  LDL 94 and A1c 5.3.  BP stable without prior history of HTN and recommended BP goal normotensive range.  Initiated pravastatin 20 mg due to elevated LDL for HDL management.  Discharge to CIR for continued PT/OT/ST.    ROS:   14 system review of systems  performed and negative with exception of ***  PMH:  Past Medical History:  Diagnosis Date  . Acid reflux 02/26/2008  . Arthritis   . 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 Avitaminosis D 08/12/2014  . Bowel disease 02/16/2008  . Bunion 08/12/2014  . Cancer [facility])    Skin cancer- basal- upper arm , upper chest  . CN (constipation) 08/12/2014  . DD (diverticular disease) 02/16/2008  . Elevated liver enzymes 08/12/2014  . Fibroids, submucosal 08/12/2014   of her lower lip   . Hypercholesteremia 08/12/2014  . 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 Post menopausal syndrome 08/12/2014    PSH:  Past Surgical History:  Procedure Laterality Date  . BUNIONECTOMY     05/2004, 2007  . CATARACT EXTRACTION     04/2005, 06/2005  . COLONOSCOPY W/ POLYPECTOMY    . COLONOSCOPY WITH PROPOFOL N/A 02/15/2017   Procedure: COLONOSCOPY WITH PROPOFOL;  Surgeon: Jonathon Bellows, MD;  Location: [facility] ENDOSCOPY;  Service: Gastroenterology;  Laterality: N/A;  . HYSTEROSCOPY  2011  . ORIF WRIST FRACTURE Right 08/30/2015   Procedure: OPEN REDUCTION INTERNAL FIXATION (ORIF) RIGHT WRIST FRACTURE AND REPAIR AS NECESSARY;  Surgeon: Roseanne Kaufman, MD;  Location: [place];  Service: Orthopedics;  Laterality: Right;  . UPPER GI ENDOSCOPY    . WRIST FRACTURE SURGERY Left    06/2006    Social History:  Social History   Socioeconomic History  . Marital status: Divorced    Spouse name: Not on file  . Number of children: 2  . Years of education: Not on file  . Highest education  level: Master's degree (e.g., MA, MS, MEng, MEd, MSW, MBA)  Occupational History  . Occupation: retired  Scientific laboratory technician  . Financial resource strain: Not hard at all  . Food insecurity:    Worry: Never true    Inability: Never true  . Transportation needs:    Medical: No    Non-medical: No  Tobacco Use  . Smoking status: Former Research scientist (life sciences)  . Smokeless tobacco: Never Used  . Tobacco comment: (08/29/15) quit 40 years ago  Substance and Sexual Activity  . Alcohol use: Yes      Alcohol/week: 3.0 - 4.0 standard drinks    Types: 3 - 4 Glasses of wine per week  . Drug use: No  . Sexual activity: Not on file  Lifestyle  . Physical activity:    Days per week: Not on file    Minutes per session: Not on file  . Stress: Not at all  Relationships  . Social connections:    Talks on phone: Not on file    Gets together: Not on file    Attends religious service: Not on file    Active member of club or organization: Not on file    Attends meetings of clubs or organizations: Not on file    Relationship status: Not on file  . Intimate partner violence:    Fear of current or ex partner: Not on file    Emotionally abused: Not on file    Physically abused: Not on file    Forced sexual activity: Not on file  Other Topics Concern  . Not on file  Social History Narrative  . Not on file    Family History:  Family History  Problem Relation Age of Onset  . Rheum arthritis Mother   . Lung cancer Father   . Ulcers Father   . Hodgkin's lymphoma Sister   . AVM Daughter   . Breast cancer Paternal Grandmother     Medications:   Current Outpatient Medications on File Prior to Visit  Medication Sig Dispense Refill  . acetaminophen (TYLENOL) 325 MG tablet Take 2 tablets (650 mg total) by mouth every 4 (four) hours as needed for mild pain (or temp > 37.5 C (99.5 F)).    Marland Kitchen azelastine (ASTELIN) 0.1 % nasal spray Place into both nostrils 2 (two) times daily. Use in each nostril as directed as needed    . polyethylene glycol powder (GLYCOLAX/MIRALAX) powder Take 1 Container by mouth once.    . pravastatin (PRAVACHOL) 20 MG tablet Take 1 tablet (20 mg total) by mouth daily. 30 tablet 1  . timolol (TIMOPTIC) 0.5 % ophthalmic solution Place 1 drop into both eyes at bedtime.  5   No current facility-administered medications on file prior to visit.     Allergies:  No Known Allergies   Physical Exam  There were no vitals filed for this visit. There is no height or weight on  file to calculate BMI. No exam data present  General: well developed, well nourished, seated, in no evident distress Head: head normocephalic and atraumatic.   Neck: supple with no carotid or supraclavicular bruits Cardiovascular: regular rate and rhythm, no murmurs Musculoskeletal: no deformity Skin:  no rash/petichiae Vascular:  Normal pulses all extremities  Neurologic Exam Mental Status: Awake and fully alert. Oriented to place and time. Recent and remote memory intact. Attention span, concentration and fund of knowledge appropriate. Mood and affect appropriate.  Cranial Nerves: Fundoscopic exam reveals sharp disc margins. Pupils equal, briskly reactive  to light. Extraocular movements full without nystagmus. Visual fields full to confrontation. Hearing intact. Facial sensation intact. Face, tongue, palate moves normally and symmetrically.  Motor: Normal bulk and tone. Normal strength in all tested extremity muscles. Sensory.: intact to touch , pinprick , position and vibratory sensation.  Coordination: Rapid alternating movements normal in all extremities. Finger-to-nose and heel-to-shin performed accurately bilaterally. Gait and Station: Arises from chair without difficulty. Stance is normal. Gait demonstrates normal stride length and balance. Able to heel, toe and tandem walk without difficulty.  Reflexes: 1+ and symmetric. Toes downgoing.    NIHSS  *** Modified Rankin  ***   Diagnostic Data (Labs, Imaging, Testing)  Ct Head Code Stroke Wo Contrast 01/13/2018  1. Acute intra-axial hemorrhage in the anterior superior frontal gyrus with extension into the left subarachnoid space. Intra-axial hemorrhage volume estimated at 44 mL.  2.Surrounding edema and regional mass effect with 4-5 mm of rightward midline shift.  3. No intraventricular extension or ventriculomegaly.  4. ASPECTS is not applicable, acute hemorrhage.   Mr Brain Wo Contrast 01/13/2018 1. Large left frontal  lobe intraparenchymal hemorrhage trauma unchanged in size from earlier CT,allowing for differences in modality.  2. Unchanged 4 mm rightward midline shift.  3. Susceptibility effects from blood obscure diffusion-weighted imaging at the infarct site. No evidence of amyloid angiopathy.   Ct Angio Head W Or Wo Contrast 01/14/2018 1. No emergent large vessel occlusion, flow-limiting stenosis or vascular malformation. Complete circle-of-Willis.   Ct Angio Neck W Or Wo Contrast 01/14/2018 1. No hemodynamically significant stenosis ICA's.  2. Patent vertebral arteries.   Ct Head Wo Contrast 01/15/2018 1. Involving acute 33 cc LEFT frontal hematoma was 44 cc. Regional mass effect without midline shift. 2. Stable small volume LEFT frontal subarachnoid hemorrhage. Stable LEFT parietooccipital 4 mm subdural hematoma.   Dg Chest Port 1 View 01/13/2018 IMPRESSION:  Pulmonary hyperinflation suspected.No acute cardiopulmonary abnormality.     2D Echocardiogram - Left ventricle: The cavity size was normal. Systolic function wasvigorous. The estimated ejection fraction was in the range of 65%to 70%. Wall motion was normal; there were no regional wallmotion abnormalities. Doppler parameters are consistent withabnormal left ventricular relaxation (grade 1 diastolicdysfunction). - Aortic valve: There was mild regurgitation. - Left atrium: The atrium was moderately dilated. - Pulmonary arteries: Systolic pressure was mildly increased. PApeak pressure: 40 mm Hg (S). Impressions: No cardiac source of emboli was indentified.    ASSESSMENT: Courtney Willis is a 84 y.o. year old female here with large left frontal lobe intraparenchymal hemorrhage likely due to possible CAA on 01/13/2018. Vascular risk factors include HLD and possible CAA.     PLAN:  1. ICH: Continue pravastatin for secondary stroke prevention. Maintain strict control of hypertension with blood pressure goal below 130/90,  diabetes with hemoglobin A1c goal below 6.5% and cholesterol with LDL cholesterol (bad cholesterol) goal below 70 mg/dL.  I also advised the patient to eat a healthy diet with plenty of whole grains, cereals, fruits and vegetables, exercise regularly with at least 30 minutes of continuous activity daily and maintain ideal body weight. 2. Possible CAA: Likely cause of ICH has no history of HTN along with location of hemorrhage in lobar area.  Avoid antithrombotics lifelong due to risk of recurrent hemorrhage 3. HLD: Advised to continue current treatment regimen along with continued follow-up with PCP for future prescribing and monitoring of lipid panel    Follow up in *** or call earlier if needed   Greater than 50% of  time during this 25 minute visit was spent on counseling, explanation of diagnosis of ICH, reviewing risk factor management of HLD and CAA, planning of further management along with potential future management, and discussion with patient and family answering all questions.    Venancio Poisson, AGNP-BC  [facility] [address] [place], [place] [address]  Phone [phone] Fax [phone] Note: This document was prepared with digital dictation and possible smart phrase technology. Any transcriptional errors that result from this process are unintentional.

## 2018-03-01 NOTE — Telephone Encounter (Signed)
Patient was a no call/no show for their appointment today.   

## 2018-03-30 ENCOUNTER — Ambulatory Visit: Payer: Medicare Other | Admitting: Adult Health

## 2018-03-30 ENCOUNTER — Encounter: Payer: Self-pay | Admitting: Adult Health

## 2018-03-30 ENCOUNTER — Other Ambulatory Visit: Payer: Self-pay

## 2018-03-30 VITALS — BP 167/84 | HR 62 | Ht 68.0 in | Wt 128.0 lb

## 2018-03-30 DIAGNOSIS — R4701 Aphasia: Secondary | ICD-10-CM

## 2018-03-30 DIAGNOSIS — E854 Organ-limited amyloidosis: Secondary | ICD-10-CM | POA: Diagnosis not present

## 2018-03-30 DIAGNOSIS — I680 Cerebral amyloid angiopathy: Secondary | ICD-10-CM

## 2018-03-30 DIAGNOSIS — R030 Elevated blood-pressure reading, without diagnosis of hypertension: Secondary | ICD-10-CM | POA: Diagnosis not present

## 2018-03-30 DIAGNOSIS — I619 Nontraumatic intracerebral hemorrhage, unspecified: Secondary | ICD-10-CM

## 2018-03-30 DIAGNOSIS — E785 Hyperlipidemia, unspecified: Secondary | ICD-10-CM | POA: Diagnosis not present

## 2018-03-30 NOTE — Progress Notes (Signed)
I agree with the above plan 

## 2018-03-30 NOTE — Patient Instructions (Addendum)
Continue lipitor  for secondary stroke prevention  Avoid use of aspirin, ibuprofen and aspirin containing products - use Tylenol as needed for pain  Continue to follow up with PCP regarding cholesterol and blood pressure management   Continue speech therapy along with mind exercises   Continue to stay active and maintain a healthy diet  Start to monitor blood pressure at home with recording   Maintain strict control of hypertension with blood pressure goal below 130/90, diabetes with hemoglobin A1c goal below 6.5% and cholesterol with LDL cholesterol (bad cholesterol) goal below 70 mg/dL. I also advised the patient to eat a healthy diet with plenty of whole grains, cereals, fruits and vegetables, exercise regularly and maintain ideal body weight.  Followup in the future with me in 3 months or call earlier if needed       Thank you for coming to see [place] at [facility]. I hope we have been able to provide you high quality care today.  You may receive a patient satisfaction survey over the next few weeks. We would appreciate your feedback and comments so that we may continue to improve ourselves and the health of our patients.

## 2018-03-30 NOTE — Progress Notes (Signed)
[facility] [address] [place]. [place] [address] [phone]       OFFICE FOLLOW UP NOTE  [name] Date of Birth:  Nov 07, 1934 Medical Record Number:  [id number]   Reason for Referral:  hospital stroke follow up  CHIEF COMPLAINT:  Chief Complaint  Patient presents with  . New Patient (Initial Visit)    RM 9, stroke follow up. Doing well post stroke. With son, Catalina Antigua.    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 History obtained from patient, son and chart review. Reviewed all radiology images and labs personally.  Courtney Willis an 84 y.o.femaleWith PMH of HLD, skin cancer who presented to [facility] ED as a code stroke for aphasia. Per EMS patient has missed phone callsall day. A friend knocked on patients door this morning at 1030 am and she did not answer. This afternoon about 1700 a friend came to take her to dinner and she was found wandering around the house half dressed. Unknown time of LSW. No prior history of stroke.CT head reviewed and showed an acute intra-axial hemorrhage anterior superiorleftfrontal gyrus (44 ml) with 4-5 mm of rightward midline shift. BP: 146/70 BG: 127. Modified Rankin:Rankin Score=0. NIHSS:10. Intracerebral Hemorrhage (ICH) Score:3. She was admitted to the neuro ICU for further evaluation and treatment.   MRI head reviewed and showed large left frontal lobe intraparenchymal hemorrhage with unchanged 4 mm rightward midline shift.  CTA head and neck unremarkable with stable hematoma and midline shift.  Repeat CT showed contracting of hematoma and no significant MLS.  Hemorrhage likely due to cerebral amyloid angiopathy therefore not recommended antithrombotics with avoidance lifelong.  LDL 94 and A1c 5.3.  BP stable without prior history of HTN and recommended BP goal normotensive range.  Initiated pravastatin 20 mg due  to elevated LDL for HDL management.  Discharge to CIR for continued PT/OT/ST.  She is being seen today for hospital follow-up and is accompanied by her son.  She has been stable from a stroke standpoint with residual deficits of intermittent expressive aphasia and short-term memory difficulties.  Overall, she has been making an excellent recovery with only minimal deficits and continues to endorse improvement with ongoing outpatient speech therapy.  She currently resides at [facility] retirement community where she is able to maintain all ADLs and IADLs.  She has not returned to driving at this time but son is planning on having her undergo driving rehab once he feels though she is ready to return to driving.  Occasionally, she may have difficulty completing certain tasks with example of she will put one sock on and walk away forgetting to put the second sock on along with occasionally forgetting certain names or terms but otherwise no other memory/cognitive concerns and no other prior history of memory/cognitive concerns.  She has no prior history of stroke but does have family history of stroke (mother).  Denies family history of Alzheimer's disease or dementia.  She continues on pravastatin without side effects of myalgias.  Blood pressure today elevated at 167/84.  She does not currently monitor at home but she plans on stopping into the store on her way home to obtain blood pressure cuff.  No further concerns at this time.  Denies new or worsening stroke/TIA symptoms.   ROS:   14 system review of systems performed and negative with exception of easy bruising  PMH:  Past Medical History:  Diagnosis Date  . Acid reflux 02/26/2008  . Arthritis   . 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 Avitaminosis D 08/12/2014  . Bowel disease 02/16/2008  . Bunion 08/12/2014  . Cancer [facility])    Skin cancer- basal- upper arm , upper chest  . CN (constipation) 08/12/2014  . DD (diverticular disease) 02/16/2008  . Elevated  liver enzymes 08/12/2014  . Fibroids, submucosal 08/12/2014   of her lower lip   . Hypercholesteremia 08/12/2014  . 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 Post menopausal syndrome 08/12/2014    PSH:  Past Surgical History:  Procedure Laterality Date  . BUNIONECTOMY     05/2004, 2007  . CATARACT EXTRACTION     04/2005, 06/2005  . COLONOSCOPY W/ POLYPECTOMY    . COLONOSCOPY WITH PROPOFOL N/A 02/15/2017   Procedure: COLONOSCOPY WITH PROPOFOL;  Surgeon: Jonathon Bellows, MD;  Location: [facility] ENDOSCOPY;  Service: Gastroenterology;  Laterality: N/A;  . HYSTEROSCOPY  2011  . ORIF WRIST FRACTURE Right 08/30/2015   Procedure: OPEN REDUCTION INTERNAL FIXATION (ORIF) RIGHT WRIST FRACTURE AND REPAIR AS NECESSARY;  Surgeon: Roseanne Kaufman, MD;  Location: [facility];  Service: Orthopedics;  Laterality: Right;  . UPPER GI ENDOSCOPY    . WRIST FRACTURE SURGERY Left    06/2006    Social History:  Social History   Socioeconomic History  . Marital status: Divorced    Spouse name: Not on file  . Number of children: 2  . Years of education: Not on file  . Highest education level: Master's degree (e.g., MA, MS, MEng, MEd, MSW, MBA)  Occupational History  . Occupation: retired  Scientific laboratory technician  . Financial resource strain: Not hard at all  . Food insecurity:    Worry: Never true    Inability: Never true  . Transportation needs:    Medical: No    Non-medical: No  Tobacco Use  . Smoking status: Former Research scientist (life sciences)  . Smokeless tobacco: Never Used  . Tobacco comment: (08/29/15) quit 40 years ago  Substance and Sexual Activity  . Alcohol use: Yes    Alcohol/week: 3.0 - 4.0 standard drinks    Types: 3 - 4 Glasses of wine per week  . Drug use: No  . Sexual activity: Not on file  Lifestyle  . Physical activity:    Days per week: Not on file    Minutes per session: Not on file  . Stress: Not at all  Relationships  . Social connections:    Talks on phone: Not on file    Gets together: Not on file    Attends religious service:  Not on file    Active member of club or organization: Not on file    Attends meetings of clubs or organizations: Not on file    Relationship status: Not on file  . Intimate partner violence:    Fear of current or ex partner: Not on file    Emotionally abused: Not on file    Physically abused: Not on file    Forced sexual activity: Not on file  Other Topics Concern  . Not on file  Social History Narrative  . Not on file    Family History:  Family History  Problem Relation Age of Onset  . Rheum arthritis Mother   . Lung cancer Father   . Ulcers Father   . Hodgkin's lymphoma Sister   . AVM Daughter   . Breast cancer Paternal Grandmother     Medications:  Current Outpatient Medications on File Prior to Visit  Medication Sig Dispense Refill  . acetaminophen (TYLENOL) 325 MG tablet Take 2 tablets (650 mg total) by mouth every 4 (four) hours as needed for mild pain (or temp > 37.5 C (99.5 F)).    Marland Kitchen azelastine (ASTELIN) 0.1 % nasal spray Place into both nostrils 2 (two) times daily. Use in each nostril as directed as needed    . Calcium Carbonate-Vit D-Min (CALCIUM 1200 PO) Take by mouth.    . cholecalciferol (VITAMIN D3) 25 MCG (1000 UT) tablet Take 1,000 Units by mouth daily.    Marland Kitchen ibuprofen (ADVIL,MOTRIN) 100 MG tablet Take 100 mg by mouth.    . polyethylene glycol powder (GLYCOLAX/MIRALAX) powder Take 1 Container by mouth once.    . pravastatin (PRAVACHOL) 20 MG tablet Take 1 tablet (20 mg total) by mouth daily. 30 tablet 1  . timolol (TIMOPTIC) 0.5 % ophthalmic solution Place 1 drop into both eyes at bedtime.  5   No current facility-administered medications on file prior to visit.     Allergies:  No Known Allergies   Physical Exam  Vitals:   03/30/18 0720  BP: (!) 167/84  Pulse: 62  Weight: 128 lb (58.1 kg)  Height: 5\' 8"  (1.727 m)   Body mass index is 19.46 kg/m. No exam data present  General: well developed, well nourished, very pleasant elderly Caucasian  female, seated, in no evident distress Head: head normocephalic and atraumatic.   Neck: supple with no carotid or supraclavicular bruits Cardiovascular: regular rate and rhythm, no murmurs Musculoskeletal: Mild deformity of left hand due to prior reconstruction surgery Skin:  no rash/petichiae Vascular:  Normal pulses all extremities  Neurologic Exam Mental Status: Awake and fully alert.  Mild expressive aphasia.  Oriented to place and time. Recent and remote memory intact. Attention span, concentration and fund of knowledge appropriate. Mood and affect appropriate.  Cranial Nerves: Fundoscopic exam reveals sharp disc margins. Pupils equal, briskly reactive to light. Extraocular movements full without nystagmus. Visual fields full to confrontation. Hearing intact. Facial sensation intact. Face, tongue, palate moves normally and symmetrically.  Motor: Normal bulk and tone. Normal strength in all tested extremity muscles. Sensory.: intact to touch , pinprick , position and vibratory sensation.  Coordination: Rapid alternating movements normal in all extremities. Finger-to-nose and heel-to-shin performed accurately bilaterally. Gait and Station: Arises from chair without difficulty. Stance is normal. Gait demonstrates normal stride length and balance. Able to heel, toe and tandem walk without difficulty.  Reflexes: 1+ and symmetric. Toes downgoing.    NIHSS  1 Modified Rankin  2   Diagnostic Data (Labs, Imaging, Testing)  Ct Head Code Stroke Wo Contrast 01/13/2018  1. Acute intra-axial hemorrhage in the anterior superior frontal gyrus with extension into the left subarachnoid space. Intra-axial hemorrhage volume estimated at 44 mL.  2.Surrounding edema and regional mass effect with 4-5 mm of rightward midline shift.  3. No intraventricular extension or ventriculomegaly.  4. ASPECTS is not applicable, acute hemorrhage.   Mr Brain Wo Contrast 01/13/2018 1. Large left frontal lobe  intraparenchymal hemorrhage trauma unchanged in size from earlier CT,allowing for differences in modality.  2. Unchanged 4 mm rightward midline shift.  3. Susceptibility effects from blood obscure diffusion-weighted imaging at the infarct site. No evidence of amyloid angiopathy.   Ct Angio Head W Or Wo Contrast 01/14/2018 1. No emergent large vessel occlusion, flow-limiting stenosis or vascular malformation. Complete circle-of-Willis.   Ct Angio Neck W Or Wo Contrast 01/14/2018  1. No hemodynamically significant stenosis ICA's.  2. Patent vertebral arteries.   Ct Head Wo Contrast 01/15/2018 1. Involving acute 33 cc LEFT frontal hematoma was 44 cc. Regional mass effect without midline shift. 2. Stable small volume LEFT frontal subarachnoid hemorrhage. Stable LEFT parietooccipital 4 mm subdural hematoma.   Dg Chest Port 1 View 01/13/2018 IMPRESSION:  Pulmonary hyperinflation suspected.No acute cardiopulmonary abnormality.     2D Echocardiogram - Left ventricle: The cavity size was normal. Systolic function wasvigorous. The estimated ejection fraction was in the range of 65%to 70%. Wall motion was normal; there were no regional wallmotion abnormalities. Doppler parameters are consistent withabnormal left ventricular relaxation (grade 1 diastolicdysfunction). - Aortic valve: There was mild regurgitation. - Left atrium: The atrium was moderately dilated. - Pulmonary arteries: Systolic pressure was mildly increased. PApeak pressure: 40 mm Hg (S). Impressions: No cardiac source of emboli was indentified.    ASSESSMENT: Courtney Willis is a 84 y.o. year old female here with large left frontal lobe intraparenchymal hemorrhage likely due to possible CAA on 01/13/2018. Vascular risk factors include HLD and possible CAA.  She is being seen today for follow-up visit and has recovered well with only residual deficit of mild expressive aphasia and subjective mild cognitive/memory  deficits.    PLAN:  1. ICH: continue pravastatin for secondary stroke prevention. Maintain strict control of hypertension with blood pressure goal below 130/90, diabetes with hemoglobin A1c goal below 6.5% and cholesterol with LDL cholesterol (bad cholesterol) goal below 70 mg/dL.  I also advised the patient to eat a healthy diet with plenty of whole grains, cereals, fruits and vegetables, exercise regularly with at least 30 minutes of continuous activity daily and maintain ideal body weight. 2. Mild expressive aphasia: Continue participation in speech therapy 3. Mild memory/cognitive deficits: Continue participation in speech therapy along with performing mind exercises including suduku, crossword, word search, card games and reading.  Encouraged driving rehab due to concerns of multitasking and reaction time.  Advised her not to begin driving at this time unless cleared by driving rehab 4. Possible CAA: Likely cause of ICH has no history of HTN along with location of hemorrhage in lobar area.  Avoid antithrombotics lifelong due to risk of recurrent hemorrhage along with ensuring monitoring/management of blood pressure and HLD 5. HLD: Advised to continue current treatment regimen along with continued follow-up with PCP for future prescribing and monitoring of lipid panel 6. Elevated BP: Advised to start to monitor at home due to elevated BP at today's appointment.  Advised her that BP can be elevated at appointments despite normal levels at home and highly encouraged to start monitoring with recordings and to follow-up with PCP if remains elevated    Follow up in 3 months or call earlier if needed   Greater than 50% of time during this 25 minute visit was spent on counseling, explanation of diagnosis of ICH, reviewing risk factor management of HLD, blood pressure and CAA, planning of further management along with potential future management, and discussion with patient and family answering all  questions.    Venancio Poisson, AGNP-BC  [facility] [address] [place], [place] [address]  Phone [phone] Fax [phone] Note: This document was prepared with digital dictation and possible smart phrase technology. Any transcriptional errors that result from this process are unintentional.

## 2018-04-20 ENCOUNTER — Other Ambulatory Visit: Payer: Self-pay | Admitting: Physical Medicine & Rehabilitation

## 2018-04-20 DIAGNOSIS — Z8673 Personal history of transient ischemic attack (TIA), and cerebral infarction without residual deficits: Secondary | ICD-10-CM

## 2018-05-10 ENCOUNTER — Ambulatory Visit: Payer: Medicare Other | Admitting: Physician Assistant

## 2018-06-30 ENCOUNTER — Ambulatory Visit: Payer: Medicare Other | Admitting: Physician Assistant

## 2018-06-30 ENCOUNTER — Encounter: Payer: Self-pay | Admitting: Physician Assistant

## 2018-06-30 ENCOUNTER — Other Ambulatory Visit: Payer: Self-pay

## 2018-06-30 VITALS — BP 154/81 | HR 76 | Temp 98.2°F | Resp 16 | Wt 125.2 lb

## 2018-06-30 DIAGNOSIS — I872 Venous insufficiency (chronic) (peripheral): Secondary | ICD-10-CM

## 2018-06-30 DIAGNOSIS — I680 Cerebral amyloid angiopathy: Secondary | ICD-10-CM | POA: Diagnosis not present

## 2018-06-30 DIAGNOSIS — E854 Organ-limited amyloidosis: Secondary | ICD-10-CM

## 2018-06-30 NOTE — Patient Instructions (Signed)

## 2018-06-30 NOTE — Progress Notes (Signed)
Patient: Courtney Willis Female    DOB: 11/26/1934   84 y.o.   MRN: [id number] Visit Date: 06/30/2018  Today's Provider: Mar Daring, PA-C   Chief Complaint  Patient presents with  . Follow-up   Subjective:     HPI  Patient here for 3 month follow up nontraumatic cortical hemorrhage of left cerebral hemisphere. She was advised to continue with rehabilitation and continue Pravastatin 20 mg daily.Patient reports that she is feeling much better and almost back to normal.  She also c/o swelling on right foot, and she has some purple dots on her toes.   Patient would also like to have permission to drive again.  No Known Allergies   Current Outpatient Medications:  .  azelastine (ASTELIN) 0.1 % nasal spray, Place into both nostrils 2 (two) times daily. Use in each nostril as directed as needed, Disp: , Rfl:  .  Calcium Carbonate-Vit D-Min (CALCIUM 1200 PO), Take by mouth., Disp: , Rfl:  .  cholecalciferol (VITAMIN D3) 25 MCG (1000 UT) tablet, Take 1,000 Units by mouth daily., Disp: , Rfl:  .  polyethylene glycol powder (GLYCOLAX/MIRALAX) powder, Take 1 Container by mouth once., Disp: , Rfl:  .  pravastatin (PRAVACHOL) 20 MG tablet, TAKE 1 TABLET BY MOUTH EVERY DAY, Disp: 90 tablet, Rfl: 1 .  timolol (TIMOPTIC) 0.5 % ophthalmic solution, Place 1 drop into both eyes at bedtime., Disp: , Rfl: 5  Review of Systems  Constitutional: Negative.   Respiratory: Negative.   Cardiovascular: Negative.   Neurological: Negative.   Psychiatric/Behavioral: Negative.     Social History   Tobacco Use  . Smoking status: Former Research scientist (life sciences)  . Smokeless tobacco: Never Used  . Tobacco comment: (08/29/15) quit 40 years ago  Substance Use Topics  . Alcohol use: Yes    Alcohol/week: 3.0 - 4.0 standard drinks    Types: 3 - 4 Glasses of wine per week      Objective:   BP (!) 154/81 (BP Location: Left Arm, Patient Position: Sitting, Cuff Size: Large)   Pulse 76   Temp 98.2 F (36.8 C)  (Oral)   Resp 16   Wt 125 lb 3.2 oz (56.8 kg)   BMI 19.04 kg/m  Vitals:   06/30/18 0948  BP: (!) 154/81  Pulse: 76  Resp: 16  Temp: 98.2 F (36.8 C)  TempSrc: Oral  Weight: 125 lb 3.2 oz (56.8 kg)     Physical Exam Vitals signs reviewed.  Constitutional:      General: She is not in acute distress.    Appearance: Normal appearance. She is well-developed and normal weight. She is not ill-appearing or diaphoretic.  Neck:     Musculoskeletal: Normal range of motion and neck supple.     Thyroid: No thyromegaly.     Vascular: No JVD.     Trachea: No tracheal deviation.  Cardiovascular:     Rate and Rhythm: Normal rate and regular rhythm.     Pulses: Normal pulses.     Heart sounds: Normal heart sounds. No murmur. No friction rub. No gallop.   Pulmonary:     Effort: Pulmonary effort is normal. No respiratory distress.     Breath sounds: Normal breath sounds. No wheezing or rales.  Musculoskeletal:     Right lower leg: No edema.     Left lower leg: No edema.  Lymphadenopathy:     Cervical: No cervical adenopathy.  Skin:    Capillary Refill: Capillary refill  takes less than 2 seconds.  Neurological:     General: No focal deficit present.     Mental Status: She is alert.         Assessment & Plan    1. Venous insufficiency of both lower extremities Discoloration of legs and feet seem c/w venous insufficiency and venous stasis dermatitis. Discussed conservative management with compression stockings and elevating legs. Patient desires referral to vein clinic as well. Referral placed as below.  - Ambulatory referral to Vascular Surgery  2. Cerebral amyloid angiopathy (HCC) Improved after CVA. Desires to drive. She has scheduled f/u with neurosurgery in coming week. May be candidate for driving therapy prior to being allowed to drive alone. Exam today does not show any limitations for driving.      Mar Daring, PA-C  [facility]

## 2018-07-03 ENCOUNTER — Ambulatory Visit: Payer: Self-pay | Admitting: Physician Assistant

## 2018-07-03 ENCOUNTER — Telehealth: Payer: Self-pay

## 2018-07-03 ENCOUNTER — Other Ambulatory Visit: Payer: Self-pay | Admitting: Physician Assistant

## 2018-07-03 NOTE — Telephone Encounter (Signed)
Pt is currently at [facility] in [place] and no visitors are currently allowed , he states if its something simple she may can do it on her phone and their are staff at [facility] that can help her with it.Her son states.

## 2018-07-03 NOTE — Telephone Encounter (Signed)
Left vm for pts son Catalina Antigua that due to COVID 19 her appt will be change to video visit. We need verbal consent to do video and to file insurance. I left number to call back about instructions. If pt or son have a cell phone with camera that will be sufficient.

## 2018-07-04 NOTE — Telephone Encounter (Signed)
I called pt that her visit will be change to video due to COVID 19 pandemic. Pt gave verbal consent to do video and to file insurance. Pt has [employer] and [employer] as her carrier. Pt knows how to receive text messages. She was explain the doxy process and knows to click link 10 minutes prior to visit on 07/04/2018 at 1045am.

## 2018-07-04 NOTE — Telephone Encounter (Signed)
Link was text to pts cell phone at [phone].

## 2018-07-05 ENCOUNTER — Ambulatory Visit (INDEPENDENT_AMBULATORY_CARE_PROVIDER_SITE_OTHER): Payer: Medicare Other | Admitting: Adult Health

## 2018-07-05 ENCOUNTER — Encounter: Payer: Self-pay | Admitting: Adult Health

## 2018-07-05 ENCOUNTER — Other Ambulatory Visit: Payer: Self-pay

## 2018-07-05 DIAGNOSIS — I10 Essential (primary) hypertension: Secondary | ICD-10-CM | POA: Diagnosis not present

## 2018-07-05 DIAGNOSIS — E785 Hyperlipidemia, unspecified: Secondary | ICD-10-CM | POA: Diagnosis not present

## 2018-07-05 DIAGNOSIS — E854 Organ-limited amyloidosis: Secondary | ICD-10-CM | POA: Diagnosis not present

## 2018-07-05 DIAGNOSIS — I619 Nontraumatic intracerebral hemorrhage, unspecified: Secondary | ICD-10-CM | POA: Diagnosis not present

## 2018-07-05 DIAGNOSIS — I680 Cerebral amyloid angiopathy: Secondary | ICD-10-CM

## 2018-07-05 NOTE — Progress Notes (Signed)
[facility] [address] [place]. [place] [address] [phone]       FOLLOW UP NOTE  [name] Date of Birth:  1934/12/26 Medical Record Number:  [id number]   Reason for visit: Stroke follow up  Virtual Visit via Video Note  I connected with Courtney Willis on 07/05/18 at 10:45 AM EDT by a video enabled telemedicine application located remotely in my own home and verified that I am speaking with the correct person using two identifiers who was located at their own home.   I discussed the limitations of evaluation and management by telemedicine and the availability of in person appointments. The patient expressed understanding and agreed to proceed.    HPI: 07/05/18 VIRTUAL VISIT  Courtney Willis is 84 y.o. 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  Hospital summary and prior visit documented below for reference. She has been doing well from a stroke standpoint with residual deficits of mild memory deficit regarding short term and mild expressive aphasia. She has completed speech therapy as she met her goals. She continues to exercise daily with walking without recent falls. She continues to reside at [facility] retirement community. She discontinued use of statin by her PCP due to possible CAA with concern of increasing chance of recurrent stroke if continued on statin. PCP felt risk of statin outweighed benefit as cholesterol satisfactory. Blood pressure not routinely monitored at home as of recently.  No further concerns at this time.  Denies new or worsening stroke/TIA symptoms.      Initial visit 03/30/2018: Courtney Willis an 84 y.o.femaleWith PMH of HLD, skin cancer who presented to [facility] ED as a code stroke for aphasia. Per EMS patient has missed phone callsall day. A friend knocked on patients door this morning at 1030 am and she did not answer. This  afternoon about 1700 a friend came to take her to dinner and she was found wandering around the house half dressed. Unknown time of LSW. No prior history of stroke.CT head reviewed and showed an acute intra-axial hemorrhage anterior superiorleftfrontal gyrus (44 ml) with 4-5 mm of rightward midline shift. BP: 146/70 BG: 127. Modified Rankin:Rankin Score=0. NIHSS:10. Intracerebral Hemorrhage (ICH) Score:3. She was admitted to the neuro ICU for further evaluation and treatment.   MRI head reviewed and showed large left frontal lobe intraparenchymal hemorrhage with unchanged 4 mm rightward midline shift.  CTA head and neck unremarkable with stable hematoma and midline shift.  Repeat CT showed contracting of hematoma and no significant MLS.  Hemorrhage likely due to cerebral amyloid angiopathy therefore not recommended antithrombotics with avoidance lifelong.  LDL 94 and A1c 5.3.  BP stable without prior history of HTN and recommended BP goal normotensive range.  Initiated pravastatin 20 mg due to elevated LDL for HDL management.  Discharge to CIR for continued PT/OT/ST.  She is being seen today for hospital follow-up and is accompanied by her son.  She has been stable from a stroke standpoint with residual deficits of intermittent expressive aphasia and short-term memory difficulties.  Overall, she has been making an excellent recovery with only minimal deficits and continues to endorse improvement with ongoing outpatient speech therapy.  She currently resides at [facility] community where she is able to maintain all ADLs and IADLs.  She has not returned to driving at this time but son is planning on having her undergo  rehab once he feels though she is ready to return to driving.  Occasionally, she may have difficulty completing certain tasks with example of she will put one sock on and walk away forgetting to put the second sock on along with occasionally forgetting certain names or terms but  otherwise no other memory/cognitive concerns and no other prior history of memory/cognitive concerns.  She has no prior history of stroke but does have family history of stroke (mother).  Denies family history of Alzheimer's disease or dementia.  She continues on pravastatin without side effects of myalgias.  Blood pressure today elevated at 167/84.  She does not currently monitor at home but she plans on stopping into the store on her way home to obtain blood pressure cuff.  No further concerns at this time.  Denies new or worsening stroke/TIA symptoms. 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  °• Avitaminosis D 08/12/2014  °• Bowel disease 02/16/2008  °• Bunion 08/12/2014  °• Cancer (HCC)   ° Skin cancer- basal- upper arm , upper chest  °• CN (constipation) 08/12/2014  °• DD (diverticular disease) 02/16/2008  °• Elevated liver enzymes 08/12/2014  °• Fibroids, submucosal 08/12/2014  ° of her lower lip   °• Hypercholesteremia 08/12/2014  °• 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  °• Post menopausal syndrome 08/12/2014  ° ° °PSH:  °Past Surgical History:  °Procedure Laterality Date  °• BUNIONECTOMY    ° 05/2004, 2007  °• CATARACT EXTRACTION    ° 04/2005, 06/2005  °• COLONOSCOPY W/ POLYPECTOMY    °• COLONOSCOPY WITH PROPOFOL N/A 02/15/2017  ° Procedure: COLONOSCOPY WITH PROPOFOL;  Surgeon: Anna, Kiran, MD;  Location: [facility] ENDOSCOPY;  Service: Gastroenterology;  Laterality: N/A;  °• HYSTEROSCOPY  2011  °• ORIF WRIST FRACTURE Right 08/30/2015  ° Procedure: OPEN REDUCTION INTERNAL FIXATION (ORIF) RIGHT WRIST FRACTURE AND REPAIR AS NECESSARY;  Surgeon: William Gramig, MD;  Location: [facility] OR;  Service: Orthopedics;  Laterality: Right;  °• UPPER GI ENDOSCOPY    °• WRIST FRACTURE SURGERY Left   ° 06/2006  ° ° °Social History:  °Social History   ° °Socioeconomic History  °• Marital status: Divorced  °  Spouse name: Not on file  °• Number of children: 2  °• Years of education: Not on file  °• Highest education level: Master's Willis (e.g., MA, MS, MEng, MEd, MSW, MBA)  °Occupational History  °• Occupation: retired  °Social Needs  °• Financial resource strain: Not hard at all  °• Food insecurity:  °  Worry: Never true  °  Inability: Never true  °• Transportation needs:  °  Medical: No  °  Non-medical: No  °Tobacco Use  °• Smoking status: Former Smoker  °• Smokeless tobacco: Never Used  °• Tobacco comment: (08/29/15) quit 40 years ago  °Substance and Sexual Activity  °• Alcohol use: Yes  °  Alcohol/week: 3.0 - 4.0 standard drinks  °  Types: 3 - 4 Glasses of wine per week  °• Drug use: No  °• Sexual activity: Not on file  °Lifestyle  °• Physical activity:  °  Days per week: Not on file  °  Minutes per session: Not on file  °• Stress: Not at all  °Relationships  °• Social connections:  °    connections:    Talks on phone: Not on file    Gets together: Not on file    Attends religious service: Not on file    Active member of club or organization: Not on file    Attends meetings of clubs or organizations: Not on file    Relationship status: Not on file   Intimate partner violence:    Fear of current or ex partner: Not on file    Emotionally abused: Not on file    Physically abused: Not on file    Forced sexual activity: Not on file  Other Topics Concern   Not on file  Social History Narrative   Not on file    Family History:  Family History  Problem Relation Age of Onset   Rheum arthritis Mother    Lung cancer Father    Ulcers Father    Hodgkin's lymphoma Sister    AVM Daughter    Breast cancer Paternal Grandmother     Medications:   Current Outpatient Medications on File Prior to Visit  Medication Sig Dispense Refill   azelastine (ASTELIN) 0.1 % nasal spray PLACE 1 SPRAY INTO THE NOSE 2 (TWO) TIMES DAILY. 30 mL 1   Calcium Carbonate-Vit  D-Min (CALCIUM 1200 PO) Take by mouth.     cholecalciferol (VITAMIN D3) 25 MCG (1000 UT) tablet Take 1,000 Units by mouth daily.     polyethylene glycol powder (GLYCOLAX/MIRALAX) powder Take 1 Container by mouth once.     pravastatin (PRAVACHOL) 20 MG tablet TAKE 1 TABLET BY MOUTH EVERY DAY 90 tablet 1   timolol (TIMOPTIC) 0.5 % ophthalmic solution Place 1 drop into both eyes at bedtime.  5   No current facility-administered medications on file prior to visit.     Allergies:  No Known Allergies   Physical Exam  General: well developed, well nourished, very pleasant elderly Caucasian female, seated, in no evident distress Head: head normocephalic and atraumatic.    Neurologic Exam Mental Status: Awake and fully alert. Unable to appreciate speech difficulties during conversation. Oriented to place and time. Recent and remote memory intact. Attention span, concentration and fund of knowledge appropriate. Mood and affect appropriate.  Cranial Nerves: Extraocular movements full without nystagmus. Hearing intact to voice. Facial sensation intact. Face, tongue, palate moves normally and symmetrically.  Motor: no evidence of weakness per drift assessment Sensory.: intact to light touch Coordination: Rapid alternating movements normal in all extremities. Finger-to-nose and heel-to-shin performed accurately bilaterally. Gait and Station: Arises from chair without difficulty. Stance is normal. Gait demonstrates normal stride length and balance.  Reflexes: UTA      Diagnostic Data (Labs, Imaging, Testing)  Ct Head Code Stroke Wo Contrast 01/13/2018  1. Acute intra-axial hemorrhage in the anterior superior frontal gyrus with extension into the left subarachnoid space. Intra-axial hemorrhage volume estimated at 44 mL.  2.Surrounding edema and regional mass effect with 4-5 mm of rightward midline shift.  3. No intraventricular extension or ventriculomegaly.  4. ASPECTS is not applicable,  acute hemorrhage.   Mr Brain Wo Contrast 01/13/2018 1. Large left frontal lobe intraparenchymal hemorrhage trauma unchanged in size from earlier CT,allowing for differences in modality.  2. Unchanged 4 mm rightward midline shift.  3. Susceptibility effects from blood obscure diffusion-weighted imaging at the infarct site. No evidence of amyloid angiopathy.   Ct Angio Head W Or Wo Contrast 01/14/2018 1. No emergent large vessel occlusion, flow-limiting stenosis or vascular malformation. Complete circle-of-Willis.   Ct Angio Neck W Or  Wo Contrast 01/14/2018 1. No hemodynamically significant stenosis ICA's.  2. Patent vertebral arteries.   Ct Head Wo Contrast 01/15/2018 1. Involving acute 33 cc LEFT frontal hematoma was 44 cc. Regional mass effect without midline shift. 2. Stable small volume LEFT frontal subarachnoid hemorrhage. Stable LEFT parietooccipital 4 mm subdural hematoma.   Dg Chest Port 1 View 01/13/2018 IMPRESSION:  Pulmonary hyperinflation suspected.No acute cardiopulmonary abnormality.     2D Echocardiogram - Left ventricle: The cavity size was normal. Systolic function wasvigorous. The estimated ejection fraction was in the range of 65%to 70%. Wall motion was normal; there were no regional wallmotion abnormalities. Doppler parameters are consistent withabnormal left ventricular relaxation (grade 1 diastolicdysfunction). - Aortic valve: There was mild regurgitation. - Left atrium: The atrium was moderately dilated. - Pulmonary arteries: Systolic pressure was mildly increased. PApeak pressure: 40 mm Hg (S). Impressions: No cardiac source of emboli was indentified.    ASSESSMENT: Courtney Willis is a 84 y.o. year old female here with large left frontal lobe intraparenchymal hemorrhage likely due to possible CAA on 01/13/2018. Vascular risk factors include HLD and possible CAA.  She continues to do well from a stroke standpoint with residual deficits of mild  expressive aphasia and mild memory loss.    PLAN:  1. ICH: Maintain strict control of hypertension with blood pressure goal below 130/90, diabetes with hemoglobin A1c goal below 6.5% and cholesterol with LDL cholesterol (bad cholesterol) goal below 70 mg/dL.  I also advised the patient to eat a healthy diet with plenty of whole grains, cereals, fruits and vegetables, exercise regularly with at least 30 minutes of continuous activity daily and maintain ideal body weight.  2. Possible CAA: Likely cause of ICH and advised to monitor blood pressure at home to ensure satisfactory levels. PCP recently discontinued use of statin as levels have been satisfactory and risk of continued use of statin with CAA outweigh benefit.  Advised patient of neck studies regarding this concern but advised to continue to follow with PCP in regards to ongoing lipid panel monitoring and if LDL greater than 70, consider initiating low-dose statin 3. HLD: Advised to continue current treatment regimen along with continued follow-up with PCP for future prescribing and monitoring of lipid panel 4. HTN: advised to monitor at home and ongoing follow-up with PCP    Follow up in 6 months or call earlier if needed   Greater than 50% of time during this 25 minute non-face-to-face visit was spent on counseling, explanation of diagnosis of ICH, reviewing risk factor management of HLD, blood pressure and CAA, planning of further management along with potential future management, and discussion with patient and family answering all questions.    Venancio Poisson, AGNP-BC  [facility] [address] [place], [place] [address]  Phone [phone] Fax [phone] Note: This document was prepared with digital dictation and possible smart phrase technology. Any transcriptional errors that result from this process are unintentional.

## 2018-07-05 NOTE — Progress Notes (Signed)
I agree with the above plan 

## 2018-07-18 ENCOUNTER — Ambulatory Visit: Payer: Medicare Other

## 2018-07-24 ENCOUNTER — Telehealth: Payer: Self-pay

## 2018-07-24 ENCOUNTER — Encounter (INDEPENDENT_AMBULATORY_CARE_PROVIDER_SITE_OTHER): Payer: Self-pay | Admitting: Vascular Surgery

## 2018-07-24 ENCOUNTER — Ambulatory Visit (INDEPENDENT_AMBULATORY_CARE_PROVIDER_SITE_OTHER): Payer: Medicare Other | Admitting: Vascular Surgery

## 2018-07-24 ENCOUNTER — Other Ambulatory Visit: Payer: Self-pay

## 2018-07-24 VITALS — BP 184/83 | HR 76 | Resp 16 | Ht 68.0 in | Wt 125.8 lb

## 2018-07-24 DIAGNOSIS — I872 Venous insufficiency (chronic) (peripheral): Secondary | ICD-10-CM

## 2018-07-24 DIAGNOSIS — I8311 Varicose veins of right lower extremity with inflammation: Secondary | ICD-10-CM | POA: Diagnosis not present

## 2018-07-24 DIAGNOSIS — E785 Hyperlipidemia, unspecified: Secondary | ICD-10-CM

## 2018-07-24 DIAGNOSIS — K219 Gastro-esophageal reflux disease without esophagitis: Secondary | ICD-10-CM

## 2018-07-24 DIAGNOSIS — I8312 Varicose veins of left lower extremity with inflammation: Secondary | ICD-10-CM

## 2018-07-24 NOTE — Telephone Encounter (Signed)
Encounter opened in error. KW 

## 2018-07-25 ENCOUNTER — Ambulatory Visit (INDEPENDENT_AMBULATORY_CARE_PROVIDER_SITE_OTHER): Payer: Medicare Other

## 2018-07-25 DIAGNOSIS — Z0000 Encounter for general adult medical examination without abnormal findings: Secondary | ICD-10-CM | POA: Diagnosis not present

## 2018-07-25 NOTE — Progress Notes (Signed)
Patient: Courtney Willis Female    DOB: 1934-12-10   84 y.o.   MRN: [id number] Visit Date: 07/26/2018  Today's Provider: Mar Daring, PA-C   Chief Complaint  Patient presents with  . Hypertension   Subjective:     HPI   Patient is here to discuss her blood pressure. Patient states her home readings are up and down. Her last home reading was around 180/80. Patient states her bp has up been like this for the past year. She has not been having any symptoms of chest pain, headache, visual changes, dizziness, SOB.   No Known Allergies   Current Outpatient Medications:  .  acetaminophen (TYLENOL) 500 MG tablet, Take 500 mg by mouth every 6 (six) hours as needed., Disp: , Rfl:  .  azelastine (ASTELIN) 0.1 % nasal spray, PLACE 1 SPRAY INTO THE NOSE 2 (TWO) TIMES DAILY. (Patient taking differently: Place 2 sprays into both nostrils as needed. ), Disp: 30 mL, Rfl: 1 .  Calcium Carbonate-Vit D-Min (CALCIUM 1200 PO), Take by mouth daily. , Disp: , Rfl:  .  chlorpheniramine (ALLERGY RELIEF) 4 MG tablet, Take 4 mg by mouth as needed for allergies., Disp: , Rfl:  .  cholecalciferol (VITAMIN D3) 25 MCG (1000 UT) tablet, Take 1,000 Units by mouth daily., Disp: , Rfl:  .  fluticasone (FLONASE) 50 MCG/ACT nasal spray, Place 2 sprays into both nostrils daily. , Disp: , Rfl:  .  Polyethyl Glycol-Propyl Glycol (SYSTANE) 0.4-0.3 % SOLN, Apply to eye as needed. , Disp: , Rfl:  .  polyethylene glycol powder (GLYCOLAX/MIRALAX) powder, Take 1 Container by mouth as needed. , Disp: , Rfl:  .  Propylene Glycol (SYSTANE BALANCE) 0.6 % SOLN, Apply to eye as needed., Disp: , Rfl:  .  timolol (TIMOPTIC) 0.5 % ophthalmic solution, Place 1 drop into both eyes at bedtime., Disp: , Rfl: 5 .  pravastatin (PRAVACHOL) 20 MG tablet, TAKE 1 TABLET BY MOUTH EVERY DAY (Patient not taking: Reported on 07/24/2018), Disp: 90 tablet, Rfl: 1  Review of Systems  Constitutional: Negative for appetite change, chills,  fatigue and fever.  Eyes: Negative for visual disturbance.  Respiratory: Negative for chest tightness and shortness of breath.   Cardiovascular: Negative for chest pain and palpitations.  Gastrointestinal: Negative for abdominal pain, nausea and vomiting.  Neurological: Negative for dizziness and weakness.    Social History   Tobacco Use  . Smoking status: Former Research scientist (life sciences)  . Smokeless tobacco: Never Used  . Tobacco comment: (08/29/15) quit 40 years ago  Substance Use Topics  . Alcohol use: Yes    Alcohol/week: 3.0 - 4.0 standard drinks    Types: 3 - 4 Glasses of wine per week      Objective:   BP (!) 163/84 (BP Location: Left Arm, Patient Position: Sitting, Cuff Size: Normal)   Pulse 66   Temp (!) 97.5 F (36.4 C) (Oral)   Resp 16   Ht 5\' 8"  (1.727 m)   Wt 126 lb (57.2 kg)   SpO2 98%   BMI 19.16 kg/m  Vitals:   07/26/18 1029  BP: (!) 163/84  Pulse: 66  Resp: 16  Temp: (!) 97.5 F (36.4 C)  TempSrc: Oral  SpO2: 98%  Weight: 126 lb (57.2 kg)  Height: 5\' 8"  (1.727 m)     Physical Exam Vitals signs reviewed.  Constitutional:      General: She is not in acute distress.    Appearance: Normal appearance.  She is well-developed. She is not ill-appearing or diaphoretic.  Neck:     Musculoskeletal: Normal range of motion and neck supple.     Thyroid: No thyromegaly.     Vascular: No JVD.     Trachea: No tracheal deviation.  Cardiovascular:     Rate and Rhythm: Normal rate and regular rhythm.     Pulses: Normal pulses.     Heart sounds: Normal heart sounds. No murmur. No friction rub. No gallop.   Pulmonary:     Effort: Pulmonary effort is normal. No respiratory distress.     Breath sounds: Normal breath sounds. No wheezing or rales.  Musculoskeletal: Normal range of motion.  Lymphadenopathy:     Cervical: No cervical adenopathy.  Neurological:     General: No focal deficit present.     Mental Status: She is alert.  Psychiatric:        Mood and Affect: Mood  normal.        Behavior: Behavior normal.        Thought Content: Thought content normal.        Judgment: Judgment normal.     No results found for any visits on 07/26/18.     Assessment & Plan    1. Essential hypertension Start Maxzide as below. Return in 4 weeks for recheck. Will check labs (BMP) when she returns. - triamterene-hydrochlorothiazide (MAXZIDE-25) 37.5-25 MG tablet; Take 1 tablet by mouth daily.  Dispense: 30 tablet; Refill: [place], PA-C  [facility]

## 2018-07-25 NOTE — Patient Instructions (Signed)
Courtney Willis , Thank you for taking time to come for your Medicare Wellness Visit. I appreciate your ongoing commitment to your health goals. Please review the following plan we discussed and let me know if I can assist you in the future.   Screening recommendations/referrals: Colonoscopy: No longer required.  Mammogram: No longer required.  Bone Density: Up to date, due 11/2018 Recommended yearly ophthalmology/optometry visit for glaucoma screening and checkup Recommended yearly dental visit for hygiene and checkup  Vaccinations: Influenza vaccine: Up to date Pneumococcal vaccine: Completed series Tdap vaccine: Up to date, due 08/2025 Shingles vaccine: Completed series    Advanced directives: Currently on file.   Conditions/risks identified: Continue to increase water intake to 6-8 8 oz glasses a day.   Next appointment: 07/26/18 with Fenton Malling.    Preventive Care 1 Years and Older, Female Preventive care refers to lifestyle choices and visits with your health care provider that can promote health and wellness. What does preventive care include?  A yearly physical exam. This is also called an annual well check.  Dental exams once or twice a year.  Routine eye exams. Ask your health care provider how often you should have your eyes checked.  Personal lifestyle choices, including:  Daily care of your teeth and gums.  Regular physical activity.  Eating a healthy diet.  Avoiding tobacco and drug use.  Limiting alcohol use.  Practicing safe sex.  Taking low-dose aspirin every day.  Taking vitamin and mineral supplements as recommended by your health care provider. What happens during an annual well check? The services and screenings done by your health care provider during your annual well check will depend on your age, overall health, lifestyle risk factors, and family history of disease. Counseling  Your health care provider may ask you questions about your:   Alcohol use.  Tobacco use.  Drug use.  Emotional well-being.  Home and relationship well-being.  Sexual activity.  Eating habits.  History of falls.  Memory and ability to understand (cognition).  Work and work Statistician.  Reproductive health. Screening  You may have the following tests or measurements:  Height, weight, and BMI.  Blood pressure.  Lipid and cholesterol levels. These may be checked every 5 years, or more frequently if you are over 38 years old.  Skin check.  Lung cancer screening. You may have this screening every year starting at age 82 if you have a 30-pack-year history of smoking and currently smoke or have quit within the past 15 years.  Fecal occult blood test (FOBT) of the stool. You may have this test every year starting at age 24.  Flexible sigmoidoscopy or colonoscopy. You may have a sigmoidoscopy every 5 years or a colonoscopy every 10 years starting at age 49.  Hepatitis C blood test.  Hepatitis B blood test.  Sexually transmitted disease (STD) testing.  Diabetes screening. This is done by checking your blood sugar (glucose) after you have not eaten for a while (fasting). You may have this done every 1-3 years.  Bone density scan. This is done to screen for osteoporosis. You may have this done starting at age 38.  Mammogram. This may be done every 1-2 years. Talk to your health care provider about how often you should have regular mammograms. Talk with your health care provider about your test results, treatment options, and if necessary, the need for more tests. Vaccines  Your health care provider may recommend certain vaccines, such as:  Influenza vaccine. This is recommended  every year.  Tetanus, diphtheria, and acellular pertussis (Tdap, Td) vaccine. You may need a Td booster every 10 years.  Zoster vaccine. You may need this after age 38.  Pneumococcal 13-valent conjugate (PCV13) vaccine. One dose is recommended after age 74.   Pneumococcal polysaccharide (PPSV23) vaccine. One dose is recommended after age 89. Talk to your health care provider about which screenings and vaccines you need and how often you need them. This information is not intended to replace advice given to you by your health care provider. Make sure you discuss any questions you have with your health care provider. Document Released: 02/14/2015 Document Revised: 10/08/2015 Document Reviewed: 11/19/2014 Elsevier Interactive Patient Education  2017 [place] Prevention in the Home Falls can cause injuries. They can happen to people of all ages. There are many things you can do to make your home safe and to help prevent falls. What can I do on the outside of my home?  Regularly fix the edges of walkways and driveways and fix any cracks.  Remove anything that might make you trip as you walk through a door, such as a raised step or threshold.  Trim any bushes or trees on the path to your home.  Use bright outdoor lighting.  Clear any walking paths of anything that might make someone trip, such as rocks or tools.  Regularly check to see if handrails are loose or broken. Make sure that both sides of any steps have handrails.  Any raised decks and porches should have guardrails on the edges.  Have any leaves, snow, or ice cleared regularly.  Use sand or salt on walking paths during winter.  Clean up any spills in your garage right away. This includes oil or grease spills. What can I do in the bathroom?  Use night lights.  Install grab bars by the toilet and in the tub and shower. Do not use towel bars as grab bars.  Use non-skid mats or decals in the tub or shower.  If you need to sit down in the shower, use a plastic, non-slip stool.  Keep the floor dry. Clean up any water that spills on the floor as soon as it happens.  Remove soap buildup in the tub or shower regularly.  Attach bath mats securely with double-sided  non-slip rug tape.  Do not have throw rugs and other things on the floor that can make you trip. What can I do in the bedroom?  Use night lights.  Make sure that you have a light by your bed that is easy to reach.  Do not use any sheets or blankets that are too big for your bed. They should not hang down onto the floor.  Have a firm chair that has side arms. You can use this for support while you get dressed.  Do not have throw rugs and other things on the floor that can make you trip. What can I do in the kitchen?  Clean up any spills right away.  Avoid walking on wet floors.  Keep items that you use a lot in easy-to-reach places.  If you need to reach something above you, use a strong step stool that has a grab bar.  Keep electrical cords out of the way.  Do not use floor polish or wax that makes floors slippery. If you must use wax, use non-skid floor wax.  Do not have throw rugs and other things on the floor that can make you trip. What  can I do with my stairs?  Do not leave any items on the stairs.  Make sure that there are handrails on both sides of the stairs and use them. Fix handrails that are broken or loose. Make sure that handrails are as long as the stairways.  Check any carpeting to make sure that it is firmly attached to the stairs. Fix any carpet that is loose or worn.  Avoid having throw rugs at the top or bottom of the stairs. If you do have throw rugs, attach them to the floor with carpet tape.  Make sure that you have a light switch at the top of the stairs and the bottom of the stairs. If you do not have them, ask someone to add them for you. What else can I do to help prevent falls?  Wear shoes that:  Do not have high heels.  Have rubber bottoms.  Are comfortable and fit you well.  Are closed at the toe. Do not wear sandals.  If you use a stepladder:  Make sure that it is fully opened. Do not climb a closed stepladder.  Make sure that both  sides of the stepladder are locked into place.  Ask someone to hold it for you, if possible.  Clearly mark and make sure that you can see:  Any grab bars or handrails.  First and last steps.  Where the edge of each step is.  Use tools that help you move around (mobility aids) if they are needed. These include:  Canes.  Walkers.  Scooters.  Crutches.  Turn on the lights when you go into a dark area. Replace any light bulbs as soon as they burn out.  Set up your furniture so you have a clear path. Avoid moving your furniture around.  If any of your floors are uneven, fix them.  If there are any pets around you, be aware of where they are.  Review your medicines with your doctor. Some medicines can make you feel dizzy. This can increase your chance of falling. Ask your doctor what other things that you can do to help prevent falls. This information is not intended to replace advice given to you by your health care provider. Make sure you discuss any questions you have with your health care provider. Document Released: 11/14/2008 Document Revised: 06/26/2015 Document Reviewed: 02/22/2014 Elsevier Interactive Patient Education  2017 [employer].

## 2018-07-25 NOTE — Progress Notes (Signed)
Subjective:   Courtney Willis is a 84 y.o. female who presents for Medicare Annual (Subsequent) preventive examination.    This visit is being conducted through telemedicine due to the COVID-19 pandemic. This patient has given me verbal consent via doximity to conduct this visit, patient states they are participating from their home address. Some vital signs may be absent or patient reported.    Patient identification: identified by name, DOB, and current address  Review of Systems:  N/A  Cardiac Risk Factors include: advanced age (>79men, >52 women);dyslipidemia     Objective:     Vitals: There were no vitals taken for this visit.  There is no height or weight on file to calculate BMI. Unable to obtain vitals due to visit being conducted via telephonically.   Advanced Directives 07/25/2018 01/18/2018 01/14/2018 07/13/2017 07/01/2016 08/26/2015 07/08/2015  Does Patient Have a Medical Advance Directive? Yes No No Yes Yes Yes Yes  Type of Paramedic of [place];Living will - - [place];Living will Living will;Healthcare Power of [place];Living will [place];Living will  Does patient want to make changes to medical advance directive? - - - - - - -  Copy of [place] in Chart? No - copy requested - - No - copy requested Yes - -  Would patient like information on creating a medical advance directive? - No - Patient declined No - Patient declined - - No - patient declined information -    Tobacco Social History   Tobacco Use  Smoking Status Former Smoker  Smokeless Tobacco Never Used  Tobacco Comment   (08/29/15) quit 40 years ago     Counseling given: Not Answered Comment: (08/29/15) quit 40 years ago   Clinical Intake:  Pre-visit preparation completed: Yes  Pain : No/denies pain Pain Score: 0-No pain     Diabetes: No  How often do you need to have someone help you  when you read instructions, pamphlets, or other written materials from your doctor or pharmacy?: 1 - Never  Interpreter Needed?: No  Information entered by :: [facility], LPN  Past Medical History:  Diagnosis Date  . Acid reflux 02/26/2008  . Arthritis   . 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 Avitaminosis D 08/12/2014  . Bowel disease 02/16/2008  . Bunion 08/12/2014  . Cancer [facility])    Skin cancer- basal- upper arm , upper chest  . CN (constipation) 08/12/2014  . DD (diverticular disease) 02/16/2008  . Elevated liver enzymes 08/12/2014  . Fibroids, submucosal 08/12/2014   of her lower lip   . Hypercholesteremia 08/12/2014  . 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 Post menopausal syndrome 08/12/2014  . Stroke [facility])    Past Surgical History:  Procedure Laterality Date  . BUNIONECTOMY     05/2004, 2007  . CATARACT EXTRACTION     04/2005, 06/2005  . COLONOSCOPY W/ POLYPECTOMY    . COLONOSCOPY WITH PROPOFOL N/A 02/15/2017   Procedure: COLONOSCOPY WITH PROPOFOL;  Surgeon: Jonathon Bellows, MD;  Location: [facility] ENDOSCOPY;  Service: Gastroenterology;  Laterality: N/A;  . HYSTEROSCOPY  2011  . ORIF WRIST FRACTURE Right 08/30/2015   Procedure: OPEN REDUCTION INTERNAL FIXATION (ORIF) RIGHT WRIST FRACTURE AND REPAIR AS NECESSARY;  Surgeon: Roseanne Kaufman, MD;  Location: [place];  Service: Orthopedics;  Laterality: Right;  . UPPER GI ENDOSCOPY    . WRIST FRACTURE SURGERY Left    06/2006   Family History  Problem Relation Age of Onset  .  Rheum arthritis Mother   . Lung cancer Father   . Ulcers Father   . Hodgkin's lymphoma Sister   . AVM Daughter   . Breast cancer Paternal Grandmother    Social History   Socioeconomic History  . Marital status: Divorced    Spouse name: Not on file  . Number of children: 2  . Years of education: Not on file  . Highest education level: Master's degree (e.g., MA, MS, MEng, MEd, MSW, MBA)  Occupational History  . Occupation: retired  Scientific laboratory technician  . Financial resource strain: Not  hard at all  . Food insecurity    Worry: Never true    Inability: Never true  . Transportation needs    Medical: No    Non-medical: No  Tobacco Use  . Smoking status: Former Research scientist (life sciences)  . Smokeless tobacco: Never Used  . Tobacco comment: (08/29/15) quit 40 years ago  Substance and Sexual Activity  . Alcohol use: Yes    Alcohol/week: 3.0 - 4.0 standard drinks    Types: 3 - 4 Glasses of wine per week  . Drug use: No  . Sexual activity: Not on file  Lifestyle  . Physical activity    Days per week: 0 days    Minutes per session: 0 min  . Stress: Only a little  Relationships  . Social Herbalist on phone: Patient refused    Gets together: Patient refused    Attends religious service: Patient refused    Active member of club or organization: Patient refused    Attends meetings of clubs or organizations: Patient refused    Relationship status: Patient refused  Other Topics Concern  . Not on file  Social History Narrative  . Not on file    Outpatient Encounter Medications as of 07/25/2018  Medication Sig  . acetaminophen (TYLENOL) 500 MG tablet Take 500 mg by mouth every 6 (six) hours as needed.  Marland Kitchen azelastine (ASTELIN) 0.1 % nasal spray PLACE 1 SPRAY INTO THE NOSE 2 (TWO) TIMES DAILY. (Patient taking differently: Place 2 sprays into both nostrils as needed. )  . Calcium Carbonate-Vit D-Min (CALCIUM 1200 PO) Take by mouth daily.   . chlorpheniramine (ALLERGY RELIEF) 4 MG tablet Take 4 mg by mouth as needed for allergies.  . cholecalciferol (VITAMIN D3) 25 MCG (1000 UT) tablet Take 1,000 Units by mouth daily.  . fluticasone (FLONASE) 50 MCG/ACT nasal spray Place 2 sprays into both nostrils daily.   Vladimir Faster Glycol-Propyl Glycol (SYSTANE) 0.4-0.3 % SOLN Apply to eye as needed.   . polyethylene glycol powder (GLYCOLAX/MIRALAX) powder Take 1 Container by mouth as needed.   . timolol (TIMOPTIC) 0.5 % ophthalmic solution Place 1 drop into both eyes at bedtime.  . pravastatin  (PRAVACHOL) 20 MG tablet TAKE 1 TABLET BY MOUTH EVERY DAY (Patient not taking: Reported on 07/24/2018)  . Propylene Glycol (SYSTANE BALANCE) 0.6 % SOLN Apply to eye as needed.   No facility-administered encounter medications on file as of 07/25/2018.     Activities of Daily Living In your present state of health, do you have any difficulty performing the following activities: 07/25/2018 01/18/2018  Hearing? N N  Vision? N N  Comment Wears eye glasses daily. -  Difficulty concentrating or making decisions? Y Y  Comment Some trouble since stroke. -  Walking or climbing stairs? N N  Dressing or bathing? N N  Doing errands, shopping? N N  Preparing Food and eating ? N -  Using the Toilet? N -  In the past six months, have you accidently leaked urine? Y -  Comment Occasionally due to not being able to hold bladder. -  Do you have problems with loss of bowel control? Y -  Comment Rarely -  Managing your Medications? N -  Managing your Finances? N -  Housekeeping or managing your Housekeeping? N -  Some recent data might be hidden    Patient Care Team: Mar Daring, PA-C as PCP - General (Family Medicine) Dingeldein, Remo Lipps, MD as Consulting Physician (Ophthalmology) Dasher, Rayvon Char, MD as Consulting Physician (Dermatology) Venancio Poisson, NP as Nurse Practitioner (Neurology)    Assessment:   This is a routine wellness examination for Courtney Willis.  Exercise Activities and Dietary recommendations Current Exercise Habits: Home exercise routine, Type of exercise: walking, Time (Minutes): 30, Frequency (Times/Week): 3, Weekly Exercise (Minutes/Week): 90, Intensity: Mild, Exercise limited by: None identified  Goals    . Increase water intake     Recommend to continue current healthy diet and daily water intake of 6-8 glasses.         Fall Risk: Fall Risk  07/25/2018 02/27/2018 07/13/2017 07/01/2016 07/08/2015  Falls in the past year? 0 0 No Yes Yes  Number falls in past yr: - - - 1  -  Injury with Fall? - - - Yes No  Comment - - - broken wrist -  Follow up - - - Falls prevention discussed -    FALL RISK PREVENTION PERTAINING TO THE HOME:  Any stairs in or around the home? Yes  If so, are there any without handrails? No   Home free of loose throw rugs in walkways, pet beds, electrical cords, etc? Yes  Adequate lighting in your home to reduce risk of falls? Yes   ASSISTIVE DEVICES UTILIZED TO PREVENT FALLS:  Life alert? No  Use of a cane, walker or w/c? No  Grab bars in the bathroom? Yes  Shower chair or bench in shower? No  Elevated toilet seat or a handicapped toilet? Yes    TIMED UP AND GO:  Was the test performed? No .    Depression Screen PHQ 2/9 Scores 07/25/2018 02/27/2018 07/13/2017 07/13/2017  PHQ - 2 Score 0 1 0 0  PHQ- 9 Score - 4 1 -     Cognitive Function:     6CIT Screen 07/25/2018 07/01/2016  What Year? 0 points 0 points  What month? 0 points 0 points  What time? 0 points 0 points  Count back from 20 0 points 0 points  Months in reverse 0 points 0 points  Repeat phrase 0 points 0 points  Total Score 0 0    Immunization History  Administered Date(s) Administered  . Influenza, High Dose Seasonal PF 11/24/2016  . Influenza-Unspecified 10/13/2017  . Pneumococcal Conjugate-13 07/02/2014  . Pneumococcal Polysaccharide-23 07/01/2004, 11/05/2004  . Td 01/14/2003, 04/08/2010  . Tdap 04/08/2010, 08/26/2015  . Zoster 03/11/2005  . Zoster Recombinat (Shingrix) 10/13/2017, 02/21/2018    Qualifies for Shingles Vaccine? Completed series  Tdap: Up to date  Flu Vaccine: Up to date  Pneumococcal Vaccine: Completed series  Screening Tests Health Maintenance  Topic Date Due  . INFLUENZA VACCINE  09/02/2018  . DEXA SCAN  11/23/2018  . COLONOSCOPY  02/16/2020  . TETANUS/TDAP  08/25/2025  . PNA vac Low Risk Adult  Completed    Cancer Screenings:  Colorectal Screening: No longer required.   Mammogram: No longer required.   Bone  Density:  Completed 11/22/16. Results reflect OSTEOPOROSIS. Repeat every 2 years.   Lung Cancer Screening: (Low Dose CT Chest recommended if Age 74-80 years, 30 pack-year currently smoking OR have quit w/in 15years.) does not qualify.   Additional Screening:  Vision Screening: Recommended annual ophthalmology exams for early detection of glaucoma and other disorders of the eye.  Dental Screening: Recommended annual dental exams for proper oral hygiene  Community Resource Referral:  CRR required this visit?  No       Plan:  I have personally reviewed and addressed the Medicare Annual Wellness questionnaire and have noted the following in the patient's chart:  A. Medical and social history B. Use of alcohol, tobacco or illicit drugs  C. Current medications and supplements D. Functional ability and status E.  Nutritional status F.  Physical activity G. Advance directives H. List of other physicians I.  Hospitalizations, surgeries, and ER visits in previous 12 months J.  [place] such as hearing and vision if needed, cognitive and depression L. Referrals and appointments   In addition, I have reviewed and discussed with patient certain preventive protocols, quality metrics, and best practice recommendations. A written personalized care plan for preventive services as well as general preventive health recommendations were provided to patient. Nurse Health Advisor  Signed,    Abishai Viegas [place], [place]  [id number] Nurse Health Advisor   Nurse Notes: None.

## 2018-07-26 ENCOUNTER — Encounter: Payer: Self-pay | Admitting: Physician Assistant

## 2018-07-26 ENCOUNTER — Ambulatory Visit: Payer: Medicare Other | Admitting: Physician Assistant

## 2018-07-26 ENCOUNTER — Other Ambulatory Visit: Payer: Self-pay

## 2018-07-26 ENCOUNTER — Encounter (INDEPENDENT_AMBULATORY_CARE_PROVIDER_SITE_OTHER): Payer: Self-pay | Admitting: Vascular Surgery

## 2018-07-26 VITALS — BP 163/84 | HR 66 | Temp 97.5°F | Resp 16 | Ht 68.0 in | Wt 126.0 lb

## 2018-07-26 DIAGNOSIS — I10 Essential (primary) hypertension: Secondary | ICD-10-CM | POA: Diagnosis not present

## 2018-07-26 DIAGNOSIS — I872 Venous insufficiency (chronic) (peripheral): Secondary | ICD-10-CM | POA: Insufficient documentation

## 2018-07-26 DIAGNOSIS — I8393 Asymptomatic varicose veins of bilateral lower extremities: Secondary | ICD-10-CM | POA: Insufficient documentation

## 2018-07-26 MED ORDER — TRIAMTERENE-HCTZ 37.5-25 MG PO TABS: 1.0000 | ORAL_TABLET | Freq: Every day | ORAL | 1 refills | Status: DC

## 2018-07-26 NOTE — Patient Instructions (Signed)
Hydrochlorothiazide, HCTZ; Triamterene tablets or capsules What is this medicine? HYDROCHLOROTHIAZIDE; TRIAMTERENE (hye droe klor oh THYE a zide; trye AM ter een) is a diuretic. It helps you make more urine and lose the extra water from your body. This medicine is used to treat high blood pressure and edema or swelling from excess water. This medicine may be used for other purposes; ask your health care provider or pharmacist if you have questions. COMMON BRAND NAME(S): Dyazide, Maxzide What should I tell my health care provider before I take this medicine? They need to know if you have any of these conditions: -diabetes -immune system problems, like lupus -kidney disease or stones -liver disease -small amount of urine or difficulty passing urine -an unusual or allergic reaction to triamterene, hydrochlorothiazide, sulfa drugs, other medicines, foods, dyes, or preservatives -pregnant or trying to get pregnant -breast-feeding How should I use this medicine? Take this medicine by mouth with a glass of water. Follow the directions on your prescription label. Take your medicine at regular intervals. Do not take it more often than directed. Do not stop taking except on your doctor's advice. Remember that you will need to pass urine frequently after taking this medicine. Do not take your doses at a time of day that will cause you problems. Do not take at bedtime. Talk to your pediatrician regarding the use of this medicine in children. Special care may be needed. Overdosage: If you think you have taken too much of this medicine contact a poison control center or emergency room at once. NOTE: This medicine is only for you. Do not share this medicine with others. What if I miss a dose? If you miss a dose, take it as soon as you can. If it is almost time for your next dose, take only that dose. Do not take double or extra doses. What may interact with this medicine? Do not take this medicine with any  of the following medications: -cidofovir -dofetilide -eplerenone -potassium supplements -tranylcypromine This medicine may also interact with the following medications: -certain medicines for blood pressure, heart disease like benazepril, lisinopril, losartan, valsartan -lithium -medicines for diabetes -medicines that relax muscles for surgery -NSAIDs, medicines for pain and inflammation, like ibuprofen or naproxen -other diuretics -penicillin G potassium This list may not describe all possible interactions. Give your health care provider a list of all the medicines, herbs, non-prescription drugs, or dietary supplements you use. Also tell them if you smoke, drink alcohol, or use illegal drugs. Some items may interact with your medicine. What should I watch for while using this medicine? Visit your doctor or health care professional for regular check ups. You will need lab work done before you start this medicine and regularly while you are taking it. Check your blood pressure regularly. Ask your health care professional what your blood pressure should be, and when you should contact them. If you are a diabetic, check your blood sugar as directed. Do not stop taking your medicine unless your doctor tells you to. You may need to be on a special diet while taking this medicine. Ask your doctor. Also, ask how many glasses of fluid you need to drink a day. You must not get dehydrated. You may get drowsy or dizzy. Do not drive, use machinery, or do anything that needs mental alertness until you know how this medicine affects you. Do not stand or sit up quickly, especially if you are an older patient. This reduces the risk of dizzy or fainting spells. Alcohol  may interfere with the effect of this medicine. Avoid or limit alcoholic drinks. This medicine can make you more sensitive to the sun. Keep out of the sun. If you cannot avoid being in the sun, wear protective clothing and use sunscreen. Do not use  sun lamps or tanning beds/booths. What side effects may I notice from receiving this medicine? Side effects that you should report to your doctor or health care professional as soon as possible: -allergic reactions such as skin rash or itching, hives, swelling of the lips, mouth, tongue, or throat -changes in vision -eye pain -fast or irregular heartbeat, chest pain -feeling faint or dizzy -gout attack -muscle pain or cramps -numbness or tingling in hands, feet, or lips -pain or difficulty when passing urine -redness, blistering, peeling or loosening of the skin, including inside the mouth -shortness of breath -unusually weak or tired Side effects that usually do not require medical attention (report to your doctor or health care professional if they continue or are bothersome): -change in sex drive or performance -dry mouth -headache -stomach upset This list may not describe all possible side effects. Call your doctor for medical advice about side effects. You may report side effects to FDA at 1-800-FDA-1088. Where should I keep my medicine? Keep out of the reach of children. Store at room temperature between 15 and 30 degrees C (59 and 86 degrees F). Protect from light. Throw away any unused medicine after the expiration date. NOTE: This sheet is a summary. It may not cover all possible information. If you have questions about this medicine, talk to your doctor, pharmacist, or health care provider.  2019 Elsevier/Gold Standard (2016-06-03 10:08:07)

## 2018-07-26 NOTE — Progress Notes (Signed)
MRN : [id number]  Courtney Willis is a 84 y.o. ([birth year]) female who presents with chief complaint of  Chief Complaint  Patient presents with  . New Patient (Initial Visit)    ref [place] venous insufficiency  .  History of Present Illness:   The patient is seen for evaluation of symptomatic varicose veins. The patient relates burning and stinging which worsened steadily throughout the course of the day, particularly with standing. The patient also notes an aching and throbbing pain over the varicosities, particularly with prolonged dependent positions. The symptoms are significantly improved with elevation.  The patient also notes that during hot weather the symptoms are greatly intensified. The patient states the pain from the varicose veins interferes with work, daily exercise, shopping and household maintenance. At this point, the symptoms are persistent and severe enough that they're having a negative impact on lifestyle and are interfering with daily activities.  There is no history of DVT, PE or superficial thrombophlebitis. There is no history of ulceration or hemorrhage. The patient denies a significant family history of varicose veins.  The patient has not worn graduated compression in the past. At the present time the patient has not been using over-the-counter analgesics. There is no history of prior surgical intervention or sclerotherapy.    Current Meds  Medication Sig  . acetaminophen (TYLENOL) 500 MG tablet Take 500 mg by mouth every 6 (six) hours as needed.  Marland Kitchen azelastine (ASTELIN) 0.1 % nasal spray PLACE 1 SPRAY INTO THE NOSE 2 (TWO) TIMES DAILY. (Patient taking differently: Place 2 sprays into both nostrils as needed. )  . Calcium Carbonate-Vit D-Min (CALCIUM 1200 PO) Take by mouth daily.   . chlorpheniramine (ALLERGY RELIEF) 4 MG tablet Take 4 mg by mouth as needed for allergies.  . cholecalciferol (VITAMIN D3) 25 MCG (1000 UT) tablet Take 1,000 Units by mouth daily.   . fluticasone (FLONASE) 50 MCG/ACT nasal spray Place 2 sprays into both nostrils daily.   . polyethylene glycol powder (GLYCOLAX/MIRALAX) powder Take 1 Container by mouth as needed.   Marland Kitchen Propylene Glycol (SYSTANE BALANCE) 0.6 % SOLN Apply to eye as needed.  . timolol (TIMOPTIC) 0.5 % ophthalmic solution Place 1 drop into both eyes at bedtime.    Past Medical History:  Diagnosis Date  . Acid reflux 02/26/2008  . Arthritis   . 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 Avitaminosis D 08/12/2014  . Bowel disease 02/16/2008  . Bunion 08/12/2014  . Cancer [facility])    Skin cancer- basal- upper arm , upper chest  . CN (constipation) 08/12/2014  . DD (diverticular disease) 02/16/2008  . Elevated liver enzymes 08/12/2014  . Fibroids, submucosal 08/12/2014   of her lower lip   . Hypercholesteremia 08/12/2014  . 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 Post menopausal syndrome 08/12/2014  . Stroke [facility])     Past Surgical History:  Procedure Laterality Date  . BUNIONECTOMY     05/2004, 2007  . CATARACT EXTRACTION     04/2005, 06/2005  . COLONOSCOPY W/ POLYPECTOMY    . COLONOSCOPY WITH PROPOFOL N/A 02/15/2017   Procedure: COLONOSCOPY WITH PROPOFOL;  Surgeon: Jonathon Bellows, MD;  Location: [facility] ENDOSCOPY;  Service: Gastroenterology;  Laterality: N/A;  . HYSTEROSCOPY  2011  . ORIF WRIST FRACTURE Right 08/30/2015   Procedure: OPEN REDUCTION INTERNAL FIXATION (ORIF) RIGHT WRIST FRACTURE AND REPAIR AS NECESSARY;  Surgeon: Roseanne Kaufman, MD;  Location: [place];  Service: Orthopedics;  Laterality: Right;  . UPPER GI ENDOSCOPY    .  WRIST FRACTURE SURGERY Left    06/2006    Social History Social History   Tobacco Use  . Smoking status: Former Research scientist (life sciences)  . Smokeless tobacco: Never Used  . Tobacco comment: (08/29/15) quit 40 years ago  Substance Use Topics  . Alcohol use: Yes    Alcohol/week: 3.0 - 4.0 standard drinks    Types: 3 - 4 Glasses of wine per week  . Drug use: No    Family History Family History  Problem Relation Age of  Onset  . Rheum arthritis Mother   . Lung cancer Father   . Ulcers Father   . Hodgkin's lymphoma Sister   . AVM Daughter   . Breast cancer Paternal Grandmother   No family history of bleeding/clotting disorders, porphyria or autoimmune disease   No Known Allergies   REVIEW OF SYSTEMS (Negative unless checked)  Constitutional: [] Weight loss  [] Fever  [] Chills Cardiac: [] Chest pain   [] Chest pressure   [] Palpitations   [] Shortness of breath when laying flat   [] Shortness of breath with exertion. Vascular:  [] Pain in legs with walking   [] Pain in legs at rest  [] History of DVT   [] Phlebitis   [] Swelling in legs   [x] Varicose veins   [] Non-healing ulcers Pulmonary:   [] Uses home oxygen   [] Productive cough   [] Hemoptysis   [] Wheeze  [] COPD   [] Asthma Neurologic:  [] Dizziness   [] Seizures   [] History of stroke   [] History of TIA  [] Aphasia   [] Vissual changes   [] Weakness or numbness in arm   [] Weakness or numbness in leg Musculoskeletal:   [] Joint swelling   [] Joint pain   [] Low back pain Hematologic:  [] Easy bruising  [] Easy bleeding   [] Hypercoagulable state   [] Anemic Gastrointestinal:  [] Diarrhea   [] Vomiting  [x] Gastroesophageal reflux/heartburn   [] Difficulty swallowing. Genitourinary:  [] Chronic kidney disease   [] Difficult urination  [] Frequent urination   [] Blood in urine Skin:  [] Rashes   [] Ulcers  Psychological:  [] History of anxiety   []  History of major depression.  Physical Examination  Vitals:   07/24/18 1117  BP: (!) 184/83  Pulse: 76  Resp: 16  Weight: 125 lb 12.8 oz (57.1 kg)  Height: 5\' 8"  (1.727 m)   Body mass index is 19.13 kg/m. Gen: WD/WN, NAD Head: [place]/AT, No temporalis wasting.  Ear/Nose/Throat: Hearing grossly intact, nares w/o erythema or drainage, poor dentition Eyes: PER, EOMI, sclera nonicteric.  Neck: Supple, no masses.  No bruit or JVD.  Pulmonary:  Good air movement, clear to auscultation bilaterally, no use of accessory muscles.  Cardiac: RRR,  normal S1, S2, no Murmurs. Vascular: scattered varicosities present bilaterally.  Mild venous stasis changes to the legs bilaterally.  2+ soft pitting edema Vessel Right Left  Radial Palpable Palpable  PT Palpable Palpable  DP Palpable Palpable  Gastrointestinal: soft, non-distended. No guarding/no peritoneal signs.  Musculoskeletal: M/S 5/5 throughout.  No deformity or atrophy.  Neurologic: CN 2-12 intact. Pain and light touch intact in extremities.  Symmetrical.  Speech is fluent. Motor exam as listed above. Psychiatric: Judgment intact, Mood & affect appropriate for pt's clinical situation. Dermatologic: Mild venous rashes no ulcers noted.  No changes consistent with cellulitis. Lymph : No Cervical lymphadenopathy, no lichenification or skin changes of chronic lymphedema.  CBC Lab Results  Component Value Date   WBC 5.2 01/19/2018   HGB 11.9 (L) 01/19/2018   HCT 36.1 01/19/2018   MCV 94.5 01/19/2018   PLT 255 01/19/2018    BMET  Component Value Date/Time   NA 137 01/24/2018 0922   NA 141 07/20/2017 1059   K 4.0 01/24/2018 0922   CL 101 01/24/2018 0922   CO2 26 01/24/2018 0922   GLUCOSE 104 (H) 01/24/2018 0922   BUN 13 01/24/2018 0922   BUN 14 07/20/2017 1059   CREATININE 0.78 01/24/2018 0922   CALCIUM 9.4 01/24/2018 0922   GFRNONAA >60 01/24/2018 0922   GFRAA >60 01/24/2018 0922   CrCl cannot be calculated (Patient's most recent lab result is older than the maximum 21 days allowed.).  COAG Lab Results  Component Value Date   INR 0.93 01/13/2018    Radiology No results found.   Assessment/Plan 1. Chronic venous insufficiency No surgery or intervention at this point in time.    I have had a long discussion with the patient regarding venous insufficiency and why it  causes symptoms. I have discussed with the patient the chronic skin changes that accompany venous insufficiency and the long term sequela such as infection and ulceration.  Patient will begin  wearing graduated compression stockings class 1 (20-30 mmHg) or compression wraps on a daily basis a prescription was given. The patient will put the stockings on first thing in the morning and removing them in the evening. The patient is instructed specifically not to sleep in the stockings.    In addition, behavioral modification including several periods of elevation of the lower extremities during the day will be continued. I have demonstrated that proper elevation is a position with the ankles at heart level.  The patient is instructed to begin routine exercise, especially walking on a daily basis   2. Varicose veins of both lower extremities with inflammation Recommend:  The patient is complaining of varicose veins.    I have had a long discussion with the patient regarding  varicose veins and why they cause symptoms.  Patient will begin wearing graduated compression stockings on a daily basis, beginning first thing in the morning and removing them in the evening. The patient is instructed specifically not to sleep in the stockings.    The patient  will also begin using over-the-counter analgesics such as Motrin 600 mg po TID to help control the symptoms as needed.    In addition, behavioral modification including elevation during the day will be initiated, utilizing a recliner was recommended.  The patient is also instructed to continue exercising such as walking 4-5 times per week.  At this time the patient wishes to continue conservative therapy and is not interested in more invasive treatments such as laser ablation and sclerotherapy.  The Patient will follow up PRN if the symptoms worsen.  3. Gastroesophageal reflux disease without esophagitis Continue PPI as already ordered, this medication has been reviewed and there are no changes at this time.  Avoidence of caffeine and alcohol  Moderate elevation of the head of the bed   4. Dyslipidemia Continue statin as ordered and  reviewed, no changes at this time     Hortencia Pilar, MD  07/26/2018 10:40 AM

## 2018-08-08 ENCOUNTER — Other Ambulatory Visit: Payer: Self-pay | Admitting: Physician Assistant

## 2018-08-21 ENCOUNTER — Other Ambulatory Visit: Payer: Self-pay

## 2018-08-21 DIAGNOSIS — I10 Essential (primary) hypertension: Secondary | ICD-10-CM

## 2018-08-21 MED ORDER — TRIAMTERENE-HCTZ 37.5-25 MG PO TABS: 1.0000 | ORAL_TABLET | Freq: Every day | ORAL | 1 refills | Status: DC

## 2018-08-23 ENCOUNTER — Ambulatory Visit: Payer: Self-pay | Admitting: Physician Assistant

## 2018-08-31 ENCOUNTER — Other Ambulatory Visit: Payer: Self-pay

## 2018-08-31 ENCOUNTER — Encounter: Payer: Self-pay | Admitting: Physician Assistant

## 2018-08-31 ENCOUNTER — Ambulatory Visit (INDEPENDENT_AMBULATORY_CARE_PROVIDER_SITE_OTHER): Payer: Medicare Other | Admitting: Physician Assistant

## 2018-08-31 VITALS — BP 125/69 | HR 94 | Temp 98.6°F | Resp 16 | Wt 124.4 lb

## 2018-08-31 DIAGNOSIS — I10 Essential (primary) hypertension: Secondary | ICD-10-CM | POA: Diagnosis not present

## 2018-08-31 DIAGNOSIS — I5032 Chronic diastolic (congestive) heart failure: Secondary | ICD-10-CM | POA: Diagnosis not present

## 2018-08-31 MED ORDER — TRIAMTERENE-HCTZ 37.5-25 MG PO TABS: 1.0000 | ORAL_TABLET | Freq: Every day | ORAL | 1 refills | Status: DC

## 2018-08-31 NOTE — Patient Instructions (Signed)

## 2018-08-31 NOTE — Progress Notes (Signed)
Patient: Courtney Willis Female    DOB: 12-09-1934   84 y.o.   MRN: [id number] Visit Date: 08/31/2018  Today's Provider: Mar Daring, PA-C   Chief Complaint  Patient presents with  . Follow-up   Subjective:     HPI  Patient here for her 4 week follow up Essential Hypertension. She was started on Maxide. Patien reports no side effect from medicine. No chest pain, headache or dizziness.she reports BP reading at home are 125/74-111/60's.  No Known Allergies   Current Outpatient Medications:  .  acetaminophen (TYLENOL) 500 MG tablet, Take 500 mg by mouth every 6 (six) hours as needed., Disp: , Rfl:  .  azelastine (ASTELIN) 0.1 % nasal spray, PLACE 1 SPRAY INTO THE NOSE 2 (TWO) TIMES DAILY. (Patient taking differently: Place 2 sprays into both nostrils as needed. ), Disp: 30 mL, Rfl: 1 .  Calcium Carbonate-Vit D-Min (CALCIUM 1200 PO), Take by mouth daily. , Disp: , Rfl:  .  chlorpheniramine (ALLERGY RELIEF) 4 MG tablet, Take 4 mg by mouth as needed for allergies., Disp: , Rfl:  .  cholecalciferol (VITAMIN D3) 25 MCG (1000 UT) tablet, Take 1,000 Units by mouth daily., Disp: , Rfl:  .  fluticasone (FLONASE) 50 MCG/ACT nasal spray, SPRAY 2 SPRAYS INTO EACH NOSTRIL EVERY DAY, Disp: 48 mL, Rfl: 2 .  Polyethyl Glycol-Propyl Glycol (SYSTANE) 0.4-0.3 % SOLN, Apply to eye as needed. , Disp: , Rfl:  .  polyethylene glycol powder (GLYCOLAX/MIRALAX) powder, Take 1 Container by mouth as needed. , Disp: , Rfl:  .  Propylene Glycol (SYSTANE BALANCE) 0.6 % SOLN, Apply to eye as needed., Disp: , Rfl:  .  timolol (TIMOPTIC) 0.5 % ophthalmic solution, Place 1 drop into both eyes at bedtime., Disp: , Rfl: 5 .  triamterene-hydrochlorothiazide (MAXZIDE-25) 37.5-25 MG tablet, Take 1 tablet by mouth daily., Disp: 30 tablet, Rfl: 1  Review of Systems  Constitutional: Negative.   Respiratory: Negative.   Cardiovascular: Negative.   Neurological: Negative for dizziness, light-headedness and  headaches.    Social History   Tobacco Use  . Smoking status: Former Research scientist (life sciences)  . Smokeless tobacco: Never Used  . Tobacco comment: (08/29/15) quit 40 years ago  Substance Use Topics  . Alcohol use: Yes    Alcohol/week: 3.0 - 4.0 standard drinks    Types: 3 - 4 Glasses of wine per week      Objective:   BP 125/69 (BP Location: Left Arm, Patient Position: Sitting, Cuff Size: Normal)   Pulse 94   Temp 98.6 F (37 C) (Oral)   Resp 16   Wt 124 lb 6.4 oz (56.4 kg)   BMI 18.91 kg/m  Vitals:   08/31/18 1443  BP: 125/69  Pulse: 94  Resp: 16  Temp: 98.6 F (37 C)  TempSrc: Oral  Weight: 124 lb 6.4 oz (56.4 kg)     Physical Exam Vitals signs reviewed.  Constitutional:      General: She is not in acute distress.    Appearance: Normal appearance. She is well-developed and normal weight. She is not ill-appearing or diaphoretic.  Neck:     Musculoskeletal: Normal range of motion and neck supple.  Cardiovascular:     Rate and Rhythm: Normal rate and regular rhythm.     Heart sounds: Normal heart sounds. No murmur. No friction rub. No gallop.   Pulmonary:     Effort: Pulmonary effort is normal. No respiratory distress.     Breath  sounds: Normal breath sounds. No wheezing or rales.  Neurological:     Mental Status: She is alert.      No results found for any visits on 08/31/18.     Assessment & Plan    1. Essential hypertension Improved on maxzide. Continue as below. Will check labs as below and f/u pending results. Patient has CPE on 09/20/18. - CBC w/Diff/Platelet - Basic Metabolic Panel (BMET) - triamterene-hydrochlorothiazide (MAXZIDE-25) 37.5-25 MG tablet; Take 1 tablet by mouth daily.  Dispense: 90 tablet; Refill: 1  2. Chronic diastolic heart failure (HCC) Stable.      Mar Daring, PA-C  [facility]

## 2018-09-01 ENCOUNTER — Telehealth: Payer: Self-pay

## 2018-09-01 LAB — CBC WITH DIFFERENTIAL/PLATELET
Basophils Absolute: 0 10*3/uL (ref 0.0–0.2)
Basos: 0 %
EOS (ABSOLUTE): 0 10*3/uL (ref 0.0–0.4)
Eos: 1 %
Hematocrit: 37.5 % (ref 34.0–46.6)
Hemoglobin: 12.8 g/dL (ref 11.1–15.9)
Immature Grans (Abs): 0 10*3/uL (ref 0.0–0.1)
Immature Granulocytes: 0 %
Lymphocytes Absolute: 1.5 10*3/uL (ref 0.7–3.1)
Lymphs: 26 %
MCH: 30.8 pg (ref 26.6–33.0)
MCHC: 34.1 g/dL (ref 31.5–35.7)
MCV: 90 fL (ref 79–97)
Monocytes Absolute: 0.6 10*3/uL (ref 0.1–0.9)
Monocytes: 10 %
Neutrophils Absolute: 3.7 10*3/uL (ref 1.4–7.0)
Neutrophils: 63 %
Platelets: 295 10*3/uL (ref 150–450)
RBC: 4.15 x10E6/uL (ref 3.77–5.28)
RDW: 12.1 % (ref 11.7–15.4)
WBC: 5.7 10*3/uL (ref 3.4–10.8)

## 2018-09-01 LAB — BASIC METABOLIC PANEL
BUN/Creatinine Ratio: 23 (ref 12–28)
BUN: 23 mg/dL (ref 8–27)
CO2: 24 mmol/L (ref 20–29)
Calcium: 9.8 mg/dL (ref 8.7–10.3)
Chloride: 98 mmol/L (ref 96–106)
Creatinine, Ser: 0.98 mg/dL (ref 0.57–1.00)
GFR calc Af Amer: 62 mL/min/{1.73_m2} (ref >59)
GFR calc non Af Amer: 54 mL/min/{1.73_m2} — ABNORMAL LOW (ref >59)
Glucose: 104 mg/dL — ABNORMAL HIGH (ref 65–99)
Potassium: 4.5 mmol/L (ref 3.5–5.2)
Sodium: 139 mmol/L (ref 134–144)

## 2018-09-01 NOTE — Telephone Encounter (Signed)
-----   Message from Mar Daring, PA-C sent at 09/01/2018  8:26 AM EDT ----- All labs are within normal limits and stable.  Thanks! -JB

## 2018-09-01 NOTE — Telephone Encounter (Signed)
Patient advised as directed below. 

## 2018-09-20 ENCOUNTER — Ambulatory Visit (INDEPENDENT_AMBULATORY_CARE_PROVIDER_SITE_OTHER): Payer: Medicare Other | Admitting: Physician Assistant

## 2018-09-20 ENCOUNTER — Other Ambulatory Visit: Payer: Self-pay

## 2018-09-20 ENCOUNTER — Encounter: Payer: Self-pay | Admitting: Physician Assistant

## 2018-09-20 VITALS — BP 138/70 | HR 73 | Temp 97.3°F | Resp 16 | Ht 68.0 in | Wt 126.2 lb

## 2018-09-20 DIAGNOSIS — Z1239 Encounter for other screening for malignant neoplasm of breast: Secondary | ICD-10-CM

## 2018-09-20 DIAGNOSIS — E039 Hypothyroidism, unspecified: Secondary | ICD-10-CM | POA: Diagnosis not present

## 2018-09-20 DIAGNOSIS — E038 Other specified hypothyroidism: Secondary | ICD-10-CM

## 2018-09-20 DIAGNOSIS — Z23 Encounter for immunization: Secondary | ICD-10-CM

## 2018-09-20 DIAGNOSIS — R3911 Hesitancy of micturition: Secondary | ICD-10-CM | POA: Diagnosis not present

## 2018-09-20 DIAGNOSIS — I10 Essential (primary) hypertension: Secondary | ICD-10-CM

## 2018-09-20 DIAGNOSIS — Z0000 Encounter for general adult medical examination without abnormal findings: Secondary | ICD-10-CM

## 2018-09-20 DIAGNOSIS — E7800 Pure hypercholesterolemia, unspecified: Secondary | ICD-10-CM

## 2018-09-20 LAB — POCT URINALYSIS DIPSTICK
Bilirubin, UA: NEGATIVE
Blood, UA: NEGATIVE
Glucose, UA: NEGATIVE
Ketones, UA: NEGATIVE
Leukocytes, UA: NEGATIVE
Nitrite, UA: NEGATIVE
Odor: NEGATIVE
Protein, UA: NEGATIVE
Spec Grav, UA: 1.01 (ref 1.010–1.025)
Urobilinogen, UA: 0.2 E.U./dL
pH, UA: 6 (ref 5.0–8.0)

## 2018-09-20 MED ORDER — LOSARTAN POTASSIUM 25 MG PO TABS: 25.0000 mg | ORAL_TABLET | Freq: Every day | ORAL | 1 refills | Status: DC

## 2018-09-20 NOTE — Patient Instructions (Signed)
Health Maintenance After Age 82 After age 82, you are at a higher risk for certain long-term diseases and infections as well as injuries from falls. Falls are a major cause of broken bones and head injuries in people who are older than age 82. Getting regular preventive care can help to keep you healthy and well. Preventive care includes getting regular testing and making lifestyle changes as recommended by your health care provider. Talk with your health care provider about:  Which screenings and tests you should have. A screening is a test that checks for a disease when you have no symptoms.  A diet and exercise plan that is right for you. What should I know about screenings and tests to prevent falls? Screening and testing are the best ways to find a health problem early. Early diagnosis and treatment give you the best chance of managing medical conditions that are common after age 82. Certain conditions and lifestyle choices may make you more likely to have a fall. Your health care provider may recommend:  Regular vision checks. Poor vision and conditions such as cataracts can make you more likely to have a fall. If you wear glasses, make sure to get your prescription updated if your vision changes.  Medicine review. Work with your health care provider to regularly review all of the medicines you are taking, including over-the-counter medicines. Ask your health care provider about any side effects that may make you more likely to have a fall. Tell your health care provider if any medicines that you take make you feel dizzy or sleepy.  Osteoporosis screening. Osteoporosis is a condition that causes the bones to get weaker. This can make the bones weak and cause them to break more easily.  Blood pressure screening. Blood pressure changes and medicines to control blood pressure can make you feel dizzy.  Strength and balance checks. Your health care provider may recommend certain tests to check your  strength and balance while standing, walking, or changing positions.  Foot health exam. Foot pain and numbness, as well as not wearing proper footwear, can make you more likely to have a fall.  Depression screening. You may be more likely to have a fall if you have a fear of falling, feel emotionally low, or feel unable to do activities that you used to do.  Alcohol use screening. Using too much alcohol can affect your balance and may make you more likely to have a fall. What actions can I take to lower my risk of falls? General instructions  Talk with your health care provider about your risks for falling. Tell your health care provider if: ? You fall. Be sure to tell your health care provider about all falls, even ones that seem minor. ? You feel dizzy, sleepy, or off-balance.  Take over-the-counter and prescription medicines only as told by your health care provider. These include any supplements.  Eat a healthy diet and maintain a healthy weight. A healthy diet includes low-fat dairy products, low-fat (lean) meats, and fiber from whole grains, beans, and lots of fruits and vegetables. Home safety  Remove any tripping hazards, such as rugs, cords, and clutter.  Install safety equipment such as grab bars in bathrooms and safety rails on stairs.  Keep rooms and walkways well-lit. Activity   Follow a regular exercise program to stay fit. This will help you maintain your balance. Ask your health care provider what types of exercise are appropriate for you.  If you need a cane or   walker, use it as recommended by your health care provider.  Wear supportive shoes that have nonskid soles. Lifestyle  Do not drink alcohol if your health care provider tells you not to drink.  If you drink alcohol, limit how much you have: ? 0-1 drink a day for women. ? 0-2 drinks a day for men.  Be aware of how much alcohol is in your drink. In the [place] one drink equals one typical bottle of beer (12  oz), one-half glass of wine (5 oz), or one shot of hard liquor (1 oz).  Do not use any products that contain nicotine or tobacco, such as cigarettes and e-cigarettes. If you need help quitting, ask your health care provider. Summary  Having a healthy lifestyle and getting preventive care can help to protect your health and wellness after age 82.  Screening and testing are the best way to find a health problem early and help you avoid having a fall. Early diagnosis and treatment give you the best chance for managing medical conditions that are more common for people who are older than age 82.  Falls are a major cause of broken bones and head injuries in people who are older than age 82. Take precautions to prevent a fall at home.  Work with your health care provider to learn what changes you can make to improve your health and wellness and to prevent falls. This information is not intended to replace advice given to you by your health care provider. Make sure you discuss any questions you have with your health care provider. Document Released: 12/01/2016 Document Revised: 05/11/2018 Document Reviewed: 12/01/2016 Elsevier Patient Education  2020 Elsevier Inc.  

## 2018-09-20 NOTE — Progress Notes (Signed)
Patient: Courtney Willis, Female    DOB: 1934/04/10, 84 y.o.   MRN: [id number] Visit Date: 09/20/2018  Today's Provider: Mar Daring, PA-C   Chief Complaint  Patient presents with  . Annual Exam   Subjective:   Patient had AWV with [facility] 07/25/2018   Complete Physical Courtney Willis is a 84 y.o. female. She feels well. She reports exercising. She reports she is sleeping well. ----------------------------------------------------------- Patient reports that she may be allergic to the Triamterene-Hydrochlorothiazide. She reports it makes her feel dizzy and off balance. She has been taking it except for today.  Review of Systems  Constitutional: Negative.   HENT: Negative.   Eyes: Negative.   Respiratory: Negative.   Cardiovascular: Positive for leg swelling ("sometimes").  Gastrointestinal: Negative.   Endocrine: Negative.   Genitourinary: Negative.   Musculoskeletal: Negative.   Skin: Negative.   Allergic/Immunologic: Positive for environmental allergies.  Neurological: Negative.   Hematological: Bruises/bleeds easily ("thin skin").  Psychiatric/Behavioral: Negative.     Social History   Socioeconomic History  . Marital status: Divorced    Spouse name: Not on file  . Number of children: 2  . Years of education: Not on file  . Highest education level: Master's degree (e.g., MA, MS, MEng, MEd, MSW, MBA)  Occupational History  . Occupation: retired  Scientific laboratory technician  . Financial resource strain: Not hard at all  . Food insecurity    Worry: Never true    Inability: Never true  . Transportation needs    Medical: No    Non-medical: No  Tobacco Use  . Smoking status: Former Research scientist (life sciences)  . Smokeless tobacco: Never Used  . Tobacco comment: (08/29/15) quit 40 years ago  Substance and Sexual Activity  . Alcohol use: Yes    Alcohol/week: 3.0 - 4.0 standard drinks    Types: 3 - 4 Glasses of wine per week  . Drug use: No  . Sexual activity: Not on file  Lifestyle  .  Physical activity    Days per week: 0 days    Minutes per session: 0 min  . Stress: Only a little  Relationships  . Social Herbalist on phone: Patient refused    Gets together: Patient refused    Attends religious service: Patient refused    Active member of club or organization: Patient refused    Attends meetings of clubs or organizations: Patient refused    Relationship status: Patient refused  . Intimate partner violence    Fear of current or ex partner: Patient refused    Emotionally abused: Patient refused    Physically abused: Patient refused    Forced sexual activity: Patient refused  Other Topics Concern  . Not on file  Social History Narrative  . Not on file    Past Medical History:  Diagnosis Date  . Acid reflux 02/26/2008  . Arthritis   . 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 Avitaminosis D 08/12/2014  . Bowel disease 02/16/2008  . Bunion 08/12/2014  . Cancer [facility])    Skin cancer- basal- upper arm , upper chest  . CN (constipation) 08/12/2014  . DD (diverticular disease) 02/16/2008  . Elevated liver enzymes 08/12/2014  . Fibroids, submucosal 08/12/2014   of her lower lip   . Hypercholesteremia 08/12/2014  . 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 Post menopausal syndrome 08/12/2014  . Stroke [facility])      Patient Active Problem List   Diagnosis Date Noted  . Chronic  venous insufficiency 07/26/2018  . Varicose veins of both lower extremities 07/26/2018  . Cerebral amyloid angiopathy ([place]) 02/13/2018  . Labile blood pressure   . SAH (subarachnoid hemorrhage) ([place]) 01/18/2018  . Intraparenchymal hemorrhage of brain ([place])   . Dyslipidemia   . Hyponatremia   . Tobacco abuse   . Hyperlipidemia   . Acute congestive heart failure with left ventricular diastolic dysfunction ([place])   . Acute blood loss anemia   . ICH (intracerebral hemorrhage) ([place]) 01/13/2018  . Subclinical hypothyroidism 07/20/2017  . Bunion 08/12/2014  . Cataract 08/12/2014  . CN (constipation) 08/12/2014   . Edema of foot 08/12/2014  . Fibroids, submucosal 08/12/2014  . Glaucoma 08/12/2014  . Hypercholesteremia 08/12/2014  . Coitalgia 08/12/2014  . 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 Avitaminosis D 08/12/2014  . Post menopausal syndrome 08/12/2014  . Alcohol use 08/06/2014  . Acid reflux 02/26/2008  . DD (diverticular disease) 02/16/2008  . Bowel disease 02/16/2008    Past Surgical History:  Procedure Laterality Date  . BUNIONECTOMY     05/2004, 2007  . CATARACT EXTRACTION     04/2005, 06/2005  . COLONOSCOPY W/ POLYPECTOMY    . COLONOSCOPY WITH PROPOFOL N/A 02/15/2017   Procedure: COLONOSCOPY WITH PROPOFOL;  Surgeon: Jonathon Bellows, MD;  Location: [facility] ENDOSCOPY;  Service: Gastroenterology;  Laterality: N/A;  . HYSTEROSCOPY  2011  . ORIF WRIST FRACTURE Right 08/30/2015   Procedure: OPEN REDUCTION INTERNAL FIXATION (ORIF) RIGHT WRIST FRACTURE AND REPAIR AS NECESSARY;  Surgeon: Roseanne Kaufman, MD;  Location: [place];  Service: Orthopedics;  Laterality: Right;  . UPPER GI ENDOSCOPY    . WRIST FRACTURE SURGERY Left    06/2006    Her family history includes AVM in her daughter; Breast cancer in her paternal grandmother; Hodgkin's lymphoma in her sister; Lung cancer in her father; Rheum arthritis in her mother; Ulcers in her father.   Current Outpatient Medications:  .  acetaminophen (TYLENOL) 500 MG tablet, Take 500 mg by mouth every 6 (six) hours as needed., Disp: , Rfl:  .  azelastine (ASTELIN) 0.1 % nasal spray, PLACE 1 SPRAY INTO THE NOSE 2 (TWO) TIMES DAILY. (Patient taking differently: Place 2 sprays into both nostrils as needed. ), Disp: 30 mL, Rfl: 1 .  Calcium Carbonate-Vit D-Min (CALCIUM 1200 PO), Take by mouth daily. , Disp: , Rfl:  .  chlorpheniramine (ALLERGY RELIEF) 4 MG tablet, Take 4 mg by mouth as needed for allergies., Disp: , Rfl:  .  cholecalciferol (VITAMIN D3) 25 MCG (1000 UT) tablet, Take 1,000 Units by mouth daily., Disp: , Rfl:  .   fluticasone (FLONASE) 50 MCG/ACT nasal spray, SPRAY 2 SPRAYS INTO EACH NOSTRIL EVERY DAY, Disp: 48 mL, Rfl: 2 .  Polyethyl Glycol-Propyl Glycol (SYSTANE) 0.4-0.3 % SOLN, Apply to eye as needed. , Disp: , Rfl:  .  polyethylene glycol powder (GLYCOLAX/MIRALAX) powder, Take 1 Container by mouth as needed. , Disp: , Rfl:  .  Propylene Glycol (SYSTANE BALANCE) 0.6 % SOLN, Apply to eye as needed., Disp: , Rfl:  .  timolol (TIMOPTIC) 0.5 % ophthalmic solution, Place 1 drop into both eyes at bedtime., Disp: , Rfl: 5 .  triamterene-hydrochlorothiazide (MAXZIDE-25) 37.5-25 MG tablet, Take 1 tablet by mouth daily., Disp: 90 tablet, Rfl: 1  Patient Care Team: Mar Daring, PA-C as PCP - General (Family Medicine) Dingeldein, Remo Lipps, MD as Consulting Physician (Ophthalmology) Dasher, Rayvon Char, MD as Consulting Physician (Dermatology) Claris Gower, NP as Nurse Practitioner (Neurology)  Objective:    Vitals: BP 138/70 (BP Location: Left Arm, Patient Position: Sitting, Cuff Size: Normal)   Pulse 73   Temp (!) 97.3 F (36.3 C) (Other (Comment))   Resp 16   Ht 5\' 8"  (1.727 m)   Wt 126 lb 3.2 oz (57.2 kg)   SpO2 98%   BMI 19.19 kg/m   Physical Exam Vitals signs reviewed.  Constitutional:      General: She is not in acute distress.    Appearance: Normal appearance. She is well-developed and normal weight. She is not diaphoretic.  HENT:     Head: Normocephalic and atraumatic.     Right Ear: Tympanic membrane, ear canal and external ear normal.     Left Ear: Tympanic membrane, ear canal and external ear normal.     Nose: Nose normal.     Mouth/Throat:     Mouth: Mucous membranes are moist.     Pharynx: Oropharynx is clear. No oropharyngeal exudate.  Eyes:     General: No scleral icterus.       Right eye: No discharge.        Left eye: No discharge.     Extraocular Movements: Extraocular movements intact.     Conjunctiva/sclera: Conjunctivae normal.     Pupils: Pupils are equal,  round, and reactive to light.  Neck:     Musculoskeletal: Normal range of motion and neck supple.     Thyroid: No thyromegaly.     Vascular: No carotid bruit or JVD.     Trachea: No tracheal deviation.  Cardiovascular:     Rate and Rhythm: Normal rate and regular rhythm.     Pulses: Normal pulses.     Heart sounds: Normal heart sounds. No murmur. No friction rub. No gallop.   Pulmonary:     Effort: Pulmonary effort is normal. No respiratory distress.     Breath sounds: Normal breath sounds. No wheezing or rales.  Chest:     Chest wall: No tenderness.  Abdominal:     General: Abdomen is flat. Bowel sounds are normal. There is no distension.     Palpations: Abdomen is soft. There is no mass.     Tenderness: There is no abdominal tenderness. There is no guarding or rebound.  Musculoskeletal: Normal range of motion.        General: No tenderness.     Right lower leg: Edema present.     Left lower leg: Edema present.  Lymphadenopathy:     Cervical: No cervical adenopathy.  Skin:    General: Skin is warm and dry.     Capillary Refill: Capillary refill takes less than 2 seconds.     Findings: No rash.  Neurological:     General: No focal deficit present.     Mental Status: She is alert and oriented to person, place, and time. Mental status is at baseline.  Psychiatric:        Mood and Affect: Mood normal.        Behavior: Behavior normal.        Thought Content: Thought content normal.        Judgment: Judgment normal.     Activities of Daily Living In your present state of health, do you have any difficulty performing the following activities: 07/25/2018 01/18/2018  Hearing? N N  Vision? N N  Comment Wears eye glasses daily. -  Difficulty concentrating or making decisions? Y Y  Comment Some trouble since stroke. -  Walking or climbing  stairs? N N  Dressing or bathing? N N  Doing errands, shopping? N N  Preparing Food and eating ? N -  Using the Toilet? N -  In the past  six months, have you accidently leaked urine? Y -  Comment Occasionally due to not being able to hold bladder. -  Do you have problems with loss of bowel control? Y -  Comment Rarely -  Managing your Medications? N -  Managing your Finances? N -  Housekeeping or managing your Housekeeping? N -  Some recent data might be hidden    Fall Risk Assessment Fall Risk  07/25/2018 02/27/2018 07/13/2017 07/01/2016 07/08/2015  Falls in the past year? 0 0 No Yes Yes  Number falls in past yr: - - - 1 -  Injury with Fall? - - - Yes No  Comment - - - broken wrist -  Follow up - - - Falls prevention discussed -     Depression Screen PHQ 2/9 Scores 07/25/2018 02/27/2018 07/13/2017 07/13/2017  PHQ - 2 Score 0 1 0 0  PHQ- 9 Score - 4 1 -    6CIT Screen 07/25/2018  What Year? 0 points  What month? 0 points  What time? 0 points  Count back from 20 0 points  Months in reverse 0 points  Repeat phrase 0 points  Total Score 0       Assessment & Plan:    Annual Physical Reviewed patient's Family Medical History Reviewed and updated list of patient's medical providers Assessment of cognitive impairment was done Assessed patient's functional ability Established a written schedule for health screening [place] Completed and Reviewed  Exercise Activities and Dietary recommendations Goals    . Increase water intake     Recommend to continue current healthy diet and daily water intake of 6-8 glasses.         Immunization History  Administered Date(s) Administered  . Influenza, High Dose Seasonal PF 11/24/2016  . Influenza-Unspecified 10/13/2017  . Pneumococcal Conjugate-13 07/02/2014  . Pneumococcal Polysaccharide-23 07/01/2004, 11/05/2004  . Td 01/14/2003, 04/08/2010  . Tdap 04/08/2010, 08/26/2015  . Zoster 03/11/2005  . Zoster Recombinat (Shingrix) 10/13/2017, 02/21/2018    Health Maintenance  Topic Date Due  . INFLUENZA VACCINE  09/02/2018  . DEXA SCAN  11/23/2018   . COLONOSCOPY  02/16/2020  . TETANUS/TDAP  08/25/2025  . PNA vac Low Risk Adult  Completed     Discussed health benefits of physical activity, and encouraged her to engage in regular exercise appropriate for her age and condition.    1. Annual physical exam Normal exam. Up to date on screenings and vaccinations.   2. Breast cancer screening Breast exam today was normal. There is no family history of breast cancer. She does perform regular self breast exams. Mammogram was ordered as below. Information for [facility] Breast clinic was given to patient so she may schedule her mammogram at her convenience. - MM 3D SCREEN BREAST BILATERAL; Future  3. Need for influenza vaccination Flu vaccine given today without complication. Patient sat upright for 15 minutes to check for adverse reaction before being released. - Flu Vaccine QUAD High Dose(Fluad)  4. Subclinical hypothyroidism Will check labs as below and f/u pending results. - T4 AND TSH  5. Pure hypercholesterolemia Stable. Diet controlled. Not on statins due to increased risk of recurrence of CVA from cerebral amyloid angiopathy. Will check labs as below and f/u pending results. - Lipid Profile - Comprehensive Metabolic Panel (CMET)  6. Essential hypertension Stop Maxzide. Start low dose losartan as below. I will see her back in 2-3 months to recheck.  - losartan (COZAAR) 25 MG tablet; Take 1 tablet (25 mg total) by mouth daily.  Dispense: 90 tablet; Refill: 1 - Comprehensive Metabolic Panel (CMET)  7. Urinary hesitancy UA normal.  - POCT Urinalysis Dipstick  ------------------------------------------------------------------------------------------------------------    Mar Daring, PA-C  [facility]

## 2018-09-21 ENCOUNTER — Telehealth: Payer: Self-pay

## 2018-09-21 LAB — COMPREHENSIVE METABOLIC PANEL
ALT: 9 IU/L (ref 0–32)
AST: 18 IU/L (ref 0–40)
Albumin/Globulin Ratio: 2.3 — ABNORMAL HIGH (ref 1.2–2.2)
Albumin: 4.3 g/dL (ref 3.6–4.6)
Alkaline Phosphatase: 53 IU/L (ref 39–117)
BUN/Creatinine Ratio: 22 (ref 12–28)
BUN: 21 mg/dL (ref 8–27)
Bilirubin Total: 0.4 mg/dL (ref 0.0–1.2)
CO2: 27 mmol/L (ref 20–29)
Calcium: 9.7 mg/dL (ref 8.7–10.3)
Chloride: 99 mmol/L (ref 96–106)
Creatinine, Ser: 0.95 mg/dL (ref 0.57–1.00)
GFR calc Af Amer: 65 mL/min/{1.73_m2} (ref >59)
GFR calc non Af Amer: 56 mL/min/{1.73_m2} — ABNORMAL LOW (ref >59)
Globulin, Total: 1.9 g/dL (ref 1.5–4.5)
Glucose: 71 mg/dL (ref 65–99)
Potassium: 4.5 mmol/L (ref 3.5–5.2)
Sodium: 139 mmol/L (ref 134–144)
Total Protein: 6.2 g/dL (ref 6.0–8.5)

## 2018-09-21 LAB — LIPID PANEL
Chol/HDL Ratio: 2.8 ratio (ref 0.0–4.4)
Cholesterol, Total: 211 mg/dL — ABNORMAL HIGH (ref 100–199)
HDL: 75 mg/dL (ref >39)
LDL Calculated: 108 mg/dL — ABNORMAL HIGH (ref 0–99)
Triglycerides: 142 mg/dL (ref 0–149)
VLDL Cholesterol Cal: 28 mg/dL (ref 5–40)

## 2018-09-21 LAB — T4 AND TSH
T4, Total: 5.8 ug/dL (ref 4.5–12.0)
TSH: 3.98 u[IU]/mL (ref 0.450–4.500)

## 2018-09-21 NOTE — Telephone Encounter (Signed)
LMTCB

## 2018-09-21 NOTE — Telephone Encounter (Signed)
Pt returned missed call.  Please call pt back. ° °Thanks, °TGH °

## 2018-09-21 NOTE — Telephone Encounter (Signed)
-----   Message from Mar Daring, [place] sent at 09/21/2018  8:59 AM EDT ----- All labs are within normal limits and stable.  Thanks! -JB

## 2018-09-21 NOTE — Telephone Encounter (Signed)
Patient advised as below.  

## 2018-12-06 ENCOUNTER — Ambulatory Visit
Admission: RE | Admit: 2018-12-06 | Discharge: 2018-12-06 | Disposition: A | Discharge status: Home / Self Care | Payer: Medicare Other | Source: Ambulatory Visit | Attending: Physician Assistant | Admitting: Physician Assistant

## 2018-12-06 DIAGNOSIS — Z1231 Encounter for screening mammogram for malignant neoplasm of breast: Secondary | ICD-10-CM | POA: Insufficient documentation

## 2018-12-06 DIAGNOSIS — Z1239 Encounter for other screening for malignant neoplasm of breast: Secondary | ICD-10-CM

## 2018-12-11 ENCOUNTER — Telehealth: Payer: Self-pay

## 2018-12-11 NOTE — Telephone Encounter (Signed)
LMTCB

## 2018-12-11 NOTE — Telephone Encounter (Signed)
Pt return call for mammo results. Please advise. Thanks TNP

## 2018-12-11 NOTE — Telephone Encounter (Signed)
-----   Message from Mar Daring, [place] sent at 12/11/2018  9:16 AM EST ----- Normal mammogram. Repeat screening in one year.

## 2018-12-11 NOTE — Telephone Encounter (Signed)
Patient advised as directed below. 

## 2019-01-10 ENCOUNTER — Ambulatory Visit: Payer: Medicare Other | Admitting: Adult Health

## 2019-01-13 IMAGING — MG MM DIGITAL SCREENING BILAT W/ TOMO W/ CAD
6 of 10 series · 6 of 30 positions shown · non-contrast
Comparison: Previous exam(s).

CLINICAL DATA: Screening.

EXAM:
DIGITAL SCREENING BILATERAL MAMMOGRAM WITH TOMO AND CAD

[R MLO synth-2D]
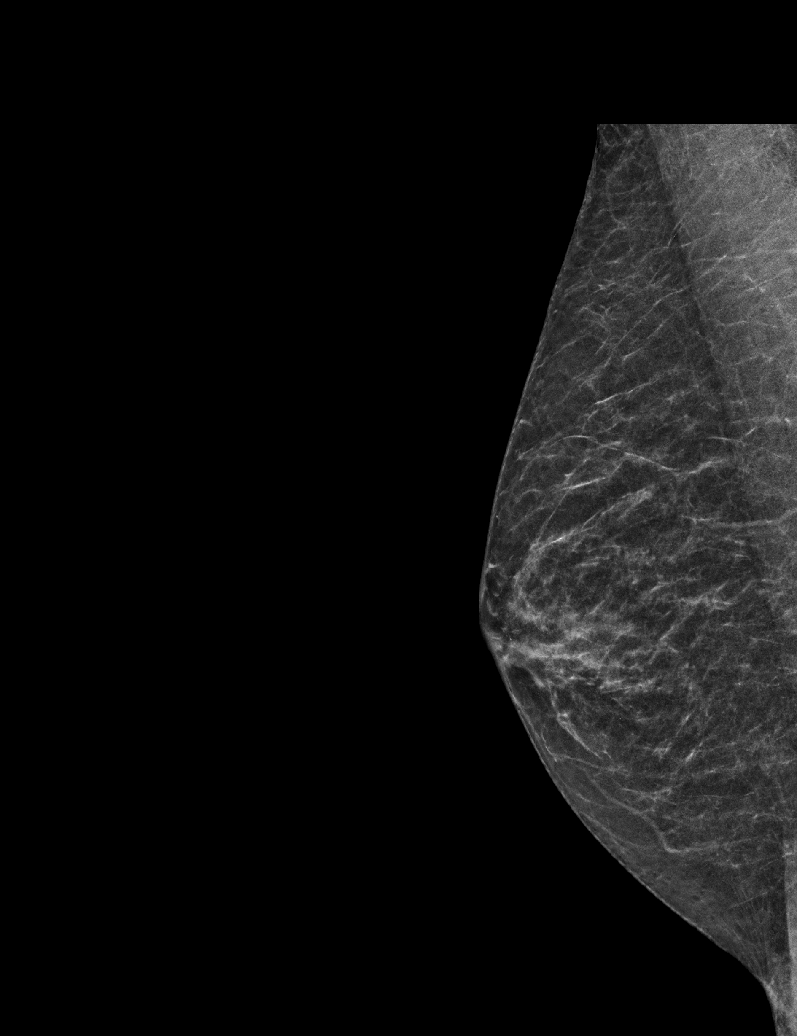

[L MLO synth-2D]
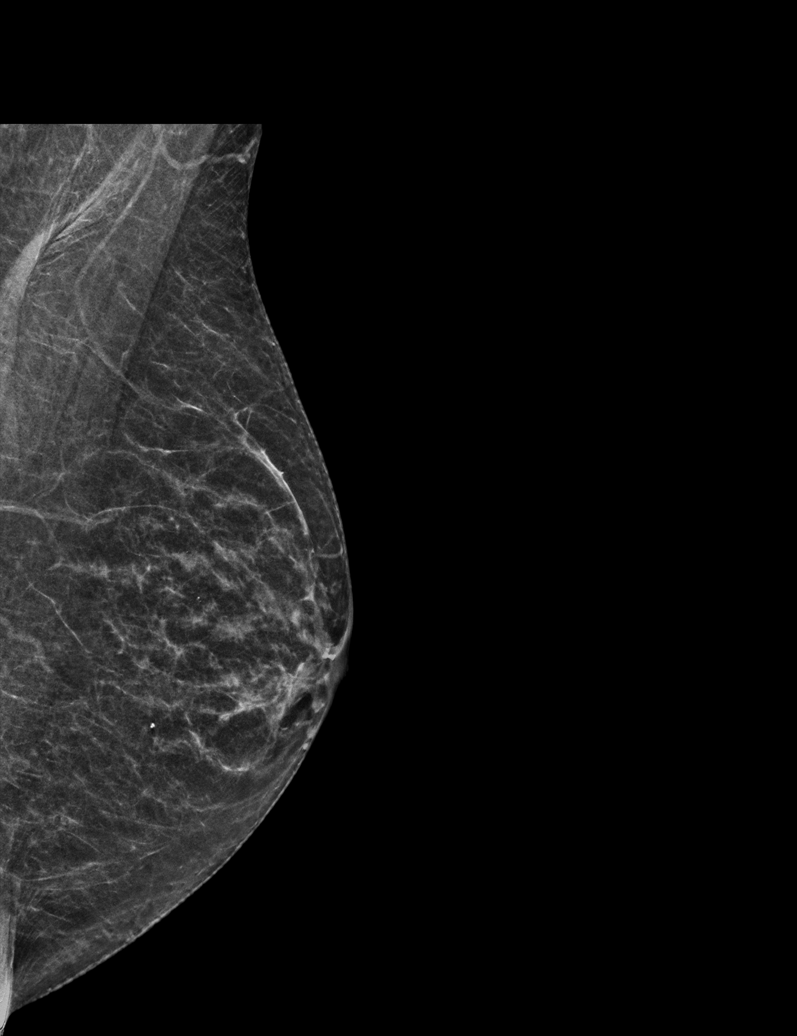

[L CC synth-2D (1 of 2)]
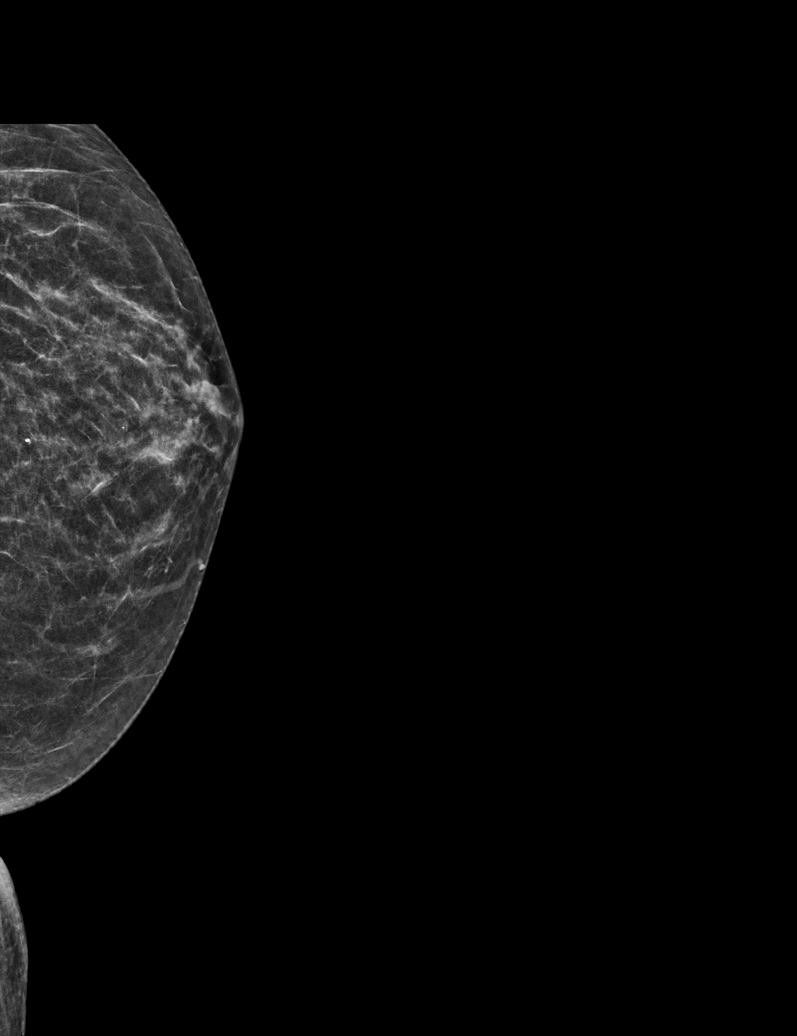

[R CC synth-2D]
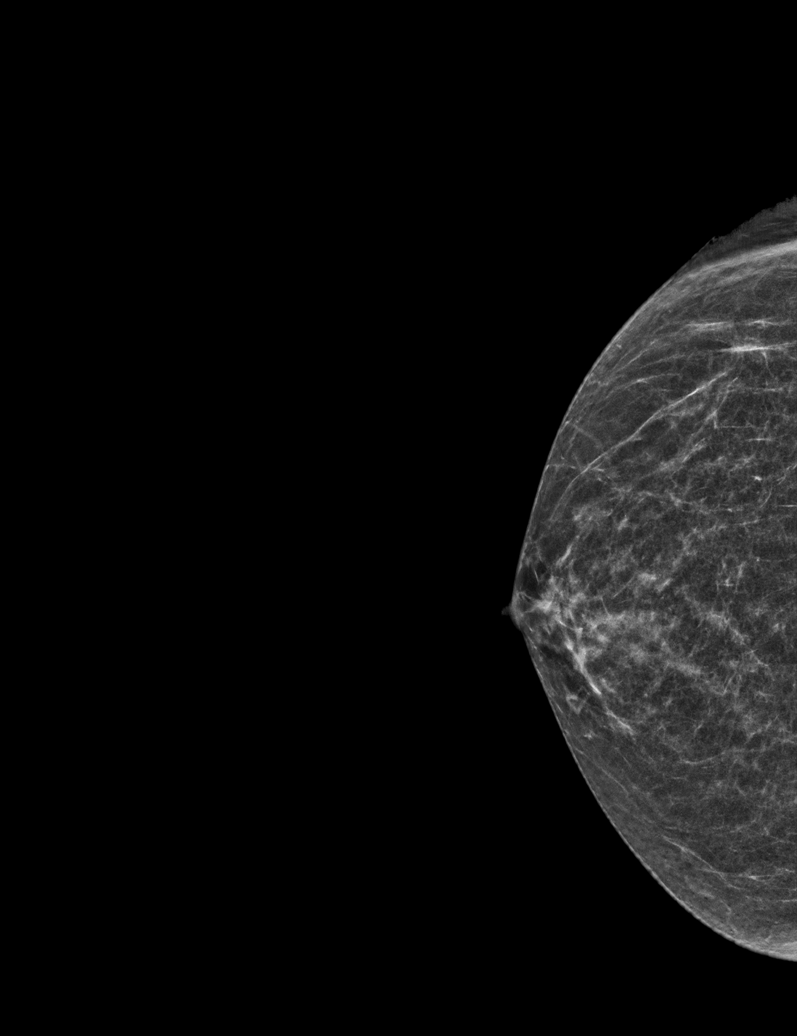

[L CC synth-2D (2 of 2)]
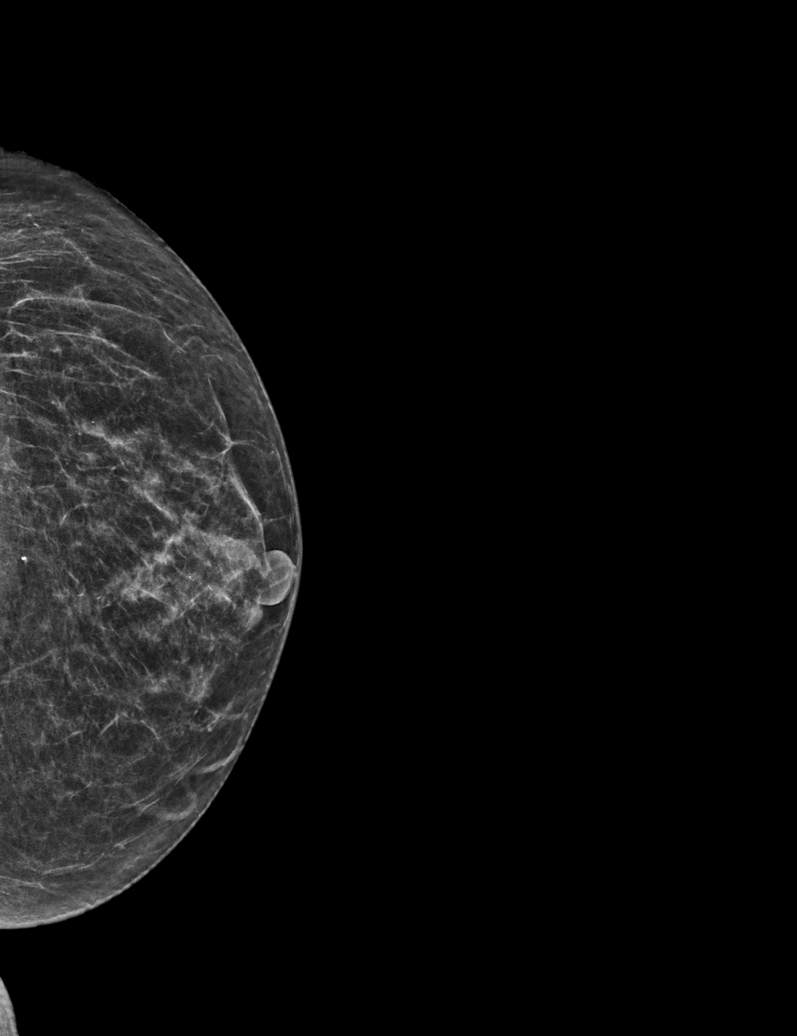

[L CC tomo · tomo slice 23/46.0]
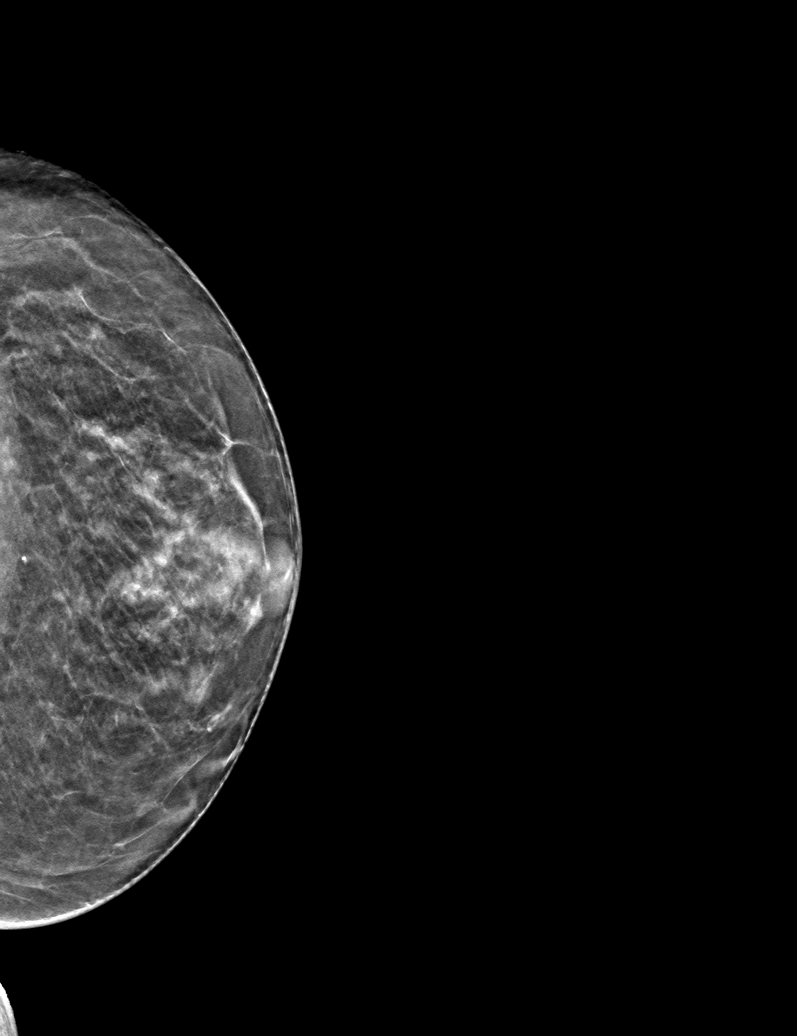

[6 of 30 positions shown; findings below may reference images not displayed]

ACR Breast Density Category b: There are scattered areas of
fibroglandular density.
FINDINGS: There are no findings suspicious for malignancy. Images were
processed with CAD.
IMPRESSION: No mammographic evidence of malignancy. A result letter of this
screening mammogram will be mailed directly to the patient.

RECOMMENDATION:
Screening mammogram in one year. (Code:[id number])

BI-RADS CATEGORY  1: Negative.

## 2019-01-25 ENCOUNTER — Ambulatory Visit: Payer: Medicare Other | Attending: Internal Medicine

## 2019-01-25 DIAGNOSIS — Z20822 Contact with and (suspected) exposure to covid-19: Secondary | ICD-10-CM

## 2019-01-26 LAB — NOVEL CORONAVIRUS, NAA: SARS-CoV-2, NAA: NOT DETECTED

## 2019-03-08 ENCOUNTER — Other Ambulatory Visit: Payer: Self-pay | Admitting: Physician Assistant

## 2019-03-08 DIAGNOSIS — I10 Essential (primary) hypertension: Secondary | ICD-10-CM

## 2019-03-12 ENCOUNTER — Telehealth: Payer: Self-pay | Admitting: Physician Assistant

## 2019-03-12 DIAGNOSIS — I10 Essential (primary) hypertension: Secondary | ICD-10-CM

## 2019-03-12 MED ORDER — LOSARTAN POTASSIUM 25 MG PO TABS: 25.0000 mg | ORAL_TABLET | Freq: Every day | ORAL | 1 refills | Status: DC

## 2019-03-12 NOTE — Telephone Encounter (Signed)
Received a fax from [facility] requesting a new prescription for a 90 day supply of losartan (COZAAR) 25 MG tablet.  Please advise

## 2019-03-12 NOTE — Telephone Encounter (Signed)
You sent in one for 30 days and note to pharmacy says that patient needs office visit Now they are requesting a 90 day supply.

## 2019-03-12 NOTE — Telephone Encounter (Signed)
refilled 

## 2019-07-30 ENCOUNTER — Ambulatory Visit: Payer: Medicare Other

## 2019-09-14 ENCOUNTER — Other Ambulatory Visit: Payer: Self-pay | Admitting: Physician Assistant

## 2019-09-20 ENCOUNTER — Other Ambulatory Visit: Payer: Self-pay

## 2019-09-20 ENCOUNTER — Ambulatory Visit: Payer: Medicare PPO | Admitting: Physician Assistant

## 2019-09-20 ENCOUNTER — Encounter: Payer: Self-pay | Admitting: Physician Assistant

## 2019-09-20 VITALS — BP 128/78 | HR 89 | Temp 99.1°F | Resp 16 | Wt 128.2 lb

## 2019-09-20 DIAGNOSIS — N3090 Cystitis, unspecified without hematuria: Secondary | ICD-10-CM

## 2019-09-20 LAB — POCT URINALYSIS DIPSTICK
Bilirubin, UA: NEGATIVE
Glucose, UA: NEGATIVE
Ketones, UA: NEGATIVE
Nitrite, UA: NEGATIVE
Protein, UA: NEGATIVE
Spec Grav, UA: 1.01 (ref 1.010–1.025)
Urobilinogen, UA: 0.2 E.U./dL
pH, UA: 6 (ref 5.0–8.0)

## 2019-09-20 MED ORDER — SULFAMETHOXAZOLE-TRIMETHOPRIM 400-80 MG PO TABS: 1.0000 | ORAL_TABLET | Freq: Two times a day (BID) | ORAL | 0 refills | Status: DC

## 2019-09-20 NOTE — Progress Notes (Signed)
Established patient visit   Patient: Courtney Willis   DOB: 1934-12-28   85 y.o. Female  MRN: [id number] Visit Date: 09/20/2019  Today's healthcare provider: Mar Daring, PA-C   Chief Complaint  Patient presents with  . Dysuria   Subjective    Dysuria  This is a new problem. Associated symptoms include frequency. Pertinent negatives include no nausea.    This is a new problem. The current episode started 1 to 4 weeks ago (3-4 weeks ago). The problem occurs every urination. The problem has been gradually improving. The quality of the pain is described as burning and aching. There has been no fever. Associated symptoms include frequency and urgency. Pertinent negatives include no chills, discharge, flank pain, hematuria, nausea or vomiting. She has tried increased fluids for the symptoms. The treatment provided no relief.   Patient Active Problem List   Diagnosis Date Noted  . Chronic venous insufficiency 07/26/2018  . Varicose veins of both lower extremities 07/26/2018  . Cerebral amyloid angiopathy ([place]) 02/13/2018  . Labile blood pressure   . SAH (subarachnoid hemorrhage) ([place]) 01/18/2018  . Intraparenchymal hemorrhage of brain ([place])   . Dyslipidemia   . Hyponatremia   . Tobacco abuse   . Hyperlipidemia   . Acute congestive heart failure with left ventricular diastolic dysfunction ([place])   . Acute blood loss anemia   . ICH (intracerebral hemorrhage) ([place]) 01/13/2018  . Subclinical hypothyroidism 07/20/2017  . Bunion 08/12/2014  . Cataract 08/12/2014  . CN (constipation) 08/12/2014  . Edema of foot 08/12/2014  . Fibroids, submucosal 08/12/2014  . Glaucoma 08/12/2014  . Coitalgia 08/12/2014  . 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 Avitaminosis D 08/12/2014  . Post menopausal syndrome 08/12/2014  . Alcohol use 08/06/2014  . Acid reflux 02/26/2008  . DD (diverticular disease) 02/16/2008  . Bowel disease 02/16/2008   Past Medical History:    Diagnosis Date  . Acid reflux 02/26/2008  . Arthritis   . 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 Avitaminosis D 08/12/2014  . Bowel disease 02/16/2008  . Bunion 08/12/2014  . Cancer [facility])    Skin cancer- basal- upper arm , upper chest  . CN (constipation) 08/12/2014  . DD (diverticular disease) 02/16/2008  . Elevated liver enzymes 08/12/2014  . Fibroids, submucosal 08/12/2014   of her lower lip   . Hypercholesteremia 08/12/2014  . 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 Post menopausal syndrome 08/12/2014  . Stroke [facility])        Medications: Outpatient Medications Prior to Visit  Medication Sig  . acetaminophen (TYLENOL) 500 MG tablet Take 500 mg by mouth every 6 (six) hours as needed.  Marland Kitchen azelastine (ASTELIN) 0.1 % nasal spray PLACE 1 SPRAY INTO THE NOSE 2 (TWO) TIMES DAILY. (Patient taking differently: Place 2 sprays into both nostrils as needed. )  . Calcium Carbonate-Vit D-Min (CALCIUM 1200 PO) Take by mouth daily.   . chlorpheniramine (ALLERGY RELIEF) 4 MG tablet Take 4 mg by mouth as needed for allergies.  . cholecalciferol (VITAMIN D3) 25 MCG (1000 UT) tablet Take 1,000 Units by mouth daily.  Marland Kitchen losartan (COZAAR) 25 MG tablet Take 1 tablet (25 mg total) by mouth daily.  Vladimir Faster Glycol-Propyl Glycol (SYSTANE) 0.4-0.3 % SOLN Apply to eye as needed.   . polyethylene glycol powder (GLYCOLAX/MIRALAX) powder Take 1 Container by mouth as needed.   Marland Kitchen Propylene Glycol (SYSTANE BALANCE) 0.6 % SOLN Apply to eye as needed.  . timolol (TIMOPTIC) 0.5 %  ophthalmic solution Place 1 drop into both eyes at bedtime.  . fluticasone (FLONASE) 50 MCG/ACT nasal spray SPRAY 2 SPRAYS INTO EACH NOSTRIL EVERY DAY (Patient not taking: Reported on 09/20/2019)   No facility-administered medications prior to visit.    Review of Systems  Constitutional: Negative.   Respiratory: Negative.   Cardiovascular: Negative.   Gastrointestinal: Negative.  Negative for nausea.  Genitourinary: Positive for dysuria, frequency and pelvic  pain.    Last CBC Lab Results  Component Value Date   WBC 5.9 09/24/2019   HGB 12.6 09/24/2019   HCT 36.8 09/24/2019   MCV 91 09/24/2019   MCH 31.2 09/24/2019   RDW 11.7 09/24/2019   PLT 285 19/37/9024   Last metabolic panel Lab Results  Component Value Date   GLUCOSE 91 09/24/2019   NA 138 09/24/2019   K 4.6 09/24/2019   CL 100 09/24/2019   CO2 24 09/24/2019   BUN 14 09/24/2019   CREATININE 1.06 (H) 09/24/2019   GFRNONAA 49 (L) 09/24/2019   GFRAA 56 (L) 09/24/2019   CALCIUM 9.4 09/24/2019   PROT 6.3 09/24/2019   ALBUMIN 4.2 09/24/2019   LABGLOB 2.1 09/24/2019   AGRATIO 2.0 09/24/2019   BILITOT 0.5 09/24/2019   ALKPHOS 70 09/24/2019   AST 20 09/24/2019   ALT 13 09/24/2019   ANIONGAP 10 01/24/2018      Objective    BP 128/78 (BP Location: Left Arm, Patient Position: Sitting, Cuff Size: Normal)   Pulse 89   Temp 99.1 F (37.3 C) (Oral)   Resp 16   Wt 128 lb 3.2 oz (58.2 kg)   BMI 19.49 kg/m  BP Readings from Last 3 Encounters:  09/24/19 128/74  09/20/19 128/78  09/20/18 138/70   Wt Readings from Last 3 Encounters:  09/24/19 130 lb (59 kg)  09/20/19 128 lb 3.2 oz (58.2 kg)  09/20/18 126 lb 3.2 oz (57.2 kg)      Physical Exam Vitals reviewed.  Constitutional:      General: She is not in acute distress.    Appearance: Normal appearance. She is well-developed and normal weight. She is not ill-appearing or diaphoretic.  Cardiovascular:     Rate and Rhythm: Normal rate and regular rhythm.     Heart sounds: Normal heart sounds. No murmur heard.  No friction rub. No gallop.   Pulmonary:     Effort: Pulmonary effort is normal. No respiratory distress.     Breath sounds: Normal breath sounds. No wheezing or rales.  Abdominal:     General: Bowel sounds are normal. There is no distension.     Palpations: Abdomen is soft. There is no mass.     Tenderness: There is abdominal tenderness in the suprapubic area. There is no right CVA tenderness, left CVA  tenderness, guarding or rebound.  Skin:    General: Skin is warm and dry.  Neurological:     Mental Status: She is alert and oriented to person, place, and time.       Results for orders placed or performed in visit on 09/20/19  Urine Culture   Specimen: Urine   Urine  Result Value Ref Range   Urine Culture, Routine Final report (A)    Organism ID, Bacteria Klebsiella pneumoniae (A)    Antimicrobial Susceptibility Comment   POCT urinalysis dipstick  Result Value Ref Range   Color, UA Yellow    Clarity, UA Clear    Glucose, UA Negative Negative   Bilirubin, UA Negative  Ketones, UA Negative    Spec Grav, UA 1.010 1.010 - 1.025   Blood, UA Trace    pH, UA 6.0 5.0 - 8.0   Protein, UA Negative Negative   Urobilinogen, UA 0.2 0.2 or 1.0 E.U./dL   Nitrite, UA Negative    Leukocytes, UA Large (3+) (A) Negative   Appearance     Odor      Assessment & Plan     1. Cystitis Worsening symptoms. UA positive. No systemic symptoms. Will treat empirically with Bactrim as below.  Continue to push fluids. Urine sent for culture. Will follow up pending C&S results. She is to call if symptoms do not improve or if they worsen.  - Urine Culture - sulfamethoxazole-trimethoprim (BACTRIM) 400-80 MG tablet; Take 1 tablet by mouth 2 (two) times daily.  Dispense: 10 tablet; Refill: 0  No follow-ups on file.      Reynolds Bowl, PA-C, have reviewed all documentation for this visit. The documentation on 09/26/19 for the exam, diagnosis, procedures, and orders are all accurate and complete.   Rubye Beach  [facility] [phone] (phone) [phone] (fax)  [place]

## 2019-09-23 LAB — URINE CULTURE

## 2019-09-24 ENCOUNTER — Other Ambulatory Visit: Payer: Self-pay

## 2019-09-24 ENCOUNTER — Encounter: Payer: Self-pay | Admitting: Physician Assistant

## 2019-09-24 ENCOUNTER — Ambulatory Visit (INDEPENDENT_AMBULATORY_CARE_PROVIDER_SITE_OTHER): Payer: Medicare PPO | Admitting: Physician Assistant

## 2019-09-24 VITALS — BP 128/74 | HR 69 | Temp 98.5°F | Ht 68.0 in | Wt 130.0 lb

## 2019-09-24 DIAGNOSIS — M810 Age-related osteoporosis without current pathological fracture: Secondary | ICD-10-CM

## 2019-09-24 DIAGNOSIS — E7800 Pure hypercholesterolemia, unspecified: Secondary | ICD-10-CM

## 2019-09-24 DIAGNOSIS — R7989 Other specified abnormal findings of blood chemistry: Secondary | ICD-10-CM

## 2019-09-24 DIAGNOSIS — N3090 Cystitis, unspecified without hematuria: Secondary | ICD-10-CM | POA: Diagnosis not present

## 2019-09-24 DIAGNOSIS — Z0000 Encounter for general adult medical examination without abnormal findings: Secondary | ICD-10-CM

## 2019-09-24 DIAGNOSIS — Z1239 Encounter for other screening for malignant neoplasm of breast: Secondary | ICD-10-CM | POA: Diagnosis not present

## 2019-09-24 DIAGNOSIS — I680 Cerebral amyloid angiopathy: Secondary | ICD-10-CM

## 2019-09-24 DIAGNOSIS — E854 Organ-limited amyloidosis: Secondary | ICD-10-CM

## 2019-09-24 DIAGNOSIS — I5032 Chronic diastolic (congestive) heart failure: Secondary | ICD-10-CM

## 2019-09-24 DIAGNOSIS — E559 Vitamin D deficiency, unspecified: Secondary | ICD-10-CM

## 2019-09-24 LAB — POCT URINALYSIS DIPSTICK
Bilirubin, UA: NEGATIVE
Blood, UA: NEGATIVE
Glucose, UA: NEGATIVE
Ketones, UA: NEGATIVE
Leukocytes, UA: NEGATIVE
Nitrite, UA: NEGATIVE
Protein, UA: NEGATIVE
Spec Grav, UA: 1.01 (ref 1.010–1.025)
Urobilinogen, UA: 0.2 E.U./dL
pH, UA: 7 (ref 5.0–8.0)

## 2019-09-24 NOTE — Patient Instructions (Signed)
Preventive Care 38 Years and Older, Female Preventive care refers to lifestyle choices and visits with your health care provider that can promote health and wellness. This includes:  A yearly physical exam. This is also called an annual well check.  Regular dental and eye exams.  Immunizations.  Screening for certain conditions.  Healthy lifestyle choices, such as diet and exercise. What can I expect for my preventive care visit? Physical exam Your health care provider will check:  Height and weight. These may be used to calculate body mass index (BMI), which is a measurement that tells if you are at a healthy weight.  Heart rate and blood pressure.  Your skin for abnormal spots. Counseling Your health care provider may ask you questions about:  Alcohol, tobacco, and drug use.  Emotional well-being.  Home and relationship well-being.  Sexual activity.  Eating habits.  History of falls.  Memory and ability to understand (cognition).  Work and work Statistician.  Pregnancy and menstrual history. What immunizations do I need?  Influenza (flu) vaccine  This is recommended every year. Tetanus, diphtheria, and pertussis (Tdap) vaccine  You may need a Td booster every 10 years. Varicella (chickenpox) vaccine  You may need this vaccine if you have not already been vaccinated. Zoster (shingles) vaccine  You may need this after age 33. Pneumococcal conjugate (PCV13) vaccine  One dose is recommended after age 33. Pneumococcal polysaccharide (PPSV23) vaccine  One dose is recommended after age 72. Measles, mumps, and rubella (MMR) vaccine  You may need at least one dose of MMR if you were born in 1957 or later. You may also need a second dose. Meningococcal conjugate (MenACWY) vaccine  You may need this if you have certain conditions. Hepatitis A vaccine  You may need this if you have certain conditions or if you travel or work in places where you may be exposed  to hepatitis A. Hepatitis B vaccine  You may need this if you have certain conditions or if you travel or work in places where you may be exposed to hepatitis B. Haemophilus influenzae type b (Hib) vaccine  You may need this if you have certain conditions. You may receive vaccines as individual doses or as more than one vaccine together in one shot (combination vaccines). Talk with your health care provider about the risks and benefits of combination vaccines. What tests do I need? Blood tests  Lipid and cholesterol levels. These may be checked every 5 years, or more frequently depending on your overall health.  Hepatitis C test.  Hepatitis B test. Screening  Lung cancer screening. You may have this screening every year starting at age 39 if you have a 30-pack-year history of smoking and currently smoke or have quit within the past 15 years.  Colorectal cancer screening. All adults should have this screening starting at age 36 and continuing until age 15. Your health care provider may recommend screening at age 23 if you are at increased risk. You will have tests every 1-10 years, depending on your results and the type of screening test.  Diabetes screening. This is done by checking your blood sugar (glucose) after you have not eaten for a while (fasting). You may have this done every 1-3 years.  Mammogram. This may be done every 1-2 years. Talk with your health care provider about how often you should have regular mammograms.  BRCA-related cancer screening. This may be done if you have a family history of breast, ovarian, tubal, or peritoneal cancers.  Other tests  Sexually transmitted disease (STD) testing.  Bone density scan. This is done to screen for osteoporosis. You may have this done starting at age 44. Follow these instructions at home: Eating and drinking  Eat a diet that includes fresh fruits and vegetables, whole grains, lean protein, and low-fat dairy products. Limit  your intake of foods with high amounts of sugar, saturated fats, and salt.  Take vitamin and mineral supplements as recommended by your health care provider.  Do not drink alcohol if your health care provider tells you not to drink.  If you drink alcohol: ? Limit how much you have to 0-1 drink a day. ? Be aware of how much alcohol is in your drink. In the [place] one drink equals one 12 oz bottle of beer (355 mL), one 5 oz glass of wine (148 mL), or one 1 oz glass of hard liquor (44 mL). Lifestyle  Take daily care of your teeth and gums.  Stay active. Exercise for at least 30 minutes on 5 or more days each week.  Do not use any products that contain nicotine or tobacco, such as cigarettes, e-cigarettes, and chewing tobacco. If you need help quitting, ask your health care provider.  If you are sexually active, practice safe sex. Use a condom or other form of protection in order to prevent STIs (sexually transmitted infections).  Talk with your health care provider about taking a low-dose aspirin or statin. What's next?  Go to your health care provider once a year for a well check visit.  Ask your health care provider how often you should have your eyes and teeth checked.  Stay up to date on all vaccines. This information is not intended to replace advice given to you by your health care provider. Make sure you discuss any questions you have with your health care provider. Document Revised: 01/12/2018 Document Reviewed: 01/12/2018 Elsevier Patient Education  2020 [employer].

## 2019-09-24 NOTE — Progress Notes (Signed)
Annual Wellness Visit     Patient: Courtney Willis, Female    DOB: 21-Apr-1934, 85 y.o.   MRN: [id number] Visit Date: 09/24/2019  Today's Provider: Mar Daring, PA-C   Chief Complaint  Patient presents with  . Annual Exam   Subjective    Courtney Willis is a 85 y.o. female who presents today for her Annual Wellness Visit. She reports consuming a general diet. Exercises some. She generally feels fairly well. She reports sleeping well. She does have additional problems to discuss today.   HPI   Patient Active Problem List   Diagnosis Date Noted  . Chronic venous insufficiency 07/26/2018  . Varicose veins of both lower extremities 07/26/2018  . Cerebral amyloid angiopathy ([place]) 02/13/2018  . Labile blood pressure   . SAH (subarachnoid hemorrhage) ([place]) 01/18/2018  . Intraparenchymal hemorrhage of brain ([place])   . Dyslipidemia   . Hyponatremia   . Tobacco abuse   . Hyperlipidemia   . Acute congestive heart failure with left ventricular diastolic dysfunction ([place])   . Acute blood loss anemia   . ICH (intracerebral hemorrhage) ([place]) 01/13/2018  . Subclinical hypothyroidism 07/20/2017  . Bunion 08/12/2014  . Cataract 08/12/2014  . CN (constipation) 08/12/2014  . Edema of foot 08/12/2014  . Fibroids, submucosal 08/12/2014  . Glaucoma 08/12/2014  . Hypercholesteremia 08/12/2014  . Coitalgia 08/12/2014  . 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 Avitaminosis D 08/12/2014  . Post menopausal syndrome 08/12/2014  . Alcohol use 08/06/2014  . Acid reflux 02/26/2008  . DD (diverticular disease) 02/16/2008  . Bowel disease 02/16/2008   Past Medical History:  Diagnosis Date  . Acid reflux 02/26/2008  . Arthritis   . 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 Avitaminosis D 08/12/2014  . Bowel disease 02/16/2008  . Bunion 08/12/2014  . Cancer [facility])    Skin cancer- basal- upper arm , upper chest  . CN (constipation) 08/12/2014  . DD (diverticular disease) 02/16/2008  .  Elevated liver enzymes 08/12/2014  . Fibroids, submucosal 08/12/2014   of her lower lip   . Hypercholesteremia 08/12/2014  . 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 Post menopausal syndrome 08/12/2014  . Stroke [facility])    Social History   Tobacco Use  . Smoking status: Former Research scientist (life sciences)  . Smokeless tobacco: Never Used  . Tobacco comment: (08/29/15) quit 40 years ago  Vaping Use  . Vaping Use: Never used  Substance Use Topics  . Alcohol use: Yes    Alcohol/week: 3.0 - 4.0 standard drinks    Types: 3 - 4 Glasses of wine per week  . Drug use: No   No Known Allergies   Medications: Outpatient Medications Prior to Visit  Medication Sig  . acetaminophen (TYLENOL) 500 MG tablet Take 500 mg by mouth every 6 (six) hours as needed.  Marland Kitchen azelastine (ASTELIN) 0.1 % nasal spray PLACE 1 SPRAY INTO THE NOSE 2 (TWO) TIMES DAILY. (Patient taking differently: Place 2 sprays into both nostrils as needed. )  . Calcium Carbonate-Vit D-Min (CALCIUM 1200 PO) Take by mouth daily.   . chlorpheniramine (ALLERGY RELIEF) 4 MG tablet Take 4 mg by mouth as needed for allergies.  . cholecalciferol (VITAMIN D3) 25 MCG (1000 UT) tablet Take 1,000 Units by mouth daily.  Marland Kitchen losartan (COZAAR) 25 MG tablet Take 1 tablet (25 mg total) by mouth daily.  Vladimir Faster Glycol-Propyl Glycol (SYSTANE) 0.4-0.3 % SOLN Apply to eye as needed.   . polyethylene glycol powder (GLYCOLAX/MIRALAX) powder  Take 1 Container by mouth as needed.   Marland Kitchen Propylene Glycol (SYSTANE BALANCE) 0.6 % SOLN Apply to eye as needed.  . sulfamethoxazole-trimethoprim (BACTRIM) 400-80 MG tablet Take 1 tablet by mouth 2 (two) times daily.  . timolol (TIMOPTIC) 0.5 % ophthalmic solution Place 1 drop into both eyes at bedtime.  . fluticasone (FLONASE) 50 MCG/ACT nasal spray SPRAY 2 SPRAYS INTO EACH NOSTRIL EVERY DAY (Patient not taking: Reported on 09/20/2019)   No facility-administered medications prior to visit.    No Known Allergies  Patient Care Team: Mar Daring,  PA-C as PCP - General (Family Medicine) Estill Cotta, MD as Consulting Physician (Ophthalmology) Dasher, Rayvon Char, MD as Consulting Physician (Dermatology) Frann Rider, NP as Nurse Practitioner (Neurology)  Review of Systems  Constitutional: Negative.   HENT: Positive for congestion and rhinorrhea. Negative for dental problem, drooling, ear discharge, ear pain, facial swelling, hearing loss, mouth sores, nosebleeds, postnasal drip, sinus pressure, sinus pain, sneezing, sore throat, tinnitus, trouble swallowing and voice change.   Eyes: Negative.   Respiratory: Positive for cough. Negative for apnea, choking, chest tightness, shortness of breath, wheezing and stridor.   Cardiovascular: Positive for leg swelling. Negative for chest pain and palpitations.  Gastrointestinal: Positive for constipation. Negative for abdominal distention, abdominal pain, anal bleeding, blood in stool, diarrhea, nausea, rectal pain and vomiting.  Genitourinary: Positive for difficulty urinating, dysuria and frequency. Negative for decreased urine volume, dyspareunia, enuresis, flank pain, genital sores, hematuria, menstrual problem, pelvic pain, urgency, vaginal bleeding, vaginal discharge and vaginal pain.  Musculoskeletal: Negative.   Skin: Negative.   Allergic/Immunologic: Positive for environmental allergies. Negative for food allergies and immunocompromised state.  Neurological: Negative.   Hematological: Negative.   Psychiatric/Behavioral: Positive for decreased concentration. Negative for agitation, behavioral problems, confusion, dysphoric mood, hallucinations, self-injury, sleep disturbance and suicidal ideas. The patient is not nervous/anxious and is not hyperactive.       Objective    Vitals: BP 128/74 (BP Location: Left Arm, Patient Position: Sitting, Cuff Size: Large)   Pulse 69   Temp 98.5 F (36.9 C) (Oral)   Ht 5\' 8"  (1.727 m)   Wt 130 lb (59 kg)   SpO2 100%   BMI 19.77 kg/m     Physical Exam Vitals reviewed.  Constitutional:      General: She is not in acute distress.    Appearance: Normal appearance. She is well-developed and normal weight. She is not ill-appearing or diaphoretic.  HENT:     Head: Normocephalic and atraumatic.     Right Ear: Tympanic membrane, ear canal and external ear normal.     Left Ear: Tympanic membrane, ear canal and external ear normal.     Nose: Nose normal.     Mouth/Throat:     Mouth: Mucous membranes are moist.     Pharynx: Oropharynx is clear. No oropharyngeal exudate.  Eyes:     General: No scleral icterus.       Right eye: No discharge.        Left eye: No discharge.     Extraocular Movements: Extraocular movements intact.     Conjunctiva/sclera: Conjunctivae normal.     Pupils: Pupils are equal, round, and reactive to light.  Neck:     Thyroid: No thyromegaly.     Vascular: No carotid bruit or JVD.     Trachea: No tracheal deviation.  Cardiovascular:     Rate and Rhythm: Normal rate and regular rhythm.     Pulses: Normal pulses.  Heart sounds: Normal heart sounds. No murmur heard.  No friction rub. No gallop.   Pulmonary:     Effort: Pulmonary effort is normal. No respiratory distress.     Breath sounds: Normal breath sounds. No wheezing or rales.  Chest:     Chest wall: No tenderness.  Abdominal:     General: Abdomen is flat. Bowel sounds are normal. There is no distension.     Palpations: Abdomen is soft. There is no mass.     Tenderness: There is no abdominal tenderness. There is no guarding or rebound.  Musculoskeletal:        General: No tenderness. Normal range of motion.     Cervical back: Normal range of motion and neck supple.     Right lower leg: No edema.     Left lower leg: No edema.  Lymphadenopathy:     Cervical: No cervical adenopathy.  Skin:    General: Skin is warm and dry.     Capillary Refill: Capillary refill takes less than 2 seconds.     Findings: No rash.  Neurological:      General: No focal deficit present.     Mental Status: She is alert and oriented to person, place, and time. Mental status is at baseline.  Psychiatric:        Mood and Affect: Mood normal.        Behavior: Behavior normal.        Thought Content: Thought content normal.        Judgment: Judgment normal.       Most recent functional status assessment: In your present state of health, do you have any difficulty performing the following activities: 09/24/2019  Hearing? N  Vision? N  Difficulty concentrating or making decisions? N  Walking or climbing stairs? Y  Dressing or bathing? N  Doing errands, shopping? N  Some recent data might be hidden   Most recent fall risk assessment: Fall Risk  09/20/2019  Falls in the past year? 0  Number falls in past yr: 0  Injury with Fall? 0  Comment -  Follow up Falls evaluation completed    Most recent depression screenings: PHQ 2/9 Scores 09/20/2019 07/25/2018  PHQ - 2 Score 0 0  PHQ- 9 Score - -   Most recent cognitive screening: 6CIT Screen 09/24/2019  What Year? 0 points  What month? 0 points  What time? 0 points  Count back from 20 0 points  Months in reverse 0 points  Repeat phrase 2 points  Total Score 2   Most recent Audit-C alcohol use screening Alcohol Use Disorder Test (AUDIT) 07/20/2017  1. How often do you have a drink containing alcohol? 1  2. How many drinks containing alcohol do you have on a typical day when you are drinking? 0  3. How often do you have six or more drinks on one occasion? 0  AUDIT-C Score 1   A score of 3 or more in women, and 4 or more in men indicates increased risk for alcohol abuse, EXCEPT if all of the points are from question 1   No results found for any visits on 09/24/19.  Assessment & Plan     Annual wellness visit done today including the all of the following: Reviewed patient's Family Medical History Reviewed and updated list of patient's medical providers Assessment of cognitive  impairment was done Assessed patient's functional ability Established a written schedule for health screening [place] Completed and  Reviewed  Exercise Activities and Dietary recommendations Goals    . Increase water intake     Recommend to continue current healthy diet and daily water intake of 6-8 glasses.         Immunization History  Administered Date(s) Administered  . Fluad Quad(high Dose 65+) 09/20/2018  . Influenza, High Dose Seasonal PF 11/24/2016  . Influenza-Unspecified 10/13/2017  . Pneumococcal Conjugate-13 07/02/2014  . Pneumococcal Polysaccharide-23 07/01/2004, 11/05/2004  . Td 01/14/2003, 04/08/2010  . Tdap 04/08/2010, 08/26/2015  . Zoster 03/11/2005  . Zoster Recombinat (Shingrix) 10/13/2017, 02/21/2018, 02/21/2018    Health Maintenance  Topic Date Due  . COVID-19 Vaccine (1) Never done  . DEXA SCAN  11/23/2018  . INFLUENZA VACCINE  09/02/2019  . COLONOSCOPY  02/16/2020  . TETANUS/TDAP  08/25/2025  . PNA vac Low Risk Adult  Completed     Discussed health benefits of physical activity, and encouraged her to engage in regular exercise appropriate for her age and condition.    1. Medicare annual wellness visit, subsequent Normal exam. Up to date on screenings and vaccinations.   2. Cystitis UA improved but patient still having pain. Will send for culture to make sure no secondary infection. Will f/u pending results.  - CULTURE, URINE COMPREHENSIVE  3. Encounter for breast cancer screening using non-mammogram modality There is no family history of breast cancer. She does perform regular self breast exams. Mammogram was ordered as below. Information for [facility] Breast clinic was given to patient so she may schedule her mammogram at her convenience. - MM 3D SCREEN BREAST BILATERAL; Future  4. Pure hypercholesterolemia Diet controlled. Will check labs as below and f/u pending results. - CBC w/Diff/Platelet - Comprehensive Metabolic  Panel (CMET) - Lipid Panel With LDL/HDL Ratio  5. Avitaminosis D H/O this. Postmenopausal. Continue Vit D 1000 IU daily. Will check labs as below and f/u pending results. - CBC w/Diff/Platelet - Vitamin D (25 hydroxy) - DG Bone Density; Future  6. Cerebral amyloid angiopathy (HCC) Stable. Will check labs as below and f/u pending results. - CBC w/Diff/Platelet - Comprehensive Metabolic Panel (CMET) - Lipid Panel With LDL/HDL Ratio  7. Chronic diastolic heart failure (HCC) Stable. No medication needed. Will check labs as below and f/u pending results. - CBC w/Diff/Platelet - Comprehensive Metabolic Panel (CMET) - Lipid Panel With LDL/HDL Ratio  8. Abnormal thyroid blood test H/O this. Asymptomatic. Will check labs as below and f/u pending results. - CBC w/Diff/Platelet - TSH  9. Age-related osteoporosis without current pathological fracture Due for repeat bone density. Ordered as below. Advised ok to schedule in Nov with her mammogram. - DG Bone Density; Future   No follow-ups on file.     Reynolds Bowl, PA-C, have reviewed all documentation for this visit. The documentation on 09/26/19 for the exam, diagnosis, procedures, and orders are all accurate and complete.   Rubye Beach  [facility] [phone] (phone) [phone] (fax)  [place]

## 2019-09-25 ENCOUNTER — Telehealth: Payer: Self-pay

## 2019-09-25 DIAGNOSIS — N289 Disorder of kidney and ureter, unspecified: Secondary | ICD-10-CM

## 2019-09-25 LAB — CBC WITH DIFFERENTIAL/PLATELET
Basophils Absolute: 0 10*3/uL (ref 0.0–0.2)
Basos: 1 %
EOS (ABSOLUTE): 0.2 10*3/uL (ref 0.0–0.4)
Eos: 3 %
Hematocrit: 36.8 % (ref 34.0–46.6)
Hemoglobin: 12.6 g/dL (ref 11.1–15.9)
Immature Grans (Abs): 0 10*3/uL (ref 0.0–0.1)
Immature Granulocytes: 1 %
Lymphocytes Absolute: 0.9 10*3/uL (ref 0.7–3.1)
Lymphs: 16 %
MCH: 31.2 pg (ref 26.6–33.0)
MCHC: 34.2 g/dL (ref 31.5–35.7)
MCV: 91 fL (ref 79–97)
Monocytes Absolute: 0.5 10*3/uL (ref 0.1–0.9)
Monocytes: 8 %
Neutrophils Absolute: 4.3 10*3/uL (ref 1.4–7.0)
Neutrophils: 71 %
Platelets: 285 10*3/uL (ref 150–450)
RBC: 4.04 x10E6/uL (ref 3.77–5.28)
RDW: 11.7 % (ref 11.7–15.4)
WBC: 5.9 10*3/uL (ref 3.4–10.8)

## 2019-09-25 LAB — COMPREHENSIVE METABOLIC PANEL
ALT: 13 IU/L (ref 0–32)
AST: 20 IU/L (ref 0–40)
Albumin/Globulin Ratio: 2 (ref 1.2–2.2)
Albumin: 4.2 g/dL (ref 3.6–4.6)
Alkaline Phosphatase: 70 IU/L (ref 48–121)
BUN/Creatinine Ratio: 13 (ref 12–28)
BUN: 14 mg/dL (ref 8–27)
Bilirubin Total: 0.5 mg/dL (ref 0.0–1.2)
CO2: 24 mmol/L (ref 20–29)
Calcium: 9.4 mg/dL (ref 8.7–10.3)
Chloride: 100 mmol/L (ref 96–106)
Creatinine, Ser: 1.06 mg/dL — ABNORMAL HIGH (ref 0.57–1.00)
GFR calc Af Amer: 56 mL/min/{1.73_m2} — ABNORMAL LOW (ref >59)
GFR calc non Af Amer: 49 mL/min/{1.73_m2} — ABNORMAL LOW (ref >59)
Globulin, Total: 2.1 g/dL (ref 1.5–4.5)
Glucose: 91 mg/dL (ref 65–99)
Potassium: 4.6 mmol/L (ref 3.5–5.2)
Sodium: 138 mmol/L (ref 134–144)
Total Protein: 6.3 g/dL (ref 6.0–8.5)

## 2019-09-25 LAB — LIPID PANEL WITH LDL/HDL RATIO
Cholesterol, Total: 192 mg/dL (ref 100–199)
HDL: 67 mg/dL (ref >39)
LDL Chol Calc (NIH): 110 mg/dL — ABNORMAL HIGH (ref 0–99)
LDL/HDL Ratio: 1.6 ratio (ref 0.0–3.2)
Triglycerides: 83 mg/dL (ref 0–149)
VLDL Cholesterol Cal: 15 mg/dL (ref 5–40)

## 2019-09-25 LAB — VITAMIN D 25 HYDROXY (VIT D DEFICIENCY, FRACTURES): Vit D, 25-Hydroxy: 51.6 ng/mL (ref 30.0–100.0)

## 2019-09-25 LAB — TSH: TSH: 4.52 u[IU]/mL — ABNORMAL HIGH (ref 0.450–4.500)

## 2019-09-25 NOTE — Telephone Encounter (Signed)
-----   Message from Mar Daring, [place] sent at 09/25/2019  9:55 AM EDT ----- Blood count is normal. Kidney function is slightly decreased compared to last check. This is most often associated with slight dehydration with fasting for labs. Push fluids. If desired we can recheck in 2-3 weeks. Sodium, potassium, and calcium are normal. Sugar is normal. Thyroid is ok for age. Cholesterol is normal and improved compared to last year. Vit D is normal.

## 2019-09-25 NOTE — Telephone Encounter (Signed)
Patient advised as directed. °

## 2019-09-26 ENCOUNTER — Encounter: Payer: Self-pay | Admitting: Physician Assistant

## 2019-09-27 ENCOUNTER — Telehealth: Payer: Self-pay

## 2019-09-27 LAB — CULTURE, URINE COMPREHENSIVE

## 2019-09-27 NOTE — Telephone Encounter (Signed)
-----   Message from Mar Daring, [place] sent at 09/27/2019  6:15 PM EDT ----- New urine culture had no bacterial growth. UTI cleared.

## 2019-09-27 NOTE — Telephone Encounter (Signed)
Patient advised as directed below. 

## 2019-10-04 ENCOUNTER — Other Ambulatory Visit: Payer: Self-pay | Admitting: Physician Assistant

## 2019-10-04 DIAGNOSIS — I10 Essential (primary) hypertension: Secondary | ICD-10-CM

## 2019-10-22 ENCOUNTER — Encounter: Payer: Self-pay | Admitting: Emergency Medicine

## 2019-10-22 ENCOUNTER — Ambulatory Visit: Payer: Self-pay | Admitting: Adult Health

## 2019-10-22 ENCOUNTER — Ambulatory Visit: Payer: Self-pay | Admitting: *Deleted

## 2019-10-22 ENCOUNTER — Emergency Department: Payer: Medicare PPO

## 2019-10-22 ENCOUNTER — Other Ambulatory Visit: Payer: Self-pay

## 2019-10-22 ENCOUNTER — Emergency Department
Admission: EM | Admit: 2019-10-22 | Discharge: 2019-10-22 | Disposition: A | Discharge status: Home / Self Care | Payer: Medicare PPO | Attending: Emergency Medicine | Admitting: Emergency Medicine

## 2019-10-22 DIAGNOSIS — Z87891 Personal history of nicotine dependence: Secondary | ICD-10-CM | POA: Insufficient documentation

## 2019-10-22 DIAGNOSIS — R251 Tremor, unspecified: Secondary | ICD-10-CM

## 2019-10-22 DIAGNOSIS — R4701 Aphasia: Secondary | ICD-10-CM | POA: Diagnosis present

## 2019-10-22 DIAGNOSIS — I110 Hypertensive heart disease with heart failure: Secondary | ICD-10-CM | POA: Insufficient documentation

## 2019-10-22 DIAGNOSIS — Z79899 Other long term (current) drug therapy: Secondary | ICD-10-CM | POA: Diagnosis not present

## 2019-10-22 DIAGNOSIS — Z85828 Personal history of other malignant neoplasm of skin: Secondary | ICD-10-CM | POA: Diagnosis not present

## 2019-10-22 DIAGNOSIS — I501 Left ventricular failure: Secondary | ICD-10-CM | POA: Diagnosis not present

## 2019-10-22 DIAGNOSIS — R479 Unspecified speech disturbances: Secondary | ICD-10-CM

## 2019-10-22 DIAGNOSIS — R4789 Other speech disturbances: Secondary | ICD-10-CM | POA: Insufficient documentation

## 2019-10-22 DIAGNOSIS — I5031 Acute diastolic (congestive) heart failure: Secondary | ICD-10-CM | POA: Diagnosis not present

## 2019-10-22 LAB — COMPREHENSIVE METABOLIC PANEL
ALT: 15 U/L (ref 0–44)
AST: 18 U/L (ref 15–41)
Albumin: 4.3 g/dL (ref 3.5–5.0)
Alkaline Phosphatase: 53 U/L (ref 38–126)
Anion gap: 9 (ref 5–15)
BUN: 13 mg/dL (ref 8–23)
CO2: 28 mmol/L (ref 22–32)
Calcium: 9.2 mg/dL (ref 8.9–10.3)
Chloride: 101 mmol/L (ref 98–111)
Creatinine, Ser: 0.66 mg/dL (ref 0.44–1.00)
GFR calc Af Amer: >60 mL/min (ref >60)
GFR calc non Af Amer: >60 mL/min (ref >60)
Glucose, Bld: 114 mg/dL — ABNORMAL HIGH (ref 70–99)
Potassium: 3.9 mmol/L (ref 3.5–5.1)
Sodium: 138 mmol/L (ref 135–145)
Total Bilirubin: 1.5 mg/dL — ABNORMAL HIGH (ref 0.3–1.2)
Total Protein: 7.1 g/dL (ref 6.5–8.1)

## 2019-10-22 LAB — DIFFERENTIAL
Abs Immature Granulocytes: 0.01 10*3/uL (ref 0.00–0.07)
Basophils Absolute: 0 10*3/uL (ref 0.0–0.1)
Basophils Relative: 0 %
Eosinophils Absolute: 0 10*3/uL (ref 0.0–0.5)
Eosinophils Relative: 1 %
Immature Granulocytes: 0 %
Lymphocytes Relative: 23 %
Lymphs Abs: 1.3 10*3/uL (ref 0.7–4.0)
Monocytes Absolute: 0.4 10*3/uL (ref 0.1–1.0)
Monocytes Relative: 7 %
Neutro Abs: 4 10*3/uL (ref 1.7–7.7)
Neutrophils Relative %: 69 %

## 2019-10-22 LAB — CBC
HCT: 39.4 % (ref 36.0–46.0)
Hemoglobin: 12.9 g/dL (ref 12.0–15.0)
MCH: 31.4 pg (ref 26.0–34.0)
MCHC: 32.7 g/dL (ref 30.0–36.0)
MCV: 95.9 fL (ref 80.0–100.0)
Platelets: 253 10*3/uL (ref 150–400)
RBC: 4.11 MIL/uL (ref 3.87–5.11)
RDW: 12.6 % (ref 11.5–15.5)
WBC: 5.8 10*3/uL (ref 4.0–10.5)
nRBC: 0 % (ref 0.0–0.2)

## 2019-10-22 LAB — PROTIME-INR
INR: 0.9 (ref 0.8–1.2)
Prothrombin Time: 12.1 seconds (ref 11.4–15.2)

## 2019-10-22 LAB — APTT: aPTT: 26 seconds (ref 24–36)

## 2019-10-22 NOTE — Telephone Encounter (Signed)
This is old. Patient went to [facility] and to ER for stroke evaluation

## 2019-10-22 NOTE — ED Triage Notes (Signed)
Patient states she was eating brunch at [employer] this am and developed agitation and slurred speech. Patient states s/s lasted approx 30-45 minutes. Symptoms have improved, but patient states she is unable to drive a car at this time. Patient reports h/o "brain bleed" in December of 2019.

## 2019-10-22 NOTE — Telephone Encounter (Signed)
Patient calls with babbled speech for 10-15 minutes around 11:30am. Lightheadedness now, "just not quite right feeling". No other symptoms. Hx of stroke 12/19. EMS evaluated patient-no distress noted. PCP not available, appointment scheduled with M.Flinchum, NP for 2:56m today.Instructed patient not to drive herself-understood and should any other symptoms develop prior to this appointment to seek evaluation at the nearest ED.

## 2019-10-22 NOTE — Telephone Encounter (Signed)
°  Reason for Disposition  [1] Loss of speech or garbled speech AND [2] sudden onset AND [3] brief (now gone)  Protocols used: NEUROLOGIC DEFICIT-A-AH

## 2019-10-22 NOTE — ED Provider Notes (Signed)
[facility] Emergency Department Provider Note   ____________________________________________   First MD Initiated Contact with Patient 10/22/19 1838     (approximate)  I have reviewed the triage vital signs and the nursing notes.   HISTORY  Chief Complaint Aphasia    HPI Courtney Willis is a 85 y.o. female with a stated past medical history of GERD, hypertension, and "brain bleed" 2 years prior to arrival who presents for an episode of 30-45 seconds of pressured speech and bilateral upper extremity tremors that occurred this AM.  A witness to this events state that patient was making full sentences but felt that she had "compulsion to speak".  Witness also states that patient had shaking in bilateral upper extremities with a look like she "could not stop moving".  Patient denies having any similar symptoms in the past and denies having any symptoms similar to this since this event.  Patient denies any weakness/numbness/paresthesias in any extremity, vision changes, tinnitus, facial droop, or difficulty with ambulation         Past Medical History:  Diagnosis Date  . Acid reflux 02/26/2008  . Arthritis   . 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 Avitaminosis D 08/12/2014  . Bowel disease 02/16/2008  . Bunion 08/12/2014  . Cancer [facility])    Skin cancer- basal- upper arm , upper chest  . CN (constipation) 08/12/2014  . DD (diverticular disease) 02/16/2008  . Elevated liver enzymes 08/12/2014  . Fibroids, submucosal 08/12/2014   of her lower lip   . Hypercholesteremia 08/12/2014  . 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 Post menopausal syndrome 08/12/2014  . Stroke [facility])     Patient Active Problem List   Diagnosis Date Noted  . Chronic venous insufficiency 07/26/2018  . Varicose veins of both lower extremities 07/26/2018  . Cerebral amyloid angiopathy ([place]) 02/13/2018  . Labile blood pressure   . SAH (subarachnoid hemorrhage) ([place]) 01/18/2018  . Intraparenchymal hemorrhage of  brain ([place])   . Dyslipidemia   . Hyponatremia   . Tobacco abuse   . Hyperlipidemia   . Acute congestive heart failure with left ventricular diastolic dysfunction ([place])   . Acute blood loss anemia   . ICH (intracerebral hemorrhage) ([place]) 01/13/2018  . Subclinical hypothyroidism 07/20/2017  . Bunion 08/12/2014  . Cataract 08/12/2014  . CN (constipation) 08/12/2014  . Edema of foot 08/12/2014  . Fibroids, submucosal 08/12/2014  . Glaucoma 08/12/2014  . Coitalgia 08/12/2014  . 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 Avitaminosis D 08/12/2014  . Post menopausal syndrome 08/12/2014  . Alcohol use 08/06/2014  . Acid reflux 02/26/2008  . DD (diverticular disease) 02/16/2008  . Bowel disease 02/16/2008    Past Surgical History:  Procedure Laterality Date  . BUNIONECTOMY     05/2004, 2007  . CATARACT EXTRACTION     04/2005, 06/2005  . COLONOSCOPY W/ POLYPECTOMY    . COLONOSCOPY WITH PROPOFOL N/A 02/15/2017   Procedure: COLONOSCOPY WITH PROPOFOL;  Surgeon: Jonathon Bellows, MD;  Location: [facility] ENDOSCOPY;  Service: Gastroenterology;  Laterality: N/A;  . HYSTEROSCOPY  2011  . ORIF WRIST FRACTURE Right 08/30/2015   Procedure: OPEN REDUCTION INTERNAL FIXATION (ORIF) RIGHT WRIST FRACTURE AND REPAIR AS NECESSARY;  Surgeon: Roseanne Kaufman, MD;  Location: [place];  Service: Orthopedics;  Laterality: Right;  . UPPER GI ENDOSCOPY    . WRIST FRACTURE SURGERY Left    06/2006    Prior to Admission medications   Medication Sig Start Date End Date Taking? Authorizing  Provider  acetaminophen (TYLENOL) 500 MG tablet Take 500 mg by mouth every 6 (six) hours as needed.    [provider]  azelastine (ASTELIN) 0.1 % nasal spray PLACE 1 SPRAY INTO THE NOSE 2 (TWO) TIMES DAILY. Patient taking differently: Place 2 sprays into both nostrils as needed.  07/03/18   Mar Daring, PA-C  Calcium Carbonate-Vit D-Min (CALCIUM 1200 PO) Take by mouth daily.     [provider]    chlorpheniramine (ALLERGY RELIEF) 4 MG tablet Take 4 mg by mouth as needed for allergies.    [provider]  cholecalciferol (VITAMIN D3) 25 MCG (1000 UT) tablet Take 1,000 Units by mouth daily.    [provider]  fluticasone (FLONASE) 50 MCG/ACT nasal spray SPRAY 2 SPRAYS INTO EACH NOSTRIL EVERY DAY Patient not taking: Reported on 09/20/2019 09/14/19   Mar Daring, PA-C  losartan (COZAAR) 25 MG tablet TAKE 1 TABLET BY MOUTH EVERY DAY 10/04/19   Mar Daring, PA-C  Polyethyl Glycol-Propyl Glycol (SYSTANE) 0.4-0.3 % SOLN Apply to eye as needed.     [provider]  polyethylene glycol powder (GLYCOLAX/MIRALAX) powder Take 1 Container by mouth as needed.     [provider]  Propylene Glycol (SYSTANE BALANCE) 0.6 % SOLN Apply to eye as needed.    [provider]  sulfamethoxazole-trimethoprim (BACTRIM) 400-80 MG tablet Take 1 tablet by mouth 2 (two) times daily. 09/20/19   Mar Daring, PA-C  timolol (TIMOPTIC) 0.5 % ophthalmic solution Place 1 drop into both eyes at bedtime. 07/05/14   [provider]    Allergies Patient has no known allergies.  Family History  Problem Relation Age of Onset  . Rheum arthritis Mother   . Lung cancer Father   . Ulcers Father   . Hodgkin's lymphoma Sister   . AVM Daughter   . Breast cancer Paternal Grandmother     Social History Social History   Tobacco Use  . Smoking status: Former Research scientist (life sciences)  . Smokeless tobacco: Never Used  . Tobacco comment: (08/29/15) quit 40 years ago  Vaping Use  . Vaping Use: Never used  Substance Use Topics  . Alcohol use: Yes    Alcohol/week: 3.0 - 4.0 standard drinks    Types: 3 - 4 Glasses of wine per week  . Drug use: No    Review of Systems Constitutional: No fever/chills Eyes: No visual changes. ENT: No sore throat. Cardiovascular: Denies chest pain. Respiratory: Denies shortness of breath. Gastrointestinal: No abdominal pain.  No nausea,  no vomiting.  No diarrhea. Genitourinary: Negative for dysuria. Musculoskeletal: Negative for acute arthralgias Skin: Negative for rash. Neurological: Negative for headaches, weakness/numbness/paresthesias in any extremity Psychiatric: Negative for suicidal ideation/homicidal ideation   ____________________________________________   PHYSICAL EXAM:  VITAL SIGNS: ED Triage Vitals  Enc Vitals Group     BP 10/22/19 1438 (!) 169/77     Pulse Rate 10/22/19 1438 68     Resp 10/22/19 1438 18     Temp 10/22/19 1438 97.8 F (36.6 C)     Temp Source 10/22/19 1438 Oral     SpO2 10/22/19 1438 100 %     Weight 10/22/19 1440 135 lb (61.2 kg)     Height 10/22/19 1440 5\' 8"  (1.727 m)     Head Circumference --      Peak Flow --      Pain Score 10/22/19 1440 0     Pain Loc --      Pain  Edu? --      Excl. in [place]? --    Constitutional: Alert and oriented. Well appearing and in no acute distress. Eyes: Conjunctivae are normal. PERRL. EOMI. Head: Atraumatic. Nose: No congestion/rhinnorhea. Mouth/Throat: Mucous membranes are moist. Neck: No stridor Cardiovascular: Normal rate, regular rhythm. Grossly normal heart sounds.  Good peripheral circulation. Respiratory: Normal respiratory effort.  No retractions. Gastrointestinal: Soft and nontender. No distention. Musculoskeletal: No lower extremity tenderness nor edema.  No joint effusions. Neurologic:  Normal speech and language. No gross focal neurologic deficits are appreciated. Skin:  Skin is warm and dry. No rash noted. Psychiatric: Mood and affect are normal. Speech and behavior are normal.  ____________________________________________   LABS (all labs ordered are listed, but only abnormal results are displayed)  Labs Reviewed  COMPREHENSIVE METABOLIC PANEL - Abnormal; Notable for the following components:      Result Value   Glucose, Bld 114 (*)    Total Bilirubin 1.5 (*)    All other components within normal limits  PROTIME-INR    APTT  CBC  DIFFERENTIAL   ____________________________________________  EKG  ED ECG REPORT I, Naaman Plummer, the attending physician, personally viewed and interpreted this ECG.  Date: 10/22/2019 EKG Time: 1446 Rate: 64 Rhythm: normal sinus rhythm QRS Axis: normal Intervals: normal ST/T Wave abnormalities: normal Narrative Interpretation: no evidence of acute ischemia  ____________________________________________  RADIOLOGY  ED MD interpretation: CT noncontrast of the head shows generalized cerebral atrophy, chronic left frontal lobe infarct, and no evidence of acute bleeding or gross structural abnormalities  Official radiology report(s): CT HEAD WO CONTRAST  Result Date: 10/22/2019 CLINICAL DATA:  Altered mental status. EXAM: CT HEAD WITHOUT CONTRAST TECHNIQUE: Contiguous axial images were obtained from the base of the skull through the vertex without intravenous contrast. COMPARISON:  January 15, 2018 FINDINGS: Brain: There is mild cerebral atrophy with widening of the extra-axial spaces and ventricular dilatation. There are areas of decreased attenuation within the white matter tracts of the supratentorial brain, consistent with microvascular disease changes. A small to moderate sized area of cortical encephalomalacia, with adjacent chronic white matter low attenuation, is seen within the left frontal lobe. This area contains 3 mm and 4 mm calcification. Vascular: No hyperdense vessel or unexpected calcification. Skull: Normal. Negative for fracture or focal lesion. Sinuses/Orbits: No acute finding. Other: None. IMPRESSION: 1. Generalized cerebral atrophy. 2. Chronic left frontal lobe infarct. 3. No acute intracranial abnormality. Electronically Signed   By: Virgina Norfolk M.D.   On: 10/22/2019 15:22    ____________________________________________   PROCEDURES  Procedure(s) performed (including Critical  Care):  Procedures   ____________________________________________   INITIAL IMPRESSION / ASSESSMENT AND PLAN / ED COURSE      The patient suffered an episode of altered mental status, but there is no overt concern for a dangerous emergent cause such as, but not limited to, CNS infection, severe Toxidrome, severe metabolic derangement, or stroke.  Given History, Physical, and Workup the cause is not readily apparent.  Patient does have evidence of chronic changes from a stroke on the left side.  As patient is right-handed, it is possible the patient had hypoperfusion of this region resulting in abnormal speech symptoms  Disposition: Discharge. At the time of discharge, the patient is back to baseline mental status.      ____________________________________________   FINAL CLINICAL IMPRESSION(S) / ED DIAGNOSES  Final diagnoses:  Speech disturbance, unspecified type  Tremor, unspecified     ED Discharge Orders    None  Note:  This document was prepared using Dragon voice recognition software and may include unintentional dictation errors.   Naaman Plummer, MD 10/22/19 1910

## 2019-10-22 NOTE — ED Notes (Signed)
Bradler MD to bedside at this time

## 2019-10-22 NOTE — Telephone Encounter (Signed)
Yes saw on schedule and had Nunzio Cory call advise 911 / ER Thanks

## 2019-10-23 ENCOUNTER — Telehealth: Payer: Self-pay

## 2019-10-23 DIAGNOSIS — I680 Cerebral amyloid angiopathy: Secondary | ICD-10-CM

## 2019-10-23 DIAGNOSIS — R4701 Aphasia: Secondary | ICD-10-CM

## 2019-10-23 DIAGNOSIS — E854 Organ-limited amyloidosis: Secondary | ICD-10-CM

## 2019-10-23 DIAGNOSIS — I611 Nontraumatic intracerebral hemorrhage in hemisphere, cortical: Secondary | ICD-10-CM

## 2019-10-23 NOTE — Telephone Encounter (Signed)
Referral placed.

## 2019-10-23 NOTE — Telephone Encounter (Signed)
Copied from [place] [id number]. Topic: Referral - Request for Referral >> Oct 23, 2019 12:32 PM Gillis Ends D wrote: Has patient seen PCP for this complaint? No. *If NO, is insurance requiring patient see PCP for this issue before PCP can refer them? Referral for which specialty: [facility] Preferred provider/office: [address] in [place] Reason for referral: had a cat scan but wants an MRI

## 2019-10-24 NOTE — Telephone Encounter (Signed)
Patient advised.

## 2019-11-14 ENCOUNTER — Telehealth: Payer: Self-pay | Admitting: Physician Assistant

## 2019-11-14 NOTE — Telephone Encounter (Signed)
Copied from [place] [id number]. Topic: Medicare AWV >> Nov 14, 2019 10:28 AM Cher Nakai R wrote: Reason for CRM:   Left message for patient to call back and schedule Medicare Annual Wellness Visit (AWV) either virtually or in office.  Last AWV 07/25/2018  Please schedule at anytime with [facility] Health Advisor.  If any questions, please contact me at [phone]

## 2019-11-22 ENCOUNTER — Ambulatory Visit
Admission: RE | Admit: 2019-11-22 | Discharge: 2019-11-22 | Disposition: A | Discharge status: Home / Self Care | Payer: Medicare PPO | Source: Ambulatory Visit | Attending: Physician Assistant | Admitting: Physician Assistant

## 2019-11-22 ENCOUNTER — Other Ambulatory Visit: Payer: Self-pay

## 2019-11-22 DIAGNOSIS — E559 Vitamin D deficiency, unspecified: Secondary | ICD-10-CM | POA: Diagnosis not present

## 2019-11-22 DIAGNOSIS — M810 Age-related osteoporosis without current pathological fracture: Secondary | ICD-10-CM | POA: Diagnosis present

## 2019-11-26 ENCOUNTER — Telehealth: Payer: Self-pay

## 2019-11-26 DIAGNOSIS — M810 Age-related osteoporosis without current pathological fracture: Secondary | ICD-10-CM

## 2019-11-26 NOTE — Telephone Encounter (Signed)
-----   Message from Mar Daring, [place] sent at 11/22/2019 12:52 PM EDT ----- Bone density has declined again. T score went from -4.5 in 2018 to now -4.8. Would recommend referral to endocrinology for consideration of treatment with some of the newer agents for osteoporosis. If agreeable, will place referral.

## 2019-12-09 ENCOUNTER — Other Ambulatory Visit: Payer: Self-pay | Admitting: Physician Assistant

## 2019-12-09 NOTE — Telephone Encounter (Signed)
Requested Prescriptions  Pending Prescriptions Disp Refills   fluticasone (FLONASE) 50 MCG/ACT nasal spray [Pharmacy Med Name: FLUTICASONE PROP 50 MCG SPRAY] 48 mL 3    Sig: SPRAY 2 SPRAYS INTO EACH NOSTRIL EVERY DAY     Ear, Nose, and Throat: Nasal Preparations - Corticosteroids Passed - 12/09/2019 12:49 AM      Passed - Valid encounter within last 12 months    Recent Outpatient Visits          2 months ago Medicare annual wellness visit, subsequent   [facility], [place], [place]   2 months ago [place], Clearnce Sorrel, [place]   1 year ago Annual physical exam   [place], Clearnce Sorrel, [place]   1 year ago Essential hypertension   [place], Clearnce Sorrel, [place]   1 year ago Essential hypertension   [place], Clearnce Sorrel, [place]      Future Appointments            In 3 months [place], Clearnce Sorrel, PA-C [employer], [place]

## 2019-12-11 ENCOUNTER — Ambulatory Visit
Admission: RE | Admit: 2019-12-11 | Discharge: 2019-12-11 | Disposition: A | Discharge status: Home / Self Care | Payer: Medicare PPO | Source: Ambulatory Visit | Attending: Physician Assistant | Admitting: Physician Assistant

## 2019-12-11 ENCOUNTER — Other Ambulatory Visit: Payer: Self-pay

## 2019-12-11 DIAGNOSIS — Z1231 Encounter for screening mammogram for malignant neoplasm of breast: Secondary | ICD-10-CM | POA: Insufficient documentation

## 2019-12-11 DIAGNOSIS — Z1239 Encounter for other screening for malignant neoplasm of breast: Secondary | ICD-10-CM

## 2020-01-01 ENCOUNTER — Encounter: Payer: Self-pay | Admitting: Neurology

## 2020-01-01 ENCOUNTER — Ambulatory Visit: Payer: Medicare PPO | Admitting: Neurology

## 2020-01-01 VITALS — BP 158/84 | HR 87 | Ht 68.0 in | Wt 127.0 lb

## 2020-01-01 DIAGNOSIS — I618 Other nontraumatic intracerebral hemorrhage: Secondary | ICD-10-CM | POA: Diagnosis not present

## 2020-01-01 DIAGNOSIS — Z8679 Personal history of other diseases of the circulatory system: Secondary | ICD-10-CM | POA: Diagnosis not present

## 2020-01-01 DIAGNOSIS — R4701 Aphasia: Secondary | ICD-10-CM | POA: Diagnosis not present

## 2020-01-01 MED ORDER — DIVALPROEX SODIUM ER 500 MG PO TB24: 500.0000 mg | ORAL_TABLET | Freq: Every day | ORAL | 11 refills | Status: DC

## 2020-01-01 NOTE — Progress Notes (Signed)
Chief Complaint  Patient presents with  . New Patient (Initial Visit)    She is here with her son, Catalina [place], for follow up of ED visit for acute aphasia and upper extremity tremors. CT head was completed in the hospital. History of CVA in 01/2018. No further episodes since that visit.   Marland Kitchen PCP    Mar Daring, PA-C    HISTORICAL  LORETHA URE is a 85 year old female, seen in request by primary care PA Fenton Malling for evaluation of episode of acute aphasia, upper extremity tremor, initial evaluation was on January 01, 2020.  I reviewed and summarized the referring note.  Past medical history Hypertension Left frontal Intracranial bleeding  Patient suffered left frontal intracranial bleeding in December 2019, presented with difficulty sleeping, right arm weakness, for extensive rehabilitation, regained significant function, at baseline, 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 at [facility] [place], still drives.  October 22, 2019, while she was eating brunch with her friend, she suddenly developed slurred speech, there was described whole body tremor, lasting for few minutes, she was taken by her friend to emergency room, by then, she was able to make phone call to her son, she was back to her baseline  She denies similar episode in the past  I personally reviewed CT head without contrast on October 22, 2019, generalized atrophy, chronic left frontal infarction, no acute intracranial abnormality  CT angiogram of head and neck December 2019, no large vessel disease  MRI of the brain on January 13, 2018 large left frontal intraparenchymal hemorrhage, with 4 mm rightward midline shift,  Laboratory evaluation in 2021: CMP, creatinine of 0.6, CBC, hemoglobin of 12.9, INR 0.9, vitamin D 51, TSH 4.5,   REVIEW OF SYSTEMS: Full 14 system review of systems performed and notable only for as above All other review of systems were negative.  ALLERGIES: No Known Allergies  HOME  MEDICATIONS: Current Outpatient Medications  Medication Sig Dispense Refill  . acetaminophen (TYLENOL) 500 MG tablet Take 500 mg by mouth every 6 (six) hours as needed.    . Calcium Carbonate-Vit D-Min (CALCIUM 1200 PO) Take by mouth daily.     . chlorpheniramine (ALLERGY RELIEF) 4 MG tablet Take 4 mg by mouth as needed for allergies.    . cholecalciferol (VITAMIN D3) 25 MCG (1000 UT) tablet Take 1,000 Units by mouth daily.    . fluticasone (FLONASE) 50 MCG/ACT nasal spray SPRAY 2 SPRAYS INTO EACH NOSTRIL EVERY DAY 48 mL 3  . losartan (COZAAR) 25 MG tablet TAKE 1 TABLET BY MOUTH EVERY DAY 90 tablet 1  . Polyethyl Glycol-Propyl Glycol (SYSTANE) 0.4-0.3 % SOLN Apply to eye as needed.     . polyethylene glycol powder (GLYCOLAX/MIRALAX) powder Take 1 Container by mouth as needed.     Marland Kitchen Propylene Glycol (SYSTANE BALANCE) 0.6 % SOLN Apply to eye as needed.    . timolol (TIMOPTIC) 0.5 % ophthalmic solution Place 1 drop into both eyes at bedtime.  5   No current facility-administered medications for this visit.    PAST MEDICAL HISTORY: Past Medical History:  Diagnosis Date  . Acid reflux 02/26/2008  . Arthritis   . 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 Avitaminosis D 08/12/2014  . Bowel disease 02/16/2008  . Bunion 08/12/2014  . Cancer [facility])    Skin cancer- basal- upper arm , upper chest  . CN (constipation) 08/12/2014  . DD (diverticular disease) 02/16/2008  . Elevated liver enzymes 08/12/2014  . Fibroids, submucosal 08/12/2014   of  her lower lip   . Hypercholesteremia 08/12/2014  . 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 Post menopausal syndrome 08/12/2014  . Stroke [facility])     PAST SURGICAL HISTORY: Past Surgical History:  Procedure Laterality Date  . BUNIONECTOMY     05/2004, 2007  . CATARACT EXTRACTION     04/2005, 06/2005  . COLONOSCOPY W/ POLYPECTOMY    . COLONOSCOPY WITH PROPOFOL N/A 02/15/2017   Procedure: COLONOSCOPY WITH PROPOFOL;  Surgeon: Jonathon Bellows, MD;  Location: [facility] ENDOSCOPY;  Service:  Gastroenterology;  Laterality: N/A;  . HYSTEROSCOPY  2011  . ORIF WRIST FRACTURE Right 08/30/2015   Procedure: OPEN REDUCTION INTERNAL FIXATION (ORIF) RIGHT WRIST FRACTURE AND REPAIR AS NECESSARY;  Surgeon: Roseanne Kaufman, MD;  Location: [place];  Service: Orthopedics;  Laterality: Right;  . UPPER GI ENDOSCOPY    . WRIST FRACTURE SURGERY Left    06/2006    FAMILY HISTORY: Family History  Problem Relation Age of Onset  . Rheum arthritis Mother   . Lung cancer Father   . Ulcers Father   . Hodgkin's lymphoma Sister   . AVM Daughter   . Breast cancer Paternal Grandmother     SOCIAL HISTORY: Social History   Socioeconomic History  . Marital status: Divorced    Spouse name: Not on file  . Number of children: 2  . Years of education: college  . Highest education level: Master's degree (e.g., MA, MS, MEng, MEd, MSW, MBA)  Occupational History  . Occupation: retired  Tobacco Use  . Smoking status: Former Research scientist (life sciences)  . Smokeless tobacco: Never Used  . Tobacco comment: (08/29/15) quit 40 years ago  Vaping Use  . Vaping Use: Never used  Substance and Sexual Activity  . Alcohol use: Yes    Alcohol/week: 0.0 standard drinks    Comment: occasional  . Drug use: No  . Sexual activity: Not on file  Other Topics Concern  . Not on file  Social History Narrative   Lives alone.   Right-handed.   Caffeine use: 1-2 cups per day.   Social Determinants of Health   Financial Resource Strain:   . Difficulty of Paying Living Expenses: Not on file  Food Insecurity:   . Worried About Charity fundraiser in the Last Year: Not on file  . Ran Out of Food in the Last Year: Not on file  Transportation Needs:   . Lack of Transportation (Medical): Not on file  . Lack of Transportation (Non-Medical): Not on file  Physical Activity:   . Days of Exercise per Week: Not on file  . Minutes of Exercise per Session: Not on file  Stress:   . Feeling of Stress : Not on file  Social Connections:   .  Frequency of Communication with Friends and Family: Not on file  . Frequency of Social Gatherings with Friends and Family: Not on file  . Attends Religious Services: Not on file  . Active Member of Clubs or Organizations: Not on file  . Attends Archivist Meetings: Not on file  . Marital Status: Not on file  Intimate Partner Violence:   . Fear of Current or Ex-Partner: Not on file  . Emotionally Abused: Not on file  . Physically Abused: Not on file  . Sexually Abused: Not on file     PHYSICAL EXAM   Vitals:   01/01/20 1348  Weight: 127 lb (57.6 kg)  Height: 5\' 8"  (1.727 m)   Not recorded     Body mass index  is 19.31 kg/m.  PHYSICAL EXAMNIATION:  Gen: NAD, conversant, well nourised, well groomed                     Cardiovascular: Regular rate rhythm, no peripheral edema, warm, nontender. Eyes: Conjunctivae clear without exudates or hemorrhage Neck: Supple, no carotid bruits. Pulmonary: Clear to auscultation bilaterally   NEUROLOGICAL EXAM:  MENTAL STATUS: Speech:    Speech is normal; fluent and spontaneous with normal comprehension.  Cognition:     Orientation to time, place and person     Normal recent and remote memory     Normal Attention span and concentration     Normal Language, naming, repeating,spontaneous speech     Fund of knowledge   CRANIAL NERVES: CN II: Visual fields are full to confrontation. Pupils are round equal and briskly reactive to light. CN III, IV, VI: extraocular movement are normal. No ptosis. CN V: Facial sensation is intact to light touch CN VII: Face is symmetric with normal eye closure  CN VIII: Hearing is normal to causal conversation. CN IX, X: Phonation is normal. CN XI: Head turning and shoulder shrug are intact  MOTOR: There is no pronator drift of out-stretched arms. Muscle bulk and tone are normal. Muscle strength is normal.  REFLEXES: Reflexes are 2+ and symmetric at the biceps, triceps, knees, and ankles.  Plantar responses are flexor.  SENSORY: Intact to light touch, pinprick and vibratory sensation are intact in fingers and toes.  COORDINATION: There is no trunk or limb dysmetria noted.  GAIT/STANCE: Posture is normal. Gait is steady with normal steps, base, arm swing, and turning. Heel and toe walking are normal. Tandem gait is normal.  Romberg is absent.   DIAGNOSTIC DATA (LABS, IMAGING, TESTING) - I reviewed patient records, labs, notes, testing and imaging myself where available.   ASSESSMENT AND PLAN  PERNELLA ACKERLEY is a 85 y.o. female   Probable complex partial seizure History of left frontal intracranial hemorrhage  MRI of brain  EEG  Depakote ER 500mg  qhs.   No driving until episode free for 6 months     Marcial Pacas, M.D. Ph.D.  [facility] [address] [place], [place] [address] Ph: [phone] Fax: [phone]  CC:  Mar Daring, PA-C [address] [place],  [place] [address]

## 2020-01-02 ENCOUNTER — Telehealth: Payer: Self-pay | Admitting: Neurology

## 2020-01-02 NOTE — Telephone Encounter (Signed)
Mcarthur Rossetti Josem Kaufmann: [id number] (exp. 01/02/20 to 02/01/20) order sent to GI. They will reach out to the patient to schedule.

## 2020-01-02 NOTE — Telephone Encounter (Signed)
[employer] pending  

## 2020-01-08 ENCOUNTER — Other Ambulatory Visit: Payer: Medicare PPO

## 2020-01-14 ENCOUNTER — Other Ambulatory Visit: Payer: Self-pay

## 2020-01-14 ENCOUNTER — Ambulatory Visit
Admission: RE | Admit: 2020-01-14 | Discharge: 2020-01-14 | Disposition: A | Discharge status: Home / Self Care | Payer: Medicare PPO | Source: Ambulatory Visit | Attending: Neurology | Admitting: Neurology

## 2020-01-14 DIAGNOSIS — I618 Other nontraumatic intracerebral hemorrhage: Secondary | ICD-10-CM

## 2020-01-15 ENCOUNTER — Telehealth: Payer: Self-pay | Admitting: Neurology

## 2020-01-15 ENCOUNTER — Telehealth: Payer: Self-pay | Admitting: *Deleted

## 2020-01-15 NOTE — Telephone Encounter (Signed)
  IMPRESSION: This MRI of the brain without contrast shows the following: 1.   There were no acute findings. 2.   Encephalomalacia in the left frontal lobe associated with hemosiderin deposition consistent with her prior history of large left frontal intracranial hemorrhage.  This was acute on the 01/13/2018 MRI and shows expected evolution. 3.   Mild to moderate generalized cortical atrophy.  Please call patient, MRI of brain showed evidence of left frontal lobe scar, associated with hemosiderin deposition consistent with prior history of large left frontal intracranial hemorrhage, it was acute on Jan 13 2018.

## 2020-01-15 NOTE — Telephone Encounter (Signed)
-----   Message from Marcial Pacas, MD sent at 01/15/2020  8:02 AM EST ----- Please call pt for normal MRI brain.

## 2020-01-15 NOTE — Telephone Encounter (Signed)
Update from Dr. Krista Blue:  IMPRESSION: This MRI of the brain without contrast shows the following: 1. There were no acute findings. 2. Encephalomalacia in the left frontal lobe associated with hemosiderin deposition consistent with her prior history of large left frontal intracranial hemorrhage. This was acute on the 01/13/2018 MRI and shows expected evolution. 3. Mild to moderate generalized cortical atrophy.  Please call patient, MRI of brain showed evidence of left frontal lobe scar, associated with hemosiderin deposition consistent with prior history of large left frontal intracranial hemorrhage, it was acute on Jan 13 2018.

## 2020-01-15 NOTE — Telephone Encounter (Signed)
I spoke to the patient and provided her with the results below. She verbalized understanding of the findings. She will continue her medication as prescribed and keep her pending follow up.

## 2020-02-25 NOTE — Addendum Note (Signed)
Addended by: Mar Daring on: 02/25/2020 11:33 AM   Modules accepted: Level of Service

## 2020-03-24 ENCOUNTER — Encounter: Payer: Self-pay | Admitting: Physician Assistant

## 2020-03-24 ENCOUNTER — Other Ambulatory Visit: Payer: Self-pay

## 2020-03-24 ENCOUNTER — Ambulatory Visit: Payer: Medicare PPO | Admitting: Physician Assistant

## 2020-03-24 VITALS — BP 150/69 | HR 69 | Temp 98.5°F | Wt 125.0 lb

## 2020-03-24 DIAGNOSIS — Z1211 Encounter for screening for malignant neoplasm of colon: Secondary | ICD-10-CM

## 2020-03-24 DIAGNOSIS — I10 Essential (primary) hypertension: Secondary | ICD-10-CM

## 2020-03-24 DIAGNOSIS — Z86010 Personal history of colonic polyps: Secondary | ICD-10-CM

## 2020-03-24 DIAGNOSIS — I680 Cerebral amyloid angiopathy: Secondary | ICD-10-CM

## 2020-03-24 DIAGNOSIS — E854 Organ-limited amyloidosis: Secondary | ICD-10-CM | POA: Diagnosis not present

## 2020-03-24 DIAGNOSIS — I5032 Chronic diastolic (congestive) heart failure: Secondary | ICD-10-CM

## 2020-03-24 MED ORDER — LOSARTAN POTASSIUM 50 MG PO TABS: 50.0000 mg | ORAL_TABLET | Freq: Every day | ORAL | 3 refills | Status: DC

## 2020-03-24 NOTE — Patient Instructions (Signed)
Increase losartan to 50mg  daily  F/U in 4 weeks.

## 2020-03-24 NOTE — Progress Notes (Signed)
Established patient visit   Patient: Courtney Willis   DOB: 03-10-1934   86 y.o. Female  MRN: [id number] Visit Date: 03/24/2020  Today's healthcare provider: Mar Daring, PA-C   Chief Complaint  Patient presents with  . Hyperlipidemia  . Hypertension  . Osteoporosis   Subjective    HPI  Hypertension, follow-up  BP Readings from Last 3 Encounters:  03/24/20 (!) 150/69  01/01/20 (!) 158/84  10/22/19 (!) 143/110   Wt Readings from Last 3 Encounters:  03/24/20 125 lb (56.7 kg)  01/01/20 127 lb (57.6 kg)  10/22/19 135 lb (61.2 kg)     She was last seen for hypertension 6 months ago.  Management since that visit includes no changes.  She reports excellent compliance with treatment. She is not having side effects.  She is following a Regular diet. She is exercising. She does not smoke.  Use of agents associated with hypertension: none.   Outside blood pressures are not being checked. Symptoms: No chest pain No chest pressure  No palpitations No syncope  No dyspnea No orthopnea  No paroxysmal nocturnal dyspnea Yes lower extremity edema   Pertinent labs: Lab Results  Component Value Date   CHOL 192 09/24/2019   HDL 67 09/24/2019   LDLCALC 110 (H) 09/24/2019   TRIG 83 09/24/2019   CHOLHDL 2.8 09/20/2018   Lab Results  Component Value Date   NA 138 10/22/2019   K 3.9 10/22/2019   CREATININE 0.66 10/22/2019   GFRNONAA >60 10/22/2019   GFRAA >60 10/22/2019   GLUCOSE 114 (H) 10/22/2019     The ASCVD Risk score (Goff DC Jr., et al., 2013) failed to calculate for the following reasons:   The 2013 ASCVD risk score is only valid for ages 62 to 59   --------------------------------------------------------------------------------------------------- Lipid/Cholesterol, Follow-up  Last lipid panel Other pertinent labs  Lab Results  Component Value Date   CHOL 192 09/24/2019   HDL 67 09/24/2019   LDLCALC 110 (H) 09/24/2019   TRIG 83 09/24/2019   CHOLHDL  2.8 09/20/2018   Lab Results  Component Value Date   ALT 15 10/22/2019   AST 18 10/22/2019   PLT 253 10/22/2019   TSH 4.520 (H) 09/24/2019     She was last seen for this 6 months ago.  Management since that visit includes no changes, cholesterol is controlled by diet.  She reports excellent compliance with treatment. She is not having side effects.   Symptoms: No chest pain No chest pressure/discomfort  No dyspnea Yes lower extremity edema  No numbness or tingling of extremity No orthopnea  No palpitations No paroxysmal nocturnal dyspnea  No speech difficulty No syncope    The ASCVD Risk score ([place]., et al., 2013) failed to calculate for the following reasons:   The 2013 ASCVD risk score is only valid for ages 25 to 81  ---------------------------------------------------------------------------------------------------   Patient Active Problem List   Diagnosis Date Noted  . History of intracranial hemorrhage 01/01/2020  . Aphasia 01/01/2020  . Chronic venous insufficiency 07/26/2018  . Varicose veins of both lower extremities 07/26/2018  . Cerebral amyloid angiopathy ([place]) 02/13/2018  . Labile blood pressure   . SAH (subarachnoid hemorrhage) ([place]) 01/18/2018  . Intraparenchymal hemorrhage of brain ([place])   . Dyslipidemia   . Hyponatremia   . Tobacco abuse   . Hyperlipidemia   . Acute congestive heart failure with left ventricular diastolic dysfunction ([place])   . Acute blood loss anemia   .  ICH (intracerebral hemorrhage) ([place]) 01/13/2018  . Subclinical hypothyroidism 07/20/2017  . Bunion 08/12/2014  . Cataract 08/12/2014  . CN (constipation) 08/12/2014  . Edema of foot 08/12/2014  . Fibroids, submucosal 08/12/2014  . Glaucoma 08/12/2014  . Coitalgia 08/12/2014  . 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 Avitaminosis D 08/12/2014  . Post menopausal syndrome 08/12/2014  . Alcohol use 08/06/2014  . Acid reflux 02/26/2008  . DD  (diverticular disease) 02/16/2008  . Bowel disease 02/16/2008   Past Medical History:  Diagnosis Date  . Acid reflux 02/26/2008  . Arthritis   . 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 Avitaminosis D 08/12/2014  . Bowel disease 02/16/2008  . Bunion 08/12/2014  . Cancer [facility])    Skin cancer- basal- upper arm , upper chest  . CN (constipation) 08/12/2014  . DD (diverticular disease) 02/16/2008  . Elevated liver enzymes 08/12/2014  . Fibroids, submucosal 08/12/2014   of her lower lip   . Hypercholesteremia 08/12/2014  . 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 Post menopausal syndrome 08/12/2014  . Stroke [facility])    Social History   Tobacco Use  . Smoking status: Former Research scientist (life sciences)  . Smokeless tobacco: Never Used  . Tobacco comment: (08/29/15) quit 40 years ago  Vaping Use  . Vaping Use: Never used  Substance Use Topics  . Alcohol use: Yes    Alcohol/week: 0.0 standard drinks    Comment: occasional  . Drug use: No   No Known Allergies   Medications: Outpatient Medications Prior to Visit  Medication Sig  . acetaminophen (TYLENOL) 500 MG tablet Take 500 mg by mouth every 6 (six) hours as needed.  . Calcium Carbonate-Vit D-Min (CALCIUM 1200 PO) Take by mouth daily.   . chlorpheniramine (CHLOR-TRIMETON) 4 MG tablet Take 4 mg by mouth as needed for allergies.  . cholecalciferol (VITAMIN D3) 25 MCG (1000 UT) tablet Take 1,000 Units by mouth daily.  Marland Kitchen denosumab (PROLIA) 60 MG/ML SOSY injection Inject into the skin.  Marland Kitchen divalproex (DEPAKOTE ER) 500 MG 24 hr tablet Take 1 tablet (500 mg total) by mouth at bedtime.  . fluticasone (FLONASE) 50 MCG/ACT nasal spray SPRAY 2 SPRAYS INTO EACH NOSTRIL EVERY DAY  . losartan (COZAAR) 25 MG tablet TAKE 1 TABLET BY MOUTH EVERY DAY  . polyethylene glycol powder (GLYCOLAX/MIRALAX) powder Take 1 Container by mouth as needed.   Marland Kitchen Propylene Glycol (SYSTANE BALANCE) 0.6 % SOLN Apply to eye as needed.  . timolol (TIMOPTIC) 0.5 % ophthalmic solution Place 1 drop into both eyes at  bedtime.  Vladimir Faster Glycol-Propyl Glycol (SYSTANE) 0.4-0.3 % SOLN Apply to eye as needed.    No facility-administered medications prior to visit.    Review of Systems  Constitutional: Negative.   Respiratory: Negative.   Cardiovascular: Negative.   Gastrointestinal: Negative.   Neurological: Negative.         Objective    BP (!) 150/69 (BP Location: Right Arm, Patient Position: Sitting, Cuff Size: Large)   Pulse 69   Temp 98.5 F (36.9 C) (Oral)   Wt 125 lb (56.7 kg)   SpO2 100%   BMI 19.01 kg/m     Physical Exam Vitals reviewed.  Constitutional:      General: She is not in acute distress.    Appearance: Normal appearance. She is well-developed and well-nourished. She is not ill-appearing or diaphoretic.  Cardiovascular:     Rate and Rhythm: Normal rate and regular rhythm.     Heart sounds: Normal heart sounds. No  murmur heard. No friction rub. No gallop.   Pulmonary:     Effort: Pulmonary effort is normal. No respiratory distress.     Breath sounds: Normal breath sounds. No wheezing or rales.  Musculoskeletal:     Cervical back: Normal range of motion and neck supple.  Neurological:     Mental Status: She is alert.       No results found for any visits on 03/24/20.  Assessment & Plan     1. History of colon polyps Patient has h/o pre-cancerous polyps and last colonoscopy was to have repeated in 3 years. Referral placed.  - Ambulatory referral to Gastroenterology  2. Colon cancer screening See above medical treatment plan. - Ambulatory referral to Gastroenterology  3. Chronic diastolic heart failure (HCC) Stable.   4. Cerebral amyloid angiopathy (HCC) Stable.   5. Primary hypertension Elevated BP today. Will increase losartan to 50mg  from 25mg . F/U in 4 weeks.  - losartan (COZAAR) 50 MG tablet; Take 1 tablet (50 mg total) by mouth daily.  Dispense: 90 tablet; Refill: 3   No follow-ups on file.      Reynolds Bowl, PA-C, have reviewed  all documentation for this visit. The documentation on 03/24/20 for the exam, diagnosis, procedures, and orders are all accurate and complete.   Rubye Beach  [facility] [phone] (phone) [phone] (fax)  [place]

## 2020-04-05 ENCOUNTER — Other Ambulatory Visit: Payer: Self-pay | Admitting: Physician Assistant

## 2020-04-05 DIAGNOSIS — I10 Essential (primary) hypertension: Secondary | ICD-10-CM

## 2020-04-21 ENCOUNTER — Ambulatory Visit: Payer: Medicare PPO | Admitting: Neurology

## 2020-04-21 ENCOUNTER — Encounter: Payer: Self-pay | Admitting: Neurology

## 2020-04-21 ENCOUNTER — Other Ambulatory Visit: Payer: Self-pay

## 2020-04-21 VITALS — BP 124/73 | HR 93 | Ht 68.0 in | Wt 126.0 lb

## 2020-04-21 DIAGNOSIS — Z8679 Personal history of other diseases of the circulatory system: Secondary | ICD-10-CM | POA: Diagnosis not present

## 2020-04-21 DIAGNOSIS — G40209 Localization-related (focal) (partial) symptomatic epilepsy and epileptic syndromes with complex partial seizures, not intractable, without status epilepticus: Secondary | ICD-10-CM | POA: Diagnosis not present

## 2020-04-21 DIAGNOSIS — R4701 Aphasia: Secondary | ICD-10-CM | POA: Diagnosis not present

## 2020-04-21 MED ORDER — DIVALPROEX SODIUM ER 500 MG PO TB24: 500.0000 mg | ORAL_TABLET | Freq: Every day | ORAL | 3 refills | Status: DC

## 2020-04-21 NOTE — Progress Notes (Signed)
Chief Complaint  Patient presents with  . Follow-up    Patient is here in [place] with her son Catalina Antigua for follow-up of her aphasia and MRI results. Patient denies any complaints since last visit.    ASSESSMENT AND PLAN  Courtney Willis is a 86 y.o. female   Probable complex partial seizure History of left frontal intracranial hemorrhage  MRI of brain in December 2021 showed no acute abnormality, encephalomalacia left frontal lobe associated with  hemosiderin deposition, consistent with previous history of large left frontal intraparenchymal hemorrhage  EEG  Keep Depakote ER 500mg  qhs.   No driving until episode free for 6 months   DIAGNOSTIC DATA (LABS, IMAGING, TESTING) - I reviewed patient records, labs, notes, testing and imaging myself where available.  MRI of brain on Jan 14 2020:  Encephalomalacia in the left lobe with associated hemosiderin deposition consistent with prior history of left frontal intracranial hemorrhage, which was acute on Jan 13 2018. Moderate atrophy.    PHYSICAL EXAM   Vitals:   04/21/20 1518  BP: 124/73  Pulse: 93  Weight: 126 lb (57.2 kg)  Height: 5\' 8"  (1.727 m)   Not recorded     Body mass index is 19.16 kg/m.  PHYSICAL EXAMNIATION:  Gen: NAD, conversant, well nourised, well groomed           NEUROLOGICAL EXAM:  MENTAL STATUS: Speech/cognition: Awake, alert oriented to history taking and casual conversation   CRANIAL NERVES: CN II: Visual fields are full to confrontation. Pupils are round equal and briskly reactive to light. CN III, IV, VI: extraocular movement are normal. No ptosis. CN V: Facial sensation is intact to light touch CN VII: Face is symmetric with normal eye closure  CN VIII: Hearing is normal to causal conversation. CN IX, X: Phonation is normal. CN XI: Head turning and shoulder shrug are intact  MOTOR: There is no pronator drift of out-stretched arms. Muscle bulk and tone are normal. Muscle strength is  normal.  REFLEXES: Reflexes are 2+ and symmetric at the biceps, triceps, knees, and ankles. Plantar responses are flexor.  SENSORY: Intact to light touch, pinprick and vibratory sensation are intact in fingers and toes.  COORDINATION: There is no trunk or limb dysmetria noted.  GAIT/STANCE: She can get up from seated position arm crossed, steady   HISTORICAL  Courtney Willis is a 86 year old female, seen in request by primary care PA Fenton Malling for evaluation of episode of acute aphasia, upper extremity tremor, initial evaluation was on January 01, 2020.  I reviewed and summarized the referring note.  Past medical history Hypertension Left frontal Intracranial bleeding  Patient suffered left frontal intracranial bleeding in December 2019, presented with difficulty speaking, right arm weakness, for extensive rehabilitation, regained significant function, at baseline, 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 at [facility], still drives.  October 22, 2019, while she was eating brunch with her friend, she suddenly developed slurred speech, there was described whole body tremor, lasting for few minutes, she was taken by her friend to emergency room, by then, she was able to make phone call to her son, she was back to her baseline  She denies similar episode in the past  I personally reviewed CT head without contrast on October 22, 2019, generalized atrophy, chronic left frontal infarction, no acute intracranial abnormality  CT angiogram of head and neck December 2019, no large vessel disease  MRI of the brain on January 13, 2018 large left frontal intraparenchymal hemorrhage, with  4 mm rightward midline shift,  Laboratory evaluation in 2021: CMP, creatinine of 0.6, CBC, hemoglobin of 12.9, INR 0.9, vitamin D 51, TSH 4.5,  Update April 21, 2020: She is accompanied by her son at today's clinical visit, has no recurrent confusion, passing out spells, tolerating Depakote ER 500 mg every  night, exercise regularly without difficulty  Personally reviewed MRI of the brain January 14, 2020: No acute abnormality, encephalomalacia in the left frontal lobe associated with hemosiderin deposition consistent with her previous history of large left frontal intracranial hemorrhage, which was acute in December 2019, multiple morbid generalized atrophy  REVIEW OF SYSTEMS: Full 14 system review of systems performed and notable only for as above All other review of systems were negative.  ALLERGIES: No Known Allergies  HOME MEDICATIONS: Current Outpatient Medications  Medication Sig Dispense Refill  . acetaminophen (TYLENOL) 500 MG tablet Take 500 mg by mouth every 6 (six) hours as needed.    . Calcium Carbonate-Vit D-Min (CALCIUM 1200 PO) Take by mouth daily.     . chlorpheniramine (CHLOR-TRIMETON) 4 MG tablet Take 4 mg by mouth as needed for allergies.    . cholecalciferol (VITAMIN D3) 25 MCG (1000 UT) tablet Take 1,000 Units by mouth daily.    Marland Kitchen denosumab (PROLIA) 60 MG/ML SOSY injection Inject into the skin.    Marland Kitchen divalproex (DEPAKOTE ER) 500 MG 24 hr tablet Take 1 tablet (500 mg total) by mouth at bedtime. 30 tablet 11  . fluticasone (FLONASE) 50 MCG/ACT nasal spray SPRAY 2 SPRAYS INTO EACH NOSTRIL EVERY DAY 48 mL 3  . losartan (COZAAR) 50 MG tablet Take 1 tablet (50 mg total) by mouth daily. 90 tablet 3  . polyethylene glycol powder (GLYCOLAX/MIRALAX) powder Take 1 Container by mouth as needed.     Marland Kitchen Propylene Glycol (SYSTANE BALANCE) 0.6 % SOLN Apply to eye as needed.    . sodium chloride (OCEAN) 0.65 % SOLN nasal spray Place 1 spray into both nostrils as needed for congestion.    . timolol (TIMOPTIC) 0.5 % ophthalmic solution Place 1 drop into both eyes at bedtime.  5   No current facility-administered medications for this visit.    PAST MEDICAL HISTORY: Past Medical History:  Diagnosis Date  . Acid reflux 02/26/2008  . Arthritis   . 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  Avitaminosis D 08/12/2014  . Bowel disease 02/16/2008  . Bunion 08/12/2014  . Cancer [facility])    Skin cancer- basal- upper arm , upper chest  . CN (constipation) 08/12/2014  . DD (diverticular disease) 02/16/2008  . Elevated liver enzymes 08/12/2014  . Fibroids, submucosal 08/12/2014   of her lower lip   . Hypercholesteremia 08/12/2014  . 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 Post menopausal syndrome 08/12/2014  . Stroke [facility])     PAST SURGICAL HISTORY: Past Surgical History:  Procedure Laterality Date  . BUNIONECTOMY     05/2004, 2007  . CATARACT EXTRACTION     04/2005, 06/2005  . COLONOSCOPY W/ POLYPECTOMY    . COLONOSCOPY WITH PROPOFOL N/A 02/15/2017   Procedure: COLONOSCOPY WITH PROPOFOL;  Surgeon: Jonathon Bellows, MD;  Location: [facility] ENDOSCOPY;  Service: Gastroenterology;  Laterality: N/A;  . HYSTEROSCOPY  2011  . ORIF WRIST FRACTURE Right 08/30/2015   Procedure: OPEN REDUCTION INTERNAL FIXATION (ORIF) RIGHT WRIST FRACTURE AND REPAIR AS NECESSARY;  Surgeon: Roseanne Kaufman, MD;  Location: [facility];  Service: Orthopedics;  Laterality: Right;  . UPPER GI ENDOSCOPY    . WRIST FRACTURE SURGERY Left  06/2006    FAMILY HISTORY: Family History  Problem Relation Age of Onset  . Rheum arthritis Mother   . Lung cancer Father   . Ulcers Father   . Hodgkin's lymphoma Sister   . AVM Daughter   . Breast cancer Paternal Grandmother     SOCIAL HISTORY: Social History   Socioeconomic History  . Marital status: Divorced    Spouse name: Not on file  . Number of children: 2  . Years of education: college  . Highest education level: Master's degree (e.g., MA, MS, MEng, MEd, MSW, MBA)  Occupational History  . Occupation: retired  Tobacco Use  . Smoking status: Former Research scientist (life sciences)  . Smokeless tobacco: Never Used  . Tobacco comment: (08/29/15) quit 40 years ago  Vaping Use  . Vaping Use: Never used  Substance and Sexual Activity  . Alcohol use: Yes    Alcohol/week: 0.0 standard drinks    Comment: occasional   . Drug use: No  . Sexual activity: Not on file  Other Topics Concern  . Not on file  Social History Narrative   Lives alone.   Right-handed.   Caffeine use: 1 cup in the morning and sometimes a decaf cup in the afternoon   Social Determinants of Health   Financial Resource Strain: Not on file  Food Insecurity: Not on file  Transportation Needs: Not on file  Physical Activity: Not on file  Stress: Not on file  Social Connections: Not on file  Intimate Partner Violence: Not on file    Marcial Pacas, M.D. Ph.D.  [facility] [address] [place], [place] [address] Ph: [phone] Fax: [phone]  CC:  Mar Daring, PA-C [place],  [place] [id number]

## 2020-04-22 LAB — CBC WITH DIFFERENTIAL/PLATELET
Basophils Absolute: 0 10*3/uL (ref 0.0–0.2)
Basos: 1 %
EOS (ABSOLUTE): 0 10*3/uL (ref 0.0–0.4)
Eos: 0 %
Hematocrit: 34.8 % (ref 34.0–46.6)
Hemoglobin: 12 g/dL (ref 11.1–15.9)
Immature Grans (Abs): 0 10*3/uL (ref 0.0–0.1)
Immature Granulocytes: 0 %
Lymphocytes Absolute: 1.3 10*3/uL (ref 0.7–3.1)
Lymphs: 20 %
MCH: 31.4 pg (ref 26.6–33.0)
MCHC: 34.5 g/dL (ref 31.5–35.7)
MCV: 91 fL (ref 79–97)
Monocytes Absolute: 0.5 10*3/uL (ref 0.1–0.9)
Monocytes: 8 %
Neutrophils Absolute: 4.7 10*3/uL (ref 1.4–7.0)
Neutrophils: 71 %
Platelets: 271 10*3/uL (ref 150–450)
RBC: 3.82 x10E6/uL (ref 3.77–5.28)
RDW: 11.8 % (ref 11.7–15.4)
WBC: 6.6 10*3/uL (ref 3.4–10.8)

## 2020-04-22 LAB — COMPREHENSIVE METABOLIC PANEL
ALT: 24 IU/L (ref 0–32)
AST: 23 IU/L (ref 0–40)
Albumin/Globulin Ratio: 2.1 (ref 1.2–2.2)
Albumin: 4.2 g/dL (ref 3.6–4.6)
Alkaline Phosphatase: 47 IU/L (ref 44–121)
BUN/Creatinine Ratio: 28 (ref 12–28)
BUN: 25 mg/dL (ref 8–27)
Bilirubin Total: 0.5 mg/dL (ref 0.0–1.2)
CO2: 22 mmol/L (ref 20–29)
Calcium: 9.5 mg/dL (ref 8.7–10.3)
Chloride: 97 mmol/L (ref 96–106)
Creatinine, Ser: 0.89 mg/dL (ref 0.57–1.00)
Globulin, Total: 2 g/dL (ref 1.5–4.5)
Glucose: 103 mg/dL — ABNORMAL HIGH (ref 65–99)
Potassium: 4.8 mmol/L (ref 3.5–5.2)
Sodium: 136 mmol/L (ref 134–144)
Total Protein: 6.2 g/dL (ref 6.0–8.5)
eGFR: 64 mL/min/{1.73_m2} (ref >59)

## 2020-04-22 LAB — VALPROIC ACID LEVEL: Valproic Acid Lvl: 45 ug/mL — ABNORMAL LOW (ref 50–100)

## 2020-04-22 LAB — TSH: TSH: 3.33 u[IU]/mL (ref 0.450–4.500)

## 2020-04-25 ENCOUNTER — Ambulatory Visit (INDEPENDENT_AMBULATORY_CARE_PROVIDER_SITE_OTHER): Payer: Medicare PPO | Admitting: Physician Assistant

## 2020-04-25 ENCOUNTER — Encounter: Payer: Self-pay | Admitting: Physician Assistant

## 2020-04-25 ENCOUNTER — Other Ambulatory Visit: Payer: Self-pay

## 2020-04-25 VITALS — BP 129/80 | HR 69 | Temp 98.4°F | Wt 126.0 lb

## 2020-04-25 DIAGNOSIS — I10 Essential (primary) hypertension: Secondary | ICD-10-CM | POA: Diagnosis not present

## 2020-04-25 NOTE — Progress Notes (Signed)
Established patient visit   Patient: Courtney Willis   DOB: [birth year]   86 y.o. Female  MRN: [id number] Visit Date: 04/25/2020  Today's healthcare provider: Mar Daring, PA-C   Chief Complaint  Patient presents with  . Hypertension   Subjective    HPI  Hypertension, follow-up  BP Readings from Last 3 Encounters:  04/21/20 124/73  03/24/20 (!) 150/69  01/01/20 (!) 158/84   Wt Readings from Last 3 Encounters:  04/21/20 126 lb (57.2 kg)  03/24/20 125 lb (56.7 kg)  01/01/20 127 lb (57.6 kg)     She was last seen for hypertension 1 month ago.  Management since that visit includes increasing losartan to 50mg  a day.  She reports excellent compliance with treatment. She is not having side effects.  She is following a Low Sodium diet. She is exercising. She does not smoke.  Use of agents associated with hypertension: none.   Outside blood pressures are not being checked at home. Symptoms: No chest pain No chest pressure  No palpitations No syncope  No dyspnea No orthopnea  No paroxysmal nocturnal dyspnea No lower extremity edema   Pertinent labs: Lab Results  Component Value Date   CHOL 192 09/24/2019   HDL 67 09/24/2019   LDLCALC 110 (H) 09/24/2019   TRIG 83 09/24/2019   CHOLHDL 2.8 09/20/2018   Lab Results  Component Value Date   NA 136 04/21/2020   K 4.8 04/21/2020   CREATININE 0.89 04/21/2020   GFRNONAA >60 10/22/2019   GFRAA >60 10/22/2019   GLUCOSE 103 (H) 04/21/2020     The ASCVD Risk score (Goff DC Jr., et al., 2013) failed to calculate for the following reasons:   The 2013 ASCVD risk score is only valid for ages 59 to 72   ---------------------------------------------------------------------------------------------------   Patient Active Problem List   Diagnosis Date Noted  . Partial symptomatic epilepsy with complex partial seizures, not intractable, without status epilepticus ([place]) 04/21/2020  . History of intracranial hemorrhage  01/01/2020  . Aphasia 01/01/2020  . Chronic venous insufficiency 07/26/2018  . Varicose veins of both lower extremities 07/26/2018  . Cerebral amyloid angiopathy ([place]) 02/13/2018  . Labile blood pressure   . SAH (subarachnoid hemorrhage) ([place]) 01/18/2018  . Intraparenchymal hemorrhage of brain ([place])   . Dyslipidemia   . Hyponatremia   . Tobacco abuse   . Hyperlipidemia   . Acute congestive heart failure with left ventricular diastolic dysfunction ([place])   . Acute blood loss anemia   . ICH (intracerebral hemorrhage) ([place]) 01/13/2018  . Subclinical hypothyroidism 07/20/2017  . Bunion 08/12/2014  . Cataract 08/12/2014  . CN (constipation) 08/12/2014  . Edema of foot 08/12/2014  . Fibroids, submucosal 08/12/2014  . Glaucoma 08/12/2014  . Coitalgia 08/12/2014  . 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 Avitaminosis D 08/12/2014  . Post menopausal syndrome 08/12/2014  . Alcohol use 08/06/2014  . Acid reflux 02/26/2008  . DD (diverticular disease) 02/16/2008  . Bowel disease 02/16/2008   Past Medical History:  Diagnosis Date  . Acid reflux 02/26/2008  . Arthritis   . 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 Avitaminosis D 08/12/2014  . Bowel disease 02/16/2008  . Bunion 08/12/2014  . Cancer [facility])    Skin cancer- basal- upper arm , upper chest  . CN (constipation) 08/12/2014  . DD (diverticular disease) 02/16/2008  . Elevated liver enzymes 08/12/2014  . Fibroids, submucosal 08/12/2014   of her lower lip   .  Hypercholesteremia 08/12/2014  . 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 Post menopausal syndrome 08/12/2014  . Stroke [facility])    Social History   Tobacco Use  . Smoking status: Former Research scientist (life sciences)  . Smokeless tobacco: Never Used  . Tobacco comment: (08/29/15) quit 40 years ago  Vaping Use  . Vaping Use: Never used  Substance Use Topics  . Alcohol use: Yes    Alcohol/week: 0.0 standard drinks    Comment: occasional  . Drug use: No   No Known Allergies   Medications: Outpatient  Medications Prior to Visit  Medication Sig  . acetaminophen (TYLENOL) 500 MG tablet Take 500 mg by mouth every 6 (six) hours as needed.  . Calcium Carbonate-Vit D-Min (CALCIUM 1200 PO) Take by mouth daily.   . chlorpheniramine (CHLOR-TRIMETON) 4 MG tablet Take 4 mg by mouth as needed for allergies.  . cholecalciferol (VITAMIN D3) 25 MCG (1000 UT) tablet Take 1,000 Units by mouth daily.  Marland Kitchen denosumab (PROLIA) 60 MG/ML SOSY injection Inject into the skin.  Marland Kitchen divalproex (DEPAKOTE ER) 500 MG 24 hr tablet Take 1 tablet (500 mg total) by mouth at bedtime.  . fluticasone (FLONASE) 50 MCG/ACT nasal spray SPRAY 2 SPRAYS INTO EACH NOSTRIL EVERY DAY  . losartan (COZAAR) 50 MG tablet Take 1 tablet (50 mg total) by mouth daily.  . polyethylene glycol powder (GLYCOLAX/MIRALAX) powder Take 1 Container by mouth as needed.   Marland Kitchen Propylene Glycol (SYSTANE BALANCE) 0.6 % SOLN Apply to eye as needed.  . sodium chloride (OCEAN) 0.65 % SOLN nasal spray Place 1 spray into both nostrils as needed for congestion.  . timolol (TIMOPTIC) 0.5 % ophthalmic solution Place 1 drop into both eyes at bedtime.   No facility-administered medications prior to visit.    Review of Systems  Constitutional: Negative.   Respiratory: Negative.   Cardiovascular: Positive for leg swelling (Both ankles.). Negative for chest pain and palpitations.  Gastrointestinal: Negative.   Neurological: Negative for dizziness, light-headedness and headaches.        Objective    There were no vitals taken for this visit.   Physical Exam Vitals reviewed.  Constitutional:      General: She is not in acute distress.    Appearance: Normal appearance. She is well-developed and normal weight. She is not ill-appearing or diaphoretic.  Cardiovascular:     Rate and Rhythm: Normal rate and regular rhythm.     Pulses: Normal pulses.     Heart sounds: Normal heart sounds. No murmur heard. No friction rub. No gallop.   Pulmonary:     Effort:  Pulmonary effort is normal. No respiratory distress.     Breath sounds: Normal breath sounds. No wheezing or rales.  Musculoskeletal:     Cervical back: Normal range of motion and neck supple.  Neurological:     Mental Status: She is alert.  Psychiatric:        Mood and Affect: Mood normal.        Thought Content: Thought content normal.       No results found for any visits on 04/25/20.  Assessment & Plan     1. Primary hypertension Much improved today. Continue Losartan 50mg . F/U with Dr. Brita Romp in 3-4 months to establish care.    No follow-ups on file.      Reynolds Bowl, PA-C, have reviewed all documentation for this visit. The documentation on 04/25/20 for the exam, diagnosis, procedures, and orders are all accurate and complete.   Mar Daring,  PA-C  [facility] [phone] (phone) [phone] (fax)  [place]

## 2020-05-08 ENCOUNTER — Ambulatory Visit: Payer: Medicare PPO | Admitting: Gastroenterology

## 2020-05-08 ENCOUNTER — Other Ambulatory Visit: Payer: Self-pay

## 2020-05-08 ENCOUNTER — Encounter: Payer: Self-pay | Admitting: Gastroenterology

## 2020-05-08 VITALS — BP 143/72 | HR 80 | Ht 68.0 in | Wt 124.8 lb

## 2020-05-08 DIAGNOSIS — Z86010 Personal history of colonic polyps: Secondary | ICD-10-CM

## 2020-05-08 NOTE — Progress Notes (Signed)
Jonathon Bellows MD, MRCP(U.K) [address]  [place]  [place], [place] [address]  Main: [phone]  Fax: [phone]   Gastroenterology Consultation  Referring Provider:     Florian Buff* Primary Care Physician:  Virginia Crews, MD Primary Gastroenterologist:  Dr. Jonathon Bellows  Reason for Consultation:     Discuss appropriateness of surveillance colonoscopy         HPI:   Courtney Willis is a 86 y.o. y/o female referred for consultation & management  by Dr. Virginia Crews, MD.    02/15/2017 : Colonoscopy :Two 3 to 4 mm polyps in the descending colon and in the cecum, removed with a cold biopsy forceps. 3 polyps ,  5 to 7 mm polyps in the ascending colon, removed with a cold snare.- One 15 mm polyp in the cecum, removed with mucosal resection.  5 polyps were adenomas      Past Medical History:  Diagnosis Date  . Acid reflux 02/26/2008  . Arthritis   . 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 Avitaminosis D 08/12/2014  . Bowel disease 02/16/2008  . Bunion 08/12/2014  . Cancer [facility])    Skin cancer- basal- upper arm , upper chest  . CN (constipation) 08/12/2014  . DD (diverticular disease) 02/16/2008  . Elevated liver enzymes 08/12/2014  . Fibroids, submucosal 08/12/2014   of her lower lip   . Hypercholesteremia 08/12/2014  . 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 Post menopausal syndrome 08/12/2014  . Stroke [facility])     Past Surgical History:  Procedure Laterality Date  . BUNIONECTOMY     05/2004, 2007  . CATARACT EXTRACTION     04/2005, 06/2005  . COLONOSCOPY W/ POLYPECTOMY    . COLONOSCOPY WITH PROPOFOL N/A 02/15/2017   Procedure: COLONOSCOPY WITH PROPOFOL;  Surgeon: Jonathon Bellows, MD;  [facility]: [facility] ENDOSCOPY;  Service: Gastroenterology;  Laterality: N/A;  . HYSTEROSCOPY  2011  . ORIF WRIST FRACTURE Right 08/30/2015   Procedure: OPEN REDUCTION INTERNAL FIXATION (ORIF) RIGHT WRIST FRACTURE AND REPAIR AS NECESSARY;  Surgeon: Roseanne Kaufman, MD;  Location: [facility];  Service:  Orthopedics;  Laterality: Right;  . UPPER GI ENDOSCOPY    . WRIST FRACTURE SURGERY Left    06/2006    Prior to Admission medications   Medication Sig Start Date End Date Taking? Authorizing Provider  acetaminophen (TYLENOL) 500 MG tablet Take 500 mg by mouth every 6 (six) hours as needed.    [provider]  Calcium Carbonate-Vit D-Min (CALCIUM 1200 PO) Take by mouth daily.     [provider]  chlorpheniramine (CHLOR-TRIMETON) 4 MG tablet Take 4 mg by mouth as needed for allergies.    [provider]  cholecalciferol (VITAMIN D3) 25 MCG (1000 UT) tablet Take 1,000 Units by mouth daily.    [provider]  denosumab (PROLIA) 60 MG/ML SOSY injection Inject into the skin. 03/21/20   [provider]  divalproex (DEPAKOTE ER) 500 MG 24 hr tablet Take 1 tablet (500 mg total) by mouth at bedtime. 04/21/20   Marcial Pacas, MD  fluticasone [facility]) 50 MCG/ACT nasal spray SPRAY 2 SPRAYS INTO EACH NOSTRIL EVERY DAY 12/09/19   Mar Daring, PA-C  losartan (COZAAR) 50 MG tablet Take 1 tablet (50 mg total) by mouth daily. 03/24/20   Mar Daring, PA-C  polyethylene glycol powder (GLYCOLAX/MIRALAX) powder Take 1 Container by mouth as needed.     [provider]  Propylene Glycol (SYSTANE BALANCE) 0.6 % SOLN Apply to  eye as needed.    [provider]  sodium chloride (OCEAN) 0.65 % SOLN nasal spray Place 1 spray into both nostrils as needed for congestion.    [provider]  timolol (TIMOPTIC) 0.5 % ophthalmic solution Place 1 drop into both eyes at bedtime. 07/05/14   [provider]    Family History  Problem Relation Age of Onset  . Rheum arthritis Mother   . Lung cancer Father   . Ulcers Father   . Hodgkin's lymphoma Sister   . AVM Daughter   . Breast cancer Paternal Grandmother      Social History   Tobacco Use  . Smoking status: Former Research scientist (life sciences)  . Smokeless tobacco: Never Used  . Tobacco comment:  (08/29/15) quit 40 years ago  Vaping Use  . Vaping Use: Never used  Substance Use Topics  . Alcohol use: Yes    Alcohol/week: 0.0 standard drinks    Comment: occasional  . Drug use: No    Allergies as of 05/08/2020  . (No Known Allergies)    Review of Systems:    All systems reviewed and negative except where noted in HPI.   Physical Exam:  There were no vitals taken for this visit. No LMP recorded. Patient is postmenopausal. Psych:  Alert and cooperative. Normal mood and affect. General:   Alert,  Well-developed, well-nourished, pleasant and cooperative in NAD Neurologic:  Alert and oriented x3;  grossly normal neurologically. Psych:  Alert and cooperative. Normal mood and affect.  Imaging Studies: No results found.  Assessment and Plan:   Courtney Willis is a 86 y.o. y/o female has been referred for colon cancer screening with a personal history of colon polyps.  5 tubular adenomas were present in 2019.  Had a long discussion explaining the risks of the procedure vs  the benefits I did explain that as 1 gets older the risks gradually exceed the benefits and its an individualized decision based on patient's wishes and my explanation of the risks and benefits.  I did explain that if there were to be a complication which is usually higher as well and gets older, outcomes are often different as one is older.  She did have a stroke she recollects in December.  Not on any blood thinners.  Explained that she seems to be in very good health otherwise and physically appears to be younger than her numerical age.  I did explain that I would not have any objection to performing a colonoscopy but she should be aware of the risks versus the relative benefits.  I explained that when she does make up her mind about her decision to please let our office know and I will be happy to go ahead either way  Follow up in as needed  Dr Jonathon Bellows MD,MRCP(U.K)

## 2020-05-14 ENCOUNTER — Ambulatory Visit: Payer: Medicare PPO | Admitting: Neurology

## 2020-05-14 DIAGNOSIS — R4701 Aphasia: Secondary | ICD-10-CM

## 2020-05-14 DIAGNOSIS — G40209 Localization-related (focal) (partial) symptomatic epilepsy and epileptic syndromes with complex partial seizures, not intractable, without status epilepticus: Secondary | ICD-10-CM | POA: Diagnosis not present

## 2020-05-14 DIAGNOSIS — Z8679 Personal history of other diseases of the circulatory system: Secondary | ICD-10-CM

## 2020-05-20 NOTE — Procedures (Signed)
   HISTORY: 86 year old female, with history of left frontal encephalomalacia, presented with episode of passing out spells TECHNIQUE:  This is a routine 16 channel EEG recording with one channel devoted to a limited EKG recording.  It was performed during wakefulness, drowsiness and asleep.  Hyperventilation and photic stimulation were performed as activating procedures.  There are minimum muscle and movement artifact noted.  Upon maximum arousal, posterior dominant waking rhythm consistent of rhythmic alpha range activity, with frequency of 9 hz. Activities are symmetric over the bilateral posterior derivations and attenuated with eye opening.  Hyperventilation produced mild/moderate buildup with higher amplitude and the slower activities noted.  Photic stimulation did not alter the tracing.  During EEG recording, patient developed drowsiness and no deeper stage of sleep was achieved  During EEG recording, there was no epileptiform discharge noted.  EKG demonstrate sinus rhythm, with heart rate of 64 bpm  CONCLUSION: This is a  normal awake EEG.  There is no electrodiagnostic evidence of epileptiform discharge.  Marcial Pacas, M.D. Ph.D.  [facility] [place], [place] [address] Phone: [phone] Fax:      [phone]

## 2020-08-26 ENCOUNTER — Ambulatory Visit: Payer: Medicare PPO | Admitting: Family Medicine

## 2020-08-28 ENCOUNTER — Encounter: Payer: Self-pay | Admitting: Family Medicine

## 2020-08-28 ENCOUNTER — Other Ambulatory Visit: Payer: Self-pay

## 2020-08-28 ENCOUNTER — Ambulatory Visit: Payer: Medicare PPO | Admitting: Family Medicine

## 2020-08-28 VITALS — BP 126/78 | HR 81 | Temp 98.1°F | Wt 121.0 lb

## 2020-08-28 DIAGNOSIS — I5032 Chronic diastolic (congestive) heart failure: Secondary | ICD-10-CM | POA: Diagnosis not present

## 2020-08-28 DIAGNOSIS — E038 Other specified hypothyroidism: Secondary | ICD-10-CM

## 2020-08-28 DIAGNOSIS — I10 Essential (primary) hypertension: Secondary | ICD-10-CM

## 2020-08-28 NOTE — Progress Notes (Signed)
Established patient visit   Patient: Courtney Willis   DOB: 07/15/1934   86 y.o. Female  MRN: [id number] Visit Date: 08/28/2020  Today's healthcare provider: Lavon Paganini, MD   Chief Complaint  Patient presents with   Hypertension   Subjective    Hypertension Pertinent negatives include no chest pain, headaches, neck pain, palpitations or shortness of breath.   Hypertension, follow-up  BP Readings from Last 3 Encounters:  08/28/20 126/78  05/08/20 (!) 143/72  04/25/20 129/80   Wt Readings from Last 3 Encounters:  08/28/20 121 lb (54.9 kg)  05/08/20 124 lb 12.8 oz (56.6 kg)  04/25/20 126 lb (57.2 kg)     She was last seen for hypertension 4 months ago.  BP at that visit was as above. Management since that visit includes increase in Losartan from 25 mg to 50 mg daily.  She reports good compliance with treatment. She is having side effects.  She is following a Regular diet. She is not exercising. She does not smoke.  Use of agents associated with hypertension: none.   Outside blood pressures are not being checked. Symptoms: No chest pain No chest pressure  No palpitations No syncope  No dyspnea No orthopnea  No paroxysmal nocturnal dyspnea No lower extremity edema   Neurology She regularly see neurology for her aphasia and history of intracranial hemorrhage. Since she had a stroke her short-term memory has been affected. This has also affected her activity level.   Osteoporosis  She sees endocrinology for her osteoporosis. She reports when he bone density was check her scores were low. She reports that [facility] denied the request for [place]. She was recommended to take Prolia. She is currently on 60 mg Prolia and has not concerns or adverse effects to address.   Pertinent labs: Lab Results  Component Value Date   CHOL 192 09/24/2019   HDL 67 09/24/2019   LDLCALC 110 (H) 09/24/2019   TRIG 83 09/24/2019   CHOLHDL 2.8 09/20/2018   Lab Results  Component  Value Date   NA 136 04/21/2020   K 4.8 04/21/2020   CREATININE 0.89 04/21/2020   GFRNONAA >60 10/22/2019   GFRAA >60 10/22/2019   GLUCOSE 103 (H) 04/21/2020     The ASCVD Risk score (Goff DC Jr., et al., 2013) failed to calculate for the following reasons:   The 2013 ASCVD risk score is only valid for ages 76 to 46   ---------------------------------------------------------------------------------------------------      Medications: Outpatient Medications Prior to Visit  Medication Sig   acetaminophen (TYLENOL) 500 MG tablet Take 500 mg by mouth every 6 (six) hours as needed.   Calcium Carbonate-Vit D-Min (CALCIUM 1200 PO) Take by mouth daily.    chlorpheniramine (CHLOR-TRIMETON) 4 MG tablet Take 4 mg by mouth as needed for allergies.   cholecalciferol (VITAMIN D3) 25 MCG (1000 UT) tablet Take 1,000 Units by mouth daily.   denosumab (PROLIA) 60 MG/ML SOSY injection Inject into the skin.   divalproex (DEPAKOTE ER) 500 MG 24 hr tablet Take 1 tablet (500 mg total) by mouth at bedtime.   fluticasone (FLONASE) 50 MCG/ACT nasal spray SPRAY 2 SPRAYS INTO EACH NOSTRIL EVERY DAY   losartan (COZAAR) 50 MG tablet Take 1 tablet (50 mg total) by mouth daily.   polyethylene glycol powder (GLYCOLAX/MIRALAX) powder Take 1 Container by mouth as needed.    Propylene Glycol (SYSTANE BALANCE) 0.6 % SOLN Apply to eye as needed.   sodium chloride (OCEAN) 0.65 %  SOLN nasal spray Place 1 spray into both nostrils as needed for congestion.   timolol (TIMOPTIC) 0.5 % ophthalmic solution Place 1 drop into both eyes at bedtime.   No facility-administered medications prior to visit.   Review of Systems  Constitutional:  Negative for chills, fatigue and fever.  HENT:  Negative for ear pain, sinus pressure, sinus pain and sore throat.   Eyes:  Negative for pain and visual disturbance.  Respiratory:  Negative for cough, chest tightness, shortness of breath and wheezing.   Cardiovascular:  Positive for leg  swelling. Negative for chest pain and palpitations.  Gastrointestinal:  Negative for abdominal pain, blood in stool, diarrhea, nausea and vomiting.  Genitourinary:  Negative for flank pain, frequency, pelvic pain and urgency.  Musculoskeletal:  Negative for back pain, myalgias and neck pain.  Neurological:  Negative for dizziness, weakness, light-headedness, numbness and headaches.      Objective    BP 126/78 (BP Location: Right Arm, Patient Position: Sitting, Cuff Size: Normal)   Pulse 81   Temp 98.1 F (36.7 C) (Oral)   Wt 121 lb (54.9 kg)   SpO2 100%   BMI 18.40 kg/m     Physical Exam Vitals reviewed.  Constitutional:      General: She is not in acute distress.    Appearance: Normal appearance. She is well-developed. She is not diaphoretic.  HENT:     Head: Normocephalic and atraumatic.  Eyes:     General: No scleral icterus.    Conjunctiva/sclera: Conjunctivae normal.  Neck:     Thyroid: No thyromegaly.  Cardiovascular:     Rate and Rhythm: Normal rate and regular rhythm.     Pulses: Normal pulses.     Heart sounds: Normal heart sounds. No murmur heard. Pulmonary:     Effort: Pulmonary effort is normal. No respiratory distress.     Breath sounds: Normal breath sounds. No wheezing, rhonchi or rales.  Musculoskeletal:        General: Swelling present.     Cervical back: Neck supple.     Right lower leg: No edema.     Left lower leg: No edema.  Lymphadenopathy:     Cervical: No cervical adenopathy.  Skin:    General: Skin is warm and dry.     Findings: No rash.  Neurological:     Mental Status: She is alert and oriented to person, place, and time. Mental status is at baseline.  Psychiatric:        Mood and Affect: Mood normal.        Behavior: Behavior normal.    No results found for any visits on 08/28/20.  Assessment & Plan     Problem List Items Addressed This Visit       Cardiovascular and Mediastinum   (HFpEF) heart failure with preserved ejection  fraction ([place])    Reviewed last Echo in 2019 - EF 65% with grade 1 diastolic dysfunction Asymptomatic and euvolemic on exam today       Hypertension - Primary    Well controlled on manual recheck Continue current medications Reviewed recent metabolic panel         Endocrine   Subclinical hypothyroidism    Recheck TSH and free T4 at next visit Not on meds cotninue to monitor         Return in about 3 months (around 11/28/2020) for AWV, CPE, With new PCP.      I,Essence Turner,acting as a Education administrator for Lavon Paganini, MD.,have documented  all relevant documentation on the behalf of Lavon Paganini, MD,as directed by  Lavon Paganini, MD while in the presence of Lavon Paganini, MD.  I, Lavon Paganini, MD, have reviewed all documentation for this visit. The documentation on 08/29/20 for the exam, diagnosis, procedures, and orders are all accurate and complete.   Hagan Vanauken, Dionne Bucy, MD, MPH [facility]

## 2020-08-29 NOTE — Assessment & Plan Note (Signed)
Well controlled on manual recheck Continue current medications Reviewed recent metabolic panel

## 2020-08-29 NOTE — Assessment & Plan Note (Signed)
Reviewed last Echo in 2019 - EF 65% with grade 1 diastolic dysfunction Asymptomatic and euvolemic on exam today

## 2020-08-29 NOTE — Assessment & Plan Note (Signed)
Recheck TSH and free T4 at next visit Not on meds cotninue to monitor

## 2020-10-08 DIAGNOSIS — M810 Age-related osteoporosis without current pathological fracture: Secondary | ICD-10-CM | POA: Diagnosis not present

## 2020-10-08 DIAGNOSIS — H40053 Ocular hypertension, bilateral: Secondary | ICD-10-CM | POA: Diagnosis not present

## 2020-11-06 ENCOUNTER — Encounter: Payer: Self-pay | Admitting: Neurology

## 2020-11-06 ENCOUNTER — Ambulatory Visit: Payer: Medicare PPO | Admitting: Neurology

## 2020-11-06 VITALS — BP 142/84 | HR 78 | Ht 67.0 in | Wt 124.0 lb

## 2020-11-06 DIAGNOSIS — Z8679 Personal history of other diseases of the circulatory system: Secondary | ICD-10-CM | POA: Diagnosis not present

## 2020-11-06 DIAGNOSIS — G40209 Localization-related (focal) (partial) symptomatic epilepsy and epileptic syndromes with complex partial seizures, not intractable, without status epilepticus: Secondary | ICD-10-CM

## 2020-11-06 DIAGNOSIS — R4189 Other symptoms and signs involving cognitive functions and awareness: Secondary | ICD-10-CM | POA: Diagnosis not present

## 2020-11-06 DIAGNOSIS — Z113 Encounter for screening for infections with a predominantly sexual mode of transmission: Secondary | ICD-10-CM | POA: Diagnosis not present

## 2020-11-06 MED ORDER — DIVALPROEX SODIUM ER 500 MG PO TB24: 500.0000 mg | ORAL_TABLET | Freq: Every day | ORAL | 3 refills | Status: DC

## 2020-11-06 NOTE — Progress Notes (Signed)
Chief Complaint  Patient presents with   Follow-up    New rm, with daughter, states she is doing well, concerned about memory    ASSESSMENT AND PLAN  Courtney Willis is a 86 y.o. female   Complex partial seizure History of left frontal intracranial hemorrhage  MRI of brain in December 2021 showed no acute abnormality, encephalomalacia left frontal lobe associated with  hemosiderin deposition, consistent with previous history of large left frontal intraparenchymal hemorrhage  EEG in April 2022 was normal  Keep Depakote ER 500mg  qhs.  Mild cognitive impairment  MoCA examination 23/30,  Laboratory evaluation including B12 to rule out treatable etiology   DIAGNOSTIC DATA (LABS, IMAGING, TESTING) - I reviewed patient records, labs, notes, testing and imaging myself where available.  MRI of brain on Jan 14 2020:  Encephalomalacia in the left lobe with associated hemosiderin deposition consistent with prior history of left frontal intracranial hemorrhage, which was acute on Jan 13 2018. Moderate atrophy.   HISTORICAL  Courtney Willis is a 86 year old female, seen in request by primary care PA Fenton Malling for evaluation of episode of acute aphasia, upper extremity tremor, initial evaluation was on January 01, 2020.  I reviewed and summarized the referring note.  Past medical history Hypertension Left frontal Intracranial bleeding  Patient suffered left frontal intracranial bleeding in December 2019, presented with difficulty speaking, right arm weakness, for extensive rehabilitation, regained significant function, at baseline, 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 at [facility] [place], still drives.  October 22, 2019, while she was eating brunch with her friend, she suddenly developed slurred speech, there was described whole body tremor, lasting for few minutes, she was taken by her friend to emergency room, by then, she was able to make phone call to her son, she was back to her baseline  She  denies similar episode in the past  I personally reviewed CT head without contrast on October 22, 2019, generalized atrophy, chronic left frontal infarction, no acute intracranial abnormality  CT angiogram of head and neck December 2019, no large vessel disease  MRI of the brain on January 13, 2018 large left frontal intraparenchymal hemorrhage, with 4 mm rightward midline shift,  Laboratory evaluation in 2021: CMP, creatinine of 0.6, CBC, hemoglobin of 12.9, INR 0.9, vitamin D 51, TSH 4.5,  She was put on Depakote ER 500 mg every night for her complex partial seizure since September 2021, repeat MRI of the brain in December 2021 showed encephalomalacia in the left frontal lobe associated with hemosiderin deposition consistent with her previous history of large left frontal intracranial hemorrhage, which was acute in December 2019, multiple morbid generalized atrophy  UPDATE Oct 6th 2022: She is overall doing very well, accompanied by her daughter at today's clinical visit, 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, retired Nurse, adult, traveled all over the world, still taking college classes regularly,  She had no recurrent seizure, taking Depakote ER 500 mg every night, Depakote level was 40 in March 2022, normal TSH, CBC CMP  Her daughter is concerned about her mild short-term memory loss, MoCA examination 23/30 today, mild visual spatial disorientation, short-term memory loss, she still drives short distance on familiar route, denied lost while driving, daughter feels safe for driving short distance during daytime now   PHYSICAL EXAM   Vitals:   04/21/20 1518  BP: 124/73  Pulse: 93  Weight: 126 lb (57.2 kg)  Height: 5\' 8"  (1.727 m)   Not recorded     Body mass index  is 19.16 kg/m.  PHYSICAL EXAMNIATION:  Gen: NAD, conversant, well nourised, well groomed           NEUROLOGICAL EXAM:  MENTAL STATUS: Speech/cognition: Awake, alert oriented to history taking and  casual conversation  Montreal Cognitive Assessment  11/06/2020  Visuospatial/ Executive (0/5) 3  Naming (0/3) 3  Attention: Read list of digits (0/2) 2  Attention: Read list of letters (0/1) 1  Attention: Serial 7 subtraction starting at 100 (0/3) 1  Language: Repeat phrase (0/2) 2  Language : Fluency (0/1) 1  Abstraction (0/2) 2  Delayed Recall (0/5) 2  Orientation (0/6) 6  Total 23  Adjusted Score (based on education) 23      CRANIAL NERVES: CN II: Visual fields are full to confrontation. Pupils are round equal and briskly reactive to light. CN III, IV, VI: extraocular movement are normal. No ptosis. CN V: Facial sensation is intact to light touch CN VII: Face is symmetric with normal eye closure  CN VIII: Hearing is normal to causal conversation. CN IX, X: Phonation is normal. CN XI: Head turning and shoulder shrug are intact  MOTOR: There is no pronator drift of out-stretched arms. Muscle bulk and tone are normal. Muscle strength is normal.  REFLEXES: Reflexes are 2+ and symmetric at the biceps, triceps, knees, and ankles. Plantar responses are flexor.  SENSORY: Intact to light touch, pinprick and vibratory sensation are intact in fingers and toes.  COORDINATION: There is no trunk or limb dysmetria noted.  GAIT/STANCE: She can get up from seated position arm crossed, slight unsteady   REVIEW OF SYSTEMS: Full 14 system review of systems performed and notable only for as above All other review of systems were negative.  ALLERGIES: No Known Allergies  HOME MEDICATIONS: Current Outpatient Medications  Medication Sig Dispense Refill   acetaminophen (TYLENOL) 500 MG tablet Take 500 mg by mouth every 6 (six) hours as needed.     Calcium Carbonate-Vit D-Min (CALCIUM 1200 PO) Take by mouth daily.      chlorpheniramine (CHLOR-TRIMETON) 4 MG tablet Take 4 mg by mouth as needed for allergies.     cholecalciferol (VITAMIN D3) 25 MCG (1000 UT) tablet Take 1,000 Units by  mouth daily.     denosumab (PROLIA) 60 MG/ML SOSY injection Inject into the skin.     divalproex (DEPAKOTE ER) 500 MG 24 hr tablet Take 1 tablet (500 mg total) by mouth at bedtime. 90 tablet 3   fluticasone (FLONASE) 50 MCG/ACT nasal spray SPRAY 2 SPRAYS INTO EACH NOSTRIL EVERY DAY 48 mL 3   losartan (COZAAR) 50 MG tablet Take 1 tablet (50 mg total) by mouth daily. 90 tablet 3   polyethylene glycol powder (GLYCOLAX/MIRALAX) powder Take 1 Container by mouth as needed.      Propylene Glycol (SYSTANE BALANCE) 0.6 % SOLN Apply to eye as needed.     sodium chloride (OCEAN) 0.65 % SOLN nasal spray Place 1 spray into both nostrils as needed for congestion.     timolol (TIMOPTIC) 0.5 % ophthalmic solution Place 1 drop into both eyes at bedtime.  5   No current facility-administered medications for this visit.    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   Post menopausal syndrome 08/12/2014   Stroke [facility])     PAST SURGICAL HISTORY: Past Surgical History:  Procedure Laterality Date   BUNIONECTOMY     05/2004, 2007   CATARACT EXTRACTION     04/2005, 06/2005   COLONOSCOPY W/ POLYPECTOMY     COLONOSCOPY WITH PROPOFOL N/A 02/15/2017   Procedure: COLONOSCOPY WITH PROPOFOL;  Surgeon: Jonathon Bellows, MD;  Location: [facility] ENDOSCOPY;  Service: Gastroenterology;  Laterality: N/A;   HYSTEROSCOPY  2011   ORIF WRIST FRACTURE Right 08/30/2015   Procedure: OPEN REDUCTION INTERNAL FIXATION (ORIF) RIGHT WRIST FRACTURE AND REPAIR AS NECESSARY;  Surgeon: Roseanne Kaufman, MD;   Location: [facility];  Service: Orthopedics;  Laterality: Right;   UPPER GI ENDOSCOPY     WRIST FRACTURE SURGERY Left    06/2006    FAMILY HISTORY: Family History  Problem Relation Age of Onset   Rheum arthritis Mother    Lung cancer Father    Ulcers Father    Hodgkin's lymphoma Sister    AVM Daughter    Breast cancer Paternal Grandmother     SOCIAL HISTORY: Social History   Socioeconomic History   Marital status: Divorced    Spouse name: Not on file   Number of children: 2   Years of education: college   Highest education level: Master's degree (e.g., MA, MS, MEng, MEd, MSW, MBA)  Occupational History   Occupation: retired  Tobacco Use   Smoking status: Former   Smokeless tobacco: Never   Tobacco comments:    (08/29/15) quit 40 years ago  Vaping Use   Vaping Use: Never used  Substance and Sexual Activity   Alcohol use: Yes    Alcohol/week: 0.0 standard drinks    Comment: occasional   Drug use: No   Sexual activity: Not on file  Other Topics Concern   Not on file  Social History Narrative   Lives alone.   Right-handed.   Caffeine use: 1 cup in the morning and sometimes a decaf cup in the afternoon   Social Determinants of Health   Financial Resource Strain: Not on file  Food Insecurity: Not on file  Transportation Needs: Not on file  Physical Activity: Not on file  Stress: Not on file  Social Connections: Not on file  Intimate Partner Violence: Not on file    Marcial Pacas, M.D. Ph.D.  [facility] [address] [place], [place] [address] Ph: [phone] Fax: [phone]  CC:  Mar Daring, PA-C [address] [place],  [place] [address]

## 2020-11-07 LAB — RPR: RPR Ser Ql: NONREACTIVE

## 2020-11-07 LAB — VITAMIN B12: Vitamin B-12: 441 pg/mL (ref 232–1245)

## 2020-11-11 ENCOUNTER — Telehealth: Payer: Self-pay

## 2020-11-11 ENCOUNTER — Other Ambulatory Visit: Payer: Self-pay | Admitting: Family Medicine

## 2020-11-11 DIAGNOSIS — Z1231 Encounter for screening mammogram for malignant neoplasm of breast: Secondary | ICD-10-CM

## 2020-11-11 NOTE — Telephone Encounter (Signed)
I called patient. I advised her that Dr. Krista Blue reviewed her labs and there was not any significant abnormality. Patient asked for a 6 month follow up and this was scheduled for 05/12/2021 at 2:30pm. Pt verbalized understanding of results. Pt had no questions at this time but was encouraged to call back if questions arise.

## 2020-11-11 NOTE — Telephone Encounter (Signed)
Copied from [place] [phone]. Topic: General - Other >> Nov 11, 2020  2:18 PM [name], [name] wrote: Reason for CRM: Pt wanted to know if Tally Joe would be able to send in an order to get a mammogram to [facility] center, pt wanted to have this done before her appt on 10/31 if possible.

## 2020-11-11 NOTE — Progress Notes (Signed)
See telephone note from 11/11/20.

## 2020-11-13 ENCOUNTER — Other Ambulatory Visit: Payer: Self-pay | Admitting: Family Medicine

## 2020-11-13 DIAGNOSIS — Z1231 Encounter for screening mammogram for malignant neoplasm of breast: Secondary | ICD-10-CM

## 2020-12-01 ENCOUNTER — Encounter: Payer: Self-pay | Admitting: Family Medicine

## 2020-12-01 ENCOUNTER — Other Ambulatory Visit: Payer: Self-pay

## 2020-12-01 ENCOUNTER — Ambulatory Visit (INDEPENDENT_AMBULATORY_CARE_PROVIDER_SITE_OTHER): Payer: Medicare PPO | Admitting: Family Medicine

## 2020-12-01 VITALS — BP 122/84 | HR 69 | Temp 97.4°F | Ht 68.0 in | Wt 123.7 lb

## 2020-12-01 DIAGNOSIS — R7309 Other abnormal glucose: Secondary | ICD-10-CM | POA: Diagnosis not present

## 2020-12-01 DIAGNOSIS — G40209 Localization-related (focal) (partial) symptomatic epilepsy and epileptic syndromes with complex partial seizures, not intractable, without status epilepticus: Secondary | ICD-10-CM

## 2020-12-01 DIAGNOSIS — I5032 Chronic diastolic (congestive) heart failure: Secondary | ICD-10-CM | POA: Diagnosis not present

## 2020-12-01 DIAGNOSIS — Z0000 Encounter for general adult medical examination without abnormal findings: Secondary | ICD-10-CM

## 2020-12-01 DIAGNOSIS — I611 Nontraumatic intracerebral hemorrhage in hemisphere, cortical: Secondary | ICD-10-CM | POA: Diagnosis not present

## 2020-12-01 DIAGNOSIS — I6523 Occlusion and stenosis of bilateral carotid arteries: Secondary | ICD-10-CM | POA: Insufficient documentation

## 2020-12-01 DIAGNOSIS — E854 Organ-limited amyloidosis: Secondary | ICD-10-CM

## 2020-12-01 DIAGNOSIS — Z1231 Encounter for screening mammogram for malignant neoplasm of breast: Secondary | ICD-10-CM | POA: Diagnosis not present

## 2020-12-01 DIAGNOSIS — I8311 Varicose veins of right lower extremity with inflammation: Secondary | ICD-10-CM

## 2020-12-01 DIAGNOSIS — I619 Nontraumatic intracerebral hemorrhage, unspecified: Secondary | ICD-10-CM

## 2020-12-01 DIAGNOSIS — E7800 Pure hypercholesterolemia, unspecified: Secondary | ICD-10-CM

## 2020-12-01 DIAGNOSIS — I680 Cerebral amyloid angiopathy: Secondary | ICD-10-CM

## 2020-12-01 DIAGNOSIS — I609 Nontraumatic subarachnoid hemorrhage, unspecified: Secondary | ICD-10-CM

## 2020-12-01 DIAGNOSIS — N644 Mastodynia: Secondary | ICD-10-CM

## 2020-12-01 DIAGNOSIS — I8312 Varicose veins of left lower extremity with inflammation: Secondary | ICD-10-CM

## 2020-12-01 DIAGNOSIS — R413 Other amnesia: Secondary | ICD-10-CM

## 2020-12-01 DIAGNOSIS — I10 Essential (primary) hypertension: Secondary | ICD-10-CM

## 2020-12-01 DIAGNOSIS — I872 Venous insufficiency (chronic) (peripheral): Secondary | ICD-10-CM

## 2020-12-01 DIAGNOSIS — G8929 Other chronic pain: Secondary | ICD-10-CM

## 2020-12-01 NOTE — Assessment & Plan Note (Signed)
Uses large calendar Grateful to live in [place]

## 2020-12-01 NOTE — Assessment & Plan Note (Signed)
Chronic, stable Denies CP, SOB, DOE

## 2020-12-01 NOTE — Assessment & Plan Note (Signed)
[place] pending; denies s/s outside of ST memory loss Hx of hemm CVA

## 2020-12-01 NOTE — Progress Notes (Signed)
Complete physical exam   Patient: Courtney Willis   DOB: February 15, 1934   86 y.o. Female  MRN: [id number] Visit Date: 12/01/2020  Today's healthcare provider: Gwyneth Sprout, FNP   Chief Complaint  Patient presents with   Annual Exam   Subjective    Courtney Willis is a 86 y.o. female who presents today for a complete physical exam.  She reports consuming a general diet. The patient does not participate in regular exercise at present. She generally feels well. She reports sleeping well. She does not have additional problems to discuss today.  HPI  -Last Colonoscopy Feb 15 2017   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   Avitaminosis D 08/12/2014   Bowel disease 02/16/2008   Bunion 08/12/2014   Cancer ([place])    Skin cancer- basal- upper arm , upper chest   CN (constipation) 08/12/2014   DD (diverticular disease) 02/16/2008   Elevated liver enzymes 08/12/2014   Fibroids, submucosal 08/12/2014   of her lower lip    Hypercholesteremia 08/12/2014   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   Post menopausal syndrome 08/12/2014   Stroke [facility])    Past Surgical History:  Procedure Laterality Date   BUNIONECTOMY     05/2004, 2007   CATARACT EXTRACTION     04/2005, 06/2005   COLONOSCOPY W/ POLYPECTOMY     COLONOSCOPY WITH PROPOFOL N/A 02/15/2017   Procedure: COLONOSCOPY WITH PROPOFOL;  Surgeon: Jonathon Bellows, MD;  Location[facility] ENDOSCOPY;  Service: Gastroenterology;  Laterality: N/A;   HYSTEROSCOPY  2011   ORIF WRIST FRACTURE Right 08/30/2015   Procedure: OPEN REDUCTION INTERNAL FIXATION (ORIF) RIGHT WRIST FRACTURE AND REPAIR AS NECESSARY;  Surgeon: Roseanne Kaufman, MD;  Location: [place];  Service: Orthopedics;  Laterality: Right;   UPPER GI ENDOSCOPY     WRIST FRACTURE SURGERY Left    06/2006   Social History   Socioeconomic History   Marital status: Divorced    Spouse name: Not on file   Number of children: 2   Years of education: college    Highest education level: Master's degree (e.g., MA, MS, MEng, MEd, MSW, MBA)  Occupational History   Occupation: retired  Tobacco Use   Smoking status: Former   Smokeless tobacco: Never   Tobacco comments:    (08/29/15) quit 40 years ago  Vaping Use   Vaping Use: Never used  Substance and Sexual Activity   Alcohol use: Yes    Alcohol/week: 0.0 standard drinks    Comment: occasional   Drug use: No   Sexual activity: Not on file  Other Topics Concern   Not on file  Social History Narrative   Lives alone.   Right-handed.   Caffeine use: 1 cup in the morning and sometimes a decaf cup in the afternoon   Social Determinants of Health   Financial Resource Strain: Not on file  Food Insecurity: Not on file  Transportation Needs: Not on file  Physical Activity: Not on file  Stress: Not on file  Social Connections: Not on file  Intimate Partner Violence: Not on file   Family Status  Relation Name Status   Mother  Deceased at age 70       05-06-13   Father  Deceased at age 25   Sister  Deceased at age 27   Daughter  Alive   Son  Alive   [place]  (Not Specified)  Family History  Problem Relation Age of Onset   Rheum arthritis Mother    Lung cancer Father    Ulcers Father    Hodgkin's lymphoma Sister    AVM Daughter    Breast cancer Paternal Grandmother    No Known Allergies  Patient Care Team: Gwyneth Sprout, FNP as PCP - General (Family Medicine) Dingeldein, Remo Lipps, MD as Consulting Physician (Ophthalmology) Dasher, Rayvon Char, MD as Consulting Physician (Dermatology) Frann Rider, NP as Nurse Practitioner (Neurology)   Medications: Outpatient Medications Prior to Visit  Medication Sig   acetaminophen (TYLENOL) 500 MG tablet Take 500 mg by mouth every 6 (six) hours as needed.   Calcium Carbonate-Vit D-Min (CALCIUM 1200 PO) Take by mouth daily.    chlorpheniramine (CHLOR-TRIMETON) 4 MG tablet Take 4 mg by mouth as needed for allergies.   cholecalciferol (VITAMIN D3) 25  MCG (1000 UT) tablet Take 1,000 Units by mouth daily.   denosumab (PROLIA) 60 MG/ML SOSY injection Inject into the skin.   divalproex (DEPAKOTE ER) 500 MG 24 hr tablet Take 1 tablet (500 mg total) by mouth at bedtime.   losartan (COZAAR) 50 MG tablet Take 1 tablet (50 mg total) by mouth daily.   polyethylene glycol powder (GLYCOLAX/MIRALAX) powder Take 1 Container by mouth as needed.    Propylene Glycol (SYSTANE BALANCE) 0.6 % SOLN Apply to eye as needed.   sodium chloride (OCEAN) 0.65 % SOLN nasal spray Place 1 spray into both nostrils as needed for congestion.   timolol (TIMOPTIC) 0.5 % ophthalmic solution Place 1 drop into both eyes at bedtime.   [DISCONTINUED] fluticasone (FLONASE) 50 MCG/ACT nasal spray SPRAY 2 SPRAYS INTO EACH NOSTRIL EVERY DAY (Patient not taking: Reported on 12/01/2020)   No facility-administered medications prior to visit.    Review of Systems  Cardiovascular:  Positive for leg swelling.  Hematological:  Bruises/bleeds easily.  Psychiatric/Behavioral:  Positive for decreased concentration.    Last CBC Lab Results  Component Value Date   WBC 6.6 04/21/2020   HGB 12.0 04/21/2020   HCT 34.8 04/21/2020   MCV 91 04/21/2020   MCH 31.4 04/21/2020   RDW 11.8 04/21/2020   PLT 271 76/54/6503   Last metabolic panel Lab Results  Component Value Date   GLUCOSE 103 (H) 04/21/2020   NA 136 04/21/2020   K 4.8 04/21/2020   CL 97 04/21/2020   CO2 22 04/21/2020   BUN 25 04/21/2020   CREATININE 0.89 04/21/2020   EGFR 64 04/21/2020   CALCIUM 9.5 04/21/2020   PROT 6.2 04/21/2020   ALBUMIN 4.2 04/21/2020   LABGLOB 2.0 04/21/2020   AGRATIO 2.1 04/21/2020   BILITOT 0.5 04/21/2020   ALKPHOS 47 04/21/2020   AST 23 04/21/2020   ALT 24 04/21/2020   ANIONGAP 9 10/22/2019   Last lipids Lab Results  Component Value Date   CHOL 192 09/24/2019   HDL 67 09/24/2019   LDLCALC 110 (H) 09/24/2019   TRIG 83 09/24/2019   CHOLHDL 2.8 09/20/2018   Last hemoglobin A1c Lab  Results  Component Value Date   HGBA1C 5.3 01/14/2018      Objective    BP 122/84 (BP Location: Right Arm, Patient Position: Sitting, Cuff Size: Normal)   Pulse 69   Temp (!) 97.4 F (36.3 C) (Temporal)   Ht '5\' 8"'  (1.727 m)   Wt 123 lb 11.2 oz (56.1 kg)   SpO2 99%   BMI 18.81 kg/m  BP Readings from Last 3 Encounters:  12/01/20 122/84  11/06/20 Marland Kitchen)  142/84  08/28/20 126/78   Wt Readings from Last 3 Encounters:  12/01/20 123 lb 11.2 oz (56.1 kg)  11/06/20 124 lb (56.2 kg)  08/28/20 121 lb (54.9 kg)      Physical Exam Vitals and nursing note reviewed.  Constitutional:      General: She is awake. She is not in acute distress.    Appearance: Normal appearance. She is well-developed, well-groomed and normal weight. She is not ill-appearing, toxic-appearing or diaphoretic.  HENT:     Head: Normocephalic and atraumatic.     Jaw: There is normal jaw occlusion. No trismus, tenderness, swelling or pain on movement.     Right Ear: Hearing, tympanic membrane, ear canal and external ear normal. There is no impacted cerumen.     Left Ear: Hearing, tympanic membrane, ear canal and external ear normal. There is no impacted cerumen.     Nose: Nose normal. No congestion or rhinorrhea.     Right Turbinates: Not enlarged, swollen or pale.     Left Turbinates: Not enlarged, swollen or pale.     Right Sinus: No maxillary sinus tenderness or frontal sinus tenderness.     Left Sinus: No maxillary sinus tenderness or frontal sinus tenderness.     Mouth/Throat:     Lips: Pink.     Mouth: Mucous membranes are moist. No injury.     Tongue: No lesions.     Pharynx: Oropharynx is clear. Uvula midline. No pharyngeal swelling, oropharyngeal exudate, posterior oropharyngeal erythema or uvula swelling.     Tonsils: No tonsillar exudate or tonsillar abscesses.  Eyes:     General: Lids are normal. Lids are everted, no foreign bodies appreciated. Vision grossly intact. Gaze aligned appropriately. No  allergic shiner or visual field deficit.       Right eye: No discharge.        Left eye: No discharge.     Extraocular Movements: Extraocular movements intact.     Conjunctiva/sclera: Conjunctivae normal.     Right eye: Right conjunctiva is not injected. No exudate.    Left eye: Left conjunctiva is not injected. No exudate.    Pupils: Pupils are equal, round, and reactive to light.  Neck:     Thyroid: No thyroid mass, thyromegaly or thyroid tenderness.     Vascular: Carotid bruit present.     Trachea: Trachea normal.     Comments: Sent for US carotid R > L Cardiovascular:     Rate and Rhythm: Normal rate and regular rhythm.     Pulses: Normal pulses.          Carotid pulses are 2+ on the right side and 2+ on the left side.      Radial pulses are 2+ on the right side and 2+ on the left side.       Dorsalis pedis pulses are 2+ on the right side and 2+ on the left side.       Posterior tibial pulses are 2+ on the right side and 2+ on the left side.     Heart sounds: Normal heart sounds, S1 normal and S2 normal. No murmur heard.   No friction rub. No gallop.  Pulmonary:     Effort: Pulmonary effort is normal. No respiratory distress.     Breath sounds: Normal breath sounds and air entry. No stridor. No wheezing, rhonchi or rales.  Chest:     Chest wall: No tenderness.     Comments: Breast exam deferred; discussed self exam Abdominal:  General: Abdomen is flat. Bowel sounds are normal. There is no distension.     Palpations: Abdomen is soft. There is no mass.     Tenderness: There is no abdominal tenderness. There is no right CVA tenderness, left CVA tenderness, guarding or rebound.     Hernia: No hernia is present.  Genitourinary:    Comments: Exam deferred; denies complaints Musculoskeletal:        General: No swelling, tenderness, deformity or signs of injury. Normal range of motion.     Cervical back: Full passive range of motion without pain, normal range of motion and neck  supple. No edema, rigidity or tenderness. No muscular tenderness.     Right lower leg: No edema.     Left lower leg: No edema.  Lymphadenopathy:     Cervical: No cervical adenopathy.     Right cervical: No superficial, deep or posterior cervical adenopathy.    Left cervical: No superficial, deep or posterior cervical adenopathy.  Skin:    General: Skin is warm and dry.     Capillary Refill: Capillary refill takes less than 2 seconds.     Coloration: Skin is not jaundiced or pale.     Findings: No bruising, erythema, lesion or rash.  Neurological:     General: No focal deficit present.     Mental Status: She is alert and oriented to person, place, and time. Mental status is at baseline.     GCS: GCS eye subscore is 4. GCS verbal subscore is 5. GCS motor subscore is 6.     Sensory: Sensation is intact. No sensory deficit.     Motor: Motor function is intact. No weakness.     Coordination: Coordination is intact. Coordination normal.     Gait: Gait is intact. Gait normal.  Psychiatric:        Attention and Perception: Attention and perception normal.        Mood and Affect: Mood and affect normal.        Speech: Speech normal.        Behavior: Behavior normal. Behavior is cooperative.        Thought Content: Thought content normal.        Cognition and Memory: Cognition and memory normal.        Judgment: Judgment normal.     Last depression screening scores PHQ 2/9 Scores 12/01/2020 03/24/2020 09/20/2019  PHQ - 2 Score 0 0 0  PHQ- 9 Score 4 2 -   Last fall risk screening Fall Risk  12/01/2020  Falls in the past year? 0  Number falls in past yr: 0  Injury with Fall? 0  Comment -  Risk for fall due to : No Fall Risks  Follow up -   Last Audit-C alcohol use screening Alcohol Use Disorder Test (AUDIT) 12/01/2020  1. How often do you have a drink containing alcohol? 1  2. How many drinks containing alcohol do you have on a typical day when you are drinking? 1  3. How often do  you have six or more drinks on one occasion? 0  AUDIT-C Score 2  Alcohol Brief Interventions/Follow-up -   A score of 3 or more in women, and 4 or more in men indicates increased risk for alcohol abuse, EXCEPT if all of the points are from question 1   No results found for any visits on 12/01/20.  Assessment & Plan    Routine Health Maintenance and Physical Exam  Exercise Activities and  Dietary recommendations  Goals      Increase water intake     Recommend to continue current healthy diet and daily water intake of 6-8 glasses.          Immunization History  Administered Date(s) Administered   Fluad Quad(high Dose 65+) 09/20/2018   Influenza, High Dose Seasonal PF 11/24/2016   Influenza-Unspecified 10/13/2017, 12/17/2019, 11/13/2020   Pneumococcal Conjugate-13 07/02/2014   Pneumococcal Polysaccharide-23 07/01/2004, 11/05/2004   Td 01/14/2003, 04/08/2010   Tdap 04/08/2010, 08/26/2015   Zoster Recombinat (Shingrix) 10/13/2017, 02/21/2018, 02/21/2018   Zoster, Live 03/11/2005    Health Maintenance  Topic Date Due   COVID-19 Vaccine (1) Never done   COLONOSCOPY (Pts 45-4yr Insurance coverage will need to be confirmed)  02/16/2020   DEXA SCAN  11/21/2021   TETANUS/TDAP  08/25/2025   Pneumonia Vaccine 86 Years old  Completed   INFLUENZA VACCINE  Completed   Zoster Vaccines- Shingrix  Completed   HPV VACCINES  Aged Out    Discussed health benefits of physical activity, and encouraged her to engage in regular exercise appropriate for her age and condition.  Problem List Items Addressed This Visit       Cardiovascular and Mediastinum   (HFpEF) heart failure with preserved ejection fraction (HCC)   Cerebral amyloid angiopathy (HCC)   Chronic venous insufficiency   Varicose veins of both lower extremities   Hypertension   Bilateral carotid artery stenosis   Relevant Orders   [place]id Duplex Bilateral     Nervous and Auditory   ICH (intracerebral hemorrhage)  (HCC)   SAH (subarachnoid hemorrhage) (HCC)   Intraparenchymal hemorrhage of brain (HCC)   Relevant Orders   Valproic Acid level   Partial symptomatic epilepsy with complex partial seizures, not intractable, without status epilepticus ([place]   Relevant Orders   Valproic Acid level     Other   Hyperlipidemia   Encounter for screening mammogram for malignant neoplasm of breast   Relevant Orders   MM 3D SCREEN BREAST BILATERAL   Short-term memory loss - Primary   Elevated glucose level   Relevant Orders   Comprehensive metabolic panel   Hemoglobin A1c   Chronic breast pain   Relevant Orders   [place] BILATERAL   Other Visit Diagnoses     Annual physical exam       Encounter for subsequent annual wellness visit (AWV) in Medicare patient           Return in about 1 year (around 12/01/2021) for annual examination.     IVonna Kotyk FNP, have reviewed all documentation for this visit. The documentation on 12/01/20 for the exam, diagnosis, procedures, and orders are all accurate and complete.    EGwyneth Sprout [phone](phone) [phone](fax)  [place]

## 2020-12-01 NOTE — Assessment & Plan Note (Signed)
[place] in addition to mammogram screening

## 2020-12-01 NOTE — Assessment & Plan Note (Signed)
LE present, not pitting-

## 2020-12-01 NOTE — Assessment & Plan Note (Signed)
We recommend diet low in saturated fat and regular exercise - 30 min at least 5 times per week

## 2020-12-01 NOTE — Assessment & Plan Note (Signed)
Repeat chemistry

## 2020-12-01 NOTE — Assessment & Plan Note (Signed)
Some report of breast tenderness; chronic per report

## 2020-12-02 LAB — COMPREHENSIVE METABOLIC PANEL
ALT: 29 IU/L (ref 0–32)
AST: 30 IU/L (ref 0–40)
Albumin/Globulin Ratio: 2.6 — ABNORMAL HIGH (ref 1.2–2.2)
Albumin: 4.4 g/dL (ref 3.6–4.6)
Alkaline Phosphatase: 39 IU/L — ABNORMAL LOW (ref 44–121)
BUN/Creatinine Ratio: 15 (ref 12–28)
BUN: 13 mg/dL (ref 8–27)
Bilirubin Total: 0.7 mg/dL (ref 0.0–1.2)
CO2: 25 mmol/L (ref 20–29)
Calcium: 9.7 mg/dL (ref 8.7–10.3)
Chloride: 97 mmol/L (ref 96–106)
Creatinine, Ser: 0.87 mg/dL (ref 0.57–1.00)
Globulin, Total: 1.7 g/dL (ref 1.5–4.5)
Glucose: 89 mg/dL (ref 70–99)
Potassium: 5.2 mmol/L (ref 3.5–5.2)
Sodium: 134 mmol/L (ref 134–144)
Total Protein: 6.1 g/dL (ref 6.0–8.5)
eGFR: 66 mL/min/{1.73_m2} (ref >59)

## 2020-12-02 LAB — HEMOGLOBIN A1C
Est. average glucose Bld gHb Est-mCnc: 114 mg/dL
Hgb A1c MFr Bld: 5.6 % (ref 4.8–5.6)

## 2020-12-02 LAB — VALPROIC ACID LEVEL: Valproic Acid Lvl: 51 ug/mL (ref 50–100)

## 2020-12-02 NOTE — Progress Notes (Signed)
Please call to ensure understanding that all lab results are normal and continue with current regimen.  Please let us know if you have any questions.  Thank you,  Tally Joe, FNP

## 2020-12-31 ENCOUNTER — Other Ambulatory Visit: Payer: Self-pay

## 2020-12-31 ENCOUNTER — Ambulatory Visit
Admission: RE | Admit: 2020-12-31 | Discharge: 2020-12-31 | Disposition: A | Discharge status: Home / Self Care | Payer: Medicare PPO | Source: Ambulatory Visit | Attending: Family Medicine | Admitting: Family Medicine

## 2020-12-31 DIAGNOSIS — Z1231 Encounter for screening mammogram for malignant neoplasm of breast: Secondary | ICD-10-CM | POA: Diagnosis not present

## 2021-01-14 DIAGNOSIS — L570 Actinic keratosis: Secondary | ICD-10-CM | POA: Diagnosis not present

## 2021-01-14 DIAGNOSIS — L821 Other seborrheic keratosis: Secondary | ICD-10-CM | POA: Diagnosis not present

## 2021-01-14 DIAGNOSIS — D225 Melanocytic nevi of trunk: Secondary | ICD-10-CM | POA: Diagnosis not present

## 2021-01-14 DIAGNOSIS — D2261 Melanocytic nevi of right upper limb, including shoulder: Secondary | ICD-10-CM | POA: Diagnosis not present

## 2021-01-14 DIAGNOSIS — D2271 Melanocytic nevi of right lower limb, including hip: Secondary | ICD-10-CM | POA: Diagnosis not present

## 2021-01-14 DIAGNOSIS — D2272 Melanocytic nevi of left lower limb, including hip: Secondary | ICD-10-CM | POA: Diagnosis not present

## 2021-01-14 DIAGNOSIS — Z85828 Personal history of other malignant neoplasm of skin: Secondary | ICD-10-CM | POA: Diagnosis not present

## 2021-02-11 DIAGNOSIS — M810 Age-related osteoporosis without current pathological fracture: Secondary | ICD-10-CM | POA: Diagnosis not present

## 2021-02-18 DIAGNOSIS — M810 Age-related osteoporosis without current pathological fracture: Secondary | ICD-10-CM | POA: Diagnosis not present

## 2021-02-18 DIAGNOSIS — Z8781 Personal history of (healed) traumatic fracture: Secondary | ICD-10-CM | POA: Diagnosis not present

## 2021-02-18 DIAGNOSIS — R636 Underweight: Secondary | ICD-10-CM | POA: Diagnosis not present

## 2021-03-16 DIAGNOSIS — M810 Age-related osteoporosis without current pathological fracture: Secondary | ICD-10-CM | POA: Diagnosis not present

## 2021-03-22 ENCOUNTER — Other Ambulatory Visit: Payer: Self-pay | Admitting: Physician Assistant

## 2021-03-22 DIAGNOSIS — I10 Essential (primary) hypertension: Secondary | ICD-10-CM

## 2021-04-07 DIAGNOSIS — M810 Age-related osteoporosis without current pathological fracture: Secondary | ICD-10-CM | POA: Diagnosis not present

## 2021-04-13 DIAGNOSIS — H40053 Ocular hypertension, bilateral: Secondary | ICD-10-CM | POA: Diagnosis not present

## 2021-05-12 ENCOUNTER — Ambulatory Visit: Payer: Medicare PPO | Admitting: Neurology

## 2021-05-18 ENCOUNTER — Ambulatory Visit: Payer: Self-pay

## 2021-05-18 ENCOUNTER — Ambulatory Visit: Payer: Medicare PPO | Admitting: Neurology

## 2021-05-18 ENCOUNTER — Encounter: Payer: Self-pay | Admitting: Neurology

## 2021-05-18 VITALS — BP 157/89 | HR 76 | Ht 68.0 in | Wt 127.0 lb

## 2021-05-18 DIAGNOSIS — G40209 Localization-related (focal) (partial) symptomatic epilepsy and epileptic syndromes with complex partial seizures, not intractable, without status epilepticus: Secondary | ICD-10-CM

## 2021-05-18 DIAGNOSIS — Z8679 Personal history of other diseases of the circulatory system: Secondary | ICD-10-CM | POA: Diagnosis not present

## 2021-05-18 DIAGNOSIS — R4189 Other symptoms and signs involving cognitive functions and awareness: Secondary | ICD-10-CM | POA: Diagnosis not present

## 2021-05-18 MED ORDER — DIVALPROEX SODIUM ER 500 MG PO TB24: 500.0000 mg | ORAL_TABLET | Freq: Every day | ORAL | 3 refills | Status: DC

## 2021-05-18 NOTE — Progress Notes (Signed)
? ?Chief Complaint  ?Patient presents with  ? Follow-up  ?  Rm 14. Accompanied by daughter, [name]. ?History of left frontal intracranial hemorrhage. ?Denies any new seizures. C/o Memory difficulties. ?[place]oca 24/30.  ? ?ASSESSMENT AND PLAN ? ?Courtney Willis is a 87 y.o. female   ? ?History of left frontal intracranial hemorrhage in December 2019 ?Complex partial seizure, September 2021 ? MRI of brain in December 2021 showed no acute abnormality, encephalomalacia left frontal lobe associated with  hemosiderin deposition, consistent with previous history of large left frontal intraparenchymal hemorrhage ? EEG in April 2022 was normal ? Keep Depakote ER '500mg'$  qhs.  Depakote level was 51 in October 2022  ? ?Mild cognitive impairment ? MoCA examination 24/30 ? Most likely aging process, with vascular component due to previous left frontal lobe hemorrhage. ? Laboratory evaluation showed normal B12, TSH, no treatable etiology identified ?  ?DIAGNOSTIC DATA (LABS, IMAGING, TESTING) ?- I reviewed patient records, labs, notes, testing and imaging myself where available. ? ?MRI of brain on Jan 14 2020:  Encephalomalacia in the left lobe with associated hemosiderin deposition consistent with prior history of left frontal intracranial hemorrhage, which was acute on Jan 13 2018. Moderate atrophy. ? ? ?HISTORICAL ? ?Courtney Willis is a 87 year old female, seen in request by primary care PA Fenton Malling for evaluation of episode of acute aphasia, upper extremity tremor, initial evaluation was on January 01, 2020. ? ?I reviewed and summarized the referring note.  Past medical history ?Hypertension ?Left frontal Intracranial bleeding ? ?Patient suffered left frontal intracranial bleeding in December 2019, presented with difficulty speaking, right arm weakness, for extensive rehabilitation, regained significant function, at baseline, 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 at [facility] [place], still drives. ? ?October 22, 2019, while she was eating  brunch with her friend, she suddenly developed slurred speech, there was described whole body tremor, lasting for few minutes, she was taken by her friend to emergency room, by then, she was able to make phone call to her son, she was back to her baseline ? ?She denies similar episode in the past ? ?I personally reviewed CT head without contrast on October 22, 2019, generalized atrophy, chronic left frontal infarction, no acute intracranial abnormality ? ?CT angiogram of head and neck December 2019, no large vessel disease ? ?MRI of the brain on January 13, 2018 large left frontal intraparenchymal hemorrhage, with 4 mm rightward midline shift, ? ?Laboratory evaluation in 2021: CMP, creatinine of 0.6, CBC, hemoglobin of 12.9, INR 0.9, vitamin D 51, TSH 4.5, ? ?She was put on Depakote ER 500 mg every night for her complex partial seizure since September 2021, repeat MRI of the brain in December 2021 showed encephalomalacia in the left frontal lobe associated with hemosiderin deposition consistent with her previous history of large left frontal intracranial hemorrhage, which was acute in December 2019, multiple morbid generalized atrophy ? 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 ?She had no recurrent seizure, taking Depakote ER 500 mg every night, Depakote level was 40 in March 2022, normal TSH, CBC CMP ? ?Her daughter is concerned about her mild short-term memory loss, MoCA examination 23/30 today, mild visual spatial disorientation, short-term memory loss, she still drives short distance on familiar route, denied lost while driving, daughter feels safe for driving short distance during daytime now ? ?  UPDATE April 17th 2023: ?She is with her daughter [name] at today's visit,,  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,  no recurrent seizure, tolerating Depakote ER 500 mg daily, ? ?Daughter reported that she has mild worsening memory loss, she just got her license renewed for 5 years, she still drives short distance, goes to [employer] regularly, spent a lot of time with her friend ? ?She retired as a Warden/ranger, ? ?Daughter also reported that she has some personality change, again personally reviewed MRI of the brain in December 2021, encephalomalacia left frontal lobe with associated hemosiderin deposition, mild to moderate generalized atrophy ? ?PHYSICAL EXAM ?  ?Vitals:  ? 04/21/20 1518  ?BP: 124/73  ?Pulse: 93  ?Weight: 126 lb (57.2 kg)  ?Height: '5\' 8"'$  (1.727 m)  ? ? ? ?Body mass index is 19.16 kg/m?. ? ?PHYSICAL EXAMNIATION: ? ?Gen: NAD, conversant, well nourised, well groomed          ? ?NEUROLOGICAL EXAM: ? ?MENTAL STATUS: ?Speech/cognition: ?Awake, alert oriented to history taking and casual conversation ? ? ?  05/18/2021  ?  2:30 PM 11/06/2020  ?  3:07 PM  ?Montreal Cognitive Assessment   ?Visuospatial/ Executive (0/5) 5 3  ?Naming (0/3) 3 3  ?Attention: Read list of digits (0/2) 1 2  ?Attention: Read list of letters (0/1) 1 1  ?Attention: Serial 7 subtraction starting at 100 (0/3) 3 1  ?Language: Repeat phrase (0/2) 1 2  ?Language : Fluency (0/1) 0 1  ?Abstraction (0/2) 1 2  ?Delayed Recall (0/5) 3 2  ?Orientation (0/6) 6 6  ?Total 24 23  ?Adjusted Score (based on education) 24 23  ?  ?  ?CRANIAL NERVES: ?CN II: Visual fields are full to confrontation. Pupils are round equal and briskly reactive to light. ?CN III, IV, VI: extraocular movement are normal. No ptosis. ?CN V: Facial sensation is intact to light touch ?CN VII: Face is symmetric with normal eye closure  ?CN VIII: Hearing is normal to causal conversation. ?CN IX, X: Phonation is normal. ?CN XI: Head turning and shoulder shrug are intact ? ?MOTOR: ?There is no pronator drift of out-stretched arms. Muscle bulk and tone are normal.  Muscle strength is normal. ? ?REFLEXES: ?Reflexes are 2+ and symmetric at the biceps, triceps, knees, and ankles. Plantar responses are flexor. ? ?SENSORY: ?Intact to light touch, pinprick and vibratory sensation are intact in fingers and toes. ? ?COORDINATION: ?There is no trunk or limb dysmetria noted. ? ?GAIT/STANCE: ?She can get up from seated position arm crossed, slight unsteady ? ? ?REVIEW OF SYSTEMS: Full 14 system review of systems performed and notable only for as above ?All other review of systems were negative. ? ?ALLERGIES: ?No Known Allergies ? ?HOME MEDICATIONS: ?Current Outpatient Medications  ?Medication Sig Dispense Refill  ? acetaminophen (TYLENOL) 500 MG tablet Take 500 mg by mouth every 6 (six) hours as needed.    ? Calcium Carbonate-Vit D-Min (CALCIUM 1200 PO) Take by mouth daily.     ? chlorpheniramine (CHLOR-TRIMETON) 4 MG tablet Take 4 mg by mouth as needed for allergies.    ? cholecalciferol (VITAMIN D3) 25 MCG (1000 UT) tablet Take 1,000 Units by mouth daily.    ? denosumab (PROLIA) 60 MG/ML SOSY injection Inject into the skin.    ? divalproex (DEPAKOTE ER) 500 MG 24 hr tablet Take 1 tablet (500 mg total) by mouth at bedtime. 90 tablet 3  ? losartan (COZAAR) 50 MG tablet TAKE 1 TABLET BY MOUTH  EVERY DAY 90 tablet 3  ? polyethylene glycol powder (GLYCOLAX/MIRALAX) powder Take 1 Container by mouth as needed.     ? Propylene Glycol (SYSTANE BALANCE) 0.6 % SOLN Apply to eye as needed.    ? sodium chloride (OCEAN) 0.65 % SOLN nasal spray Place 1 spray into both nostrils as needed for congestion.    ? timolol (TIMOPTIC) 0.5 % ophthalmic solution Place 1 drop into both eyes at bedtime.  5  ? ?No current facility-administered medications for this visit.  ? ? ?PAST MEDICAL HISTORY: ?Past Medical History:  ?Diagnosis Date  ? Acid reflux 02/26/2008  ? Arthritis   ? Atrophy of vagina 08/12/2014  ? Avitaminosis D 08/12/2014  ? Bowel disease 02/16/2008  ? Bunion 08/12/2014  ? Cancer [facility])   ? Skin cancer-  basal- upper arm , upper chest  ? CN (constipation) 08/12/2014  ? DD (diverticular disease) 02/16/2008  ? Elevated liver enzymes 08/12/2014  ? Fibroids, submucosal 08/12/2014  ? of her lower lip   ? Hypercholes

## 2021-05-18 NOTE — Telephone Encounter (Signed)
Pt and daughter on the line stated pt has very bad swelling on both feet.  Pt stated, this has been ongoing for 3 years.  ?Pt and her daughter stated they could not make any of the appointments available.   ? ?Seeking clinical advice.  ? ? ?Chief Complaint: Swelling to feet,ankle x several months ?Symptoms: Above ?Frequency: Several months ago ?Pertinent Negatives: Patient denies chest pain, SOB ?Disposition: '[]'$ ED /'[]'$ Urgent Care (no appt availability in office) / '[x]'$ Appointment(In office/virtual)/ '[]'$  [facility] [facility]/ '[]'$ Home Care/ '[]'$ Refused Recommended Disposition /'[]'$ [facility]/ '[]'$  Follow-up with PCP ?Additional Notes: Requests appointment next week. Instructed to call back for worsening of symptoms.  ?Answer Assessment - Initial Assessment Questions ?1. ONSET: "When did the swelling start?" (e.g., minutes, hours, days) ?    Both feet and up shin ?2. LOCATION: "What part of the leg is swollen?"  "Are both legs swollen or just one leg?" ?    Both legs ?3. SEVERITY: "How bad is the swelling?" (e.g., localized; mild, moderate, severe) ? - Localized - small area of swelling localized to one leg ? - MILD pedal edema - swelling limited to foot and ankle, pitting edema < 1/4 inch (6 mm) deep, rest and elevation eliminate most or all swelling ? - MODERATE edema - swelling of lower leg to knee, pitting edema > 1/4 inch (6 mm) deep, rest and elevation only partially reduce swelling ? - SEVERE edema - swelling extends above knee, facial or hand swelling present  ?    Mild-moderate ?4. REDNESS: "Does the swelling look red or infected?" ?    No ?5. PAIN: "Is the swelling painful to touch?" If Yes, ask: "How painful is it?"   (Scale 1-10; mild, moderate or severe) ?    No ?6. FEVER: "Do you have a fever?" If Yes, ask: "What is it, how was it measured, and when did it start?"  ?    No ?7. CAUSE: "What do you think is causing the leg swelling?" ?    Unsure ?8. MEDICAL HISTORY: "Do you have a history of heart  failure, kidney disease, liver failure, or cancer?" ?    Yes ?9. RECURRENT SYMPTOM: "Have you had leg swelling before?" If Yes, ask: "When was the last time?" "What happened that time?" ?    Has had it for 3 years ?10. OTHER SYMPTOMS: "Do you have any other symptoms?" (e.g., chest pain, difficulty breathing) ?      No ?11. PREGNANCY: "Is there any chance you are pregnant?" "When was your last menstrual period?" ?      no ? ?Protocols used: Leg Swelling and Edema-A-AH ? ?

## 2021-05-29 ENCOUNTER — Ambulatory Visit: Payer: Medicare PPO | Admitting: Family Medicine

## 2021-05-29 ENCOUNTER — Encounter: Payer: Self-pay | Admitting: Family Medicine

## 2021-05-29 VITALS — BP 129/71 | HR 81 | Temp 98.6°F | Resp 16 | Ht 67.0 in | Wt 123.4 lb

## 2021-05-29 DIAGNOSIS — I5032 Chronic diastolic (congestive) heart failure: Secondary | ICD-10-CM

## 2021-05-29 DIAGNOSIS — I872 Venous insufficiency (chronic) (peripheral): Secondary | ICD-10-CM | POA: Insufficient documentation

## 2021-05-29 DIAGNOSIS — I5041 Acute combined systolic (congestive) and diastolic (congestive) heart failure: Secondary | ICD-10-CM | POA: Diagnosis not present

## 2021-05-29 DIAGNOSIS — I10 Essential (primary) hypertension: Secondary | ICD-10-CM

## 2021-05-29 MED ORDER — HYDROCHLOROTHIAZIDE 25 MG PO TABS: 25.0000 mg | ORAL_TABLET | Freq: Every day | ORAL | 1 refills | Status: DC

## 2021-05-29 NOTE — Assessment & Plan Note (Signed)
Chronic, stable however, LE edema worsened; denies diet change ?Recommend use of HCTZ 25 mg in addition to Losartan 50 mg to assist ?Cotin[place] to recommend card follow up- referral sent ?Hx of ECHO in 2019 at [facility] with normal EF 15%, grade 1 diastolic dysfunction ?RTC 1 month for edema f/u ? ? ?

## 2021-05-29 NOTE — Assessment & Plan Note (Signed)
Chronic, stable ?Toes with slight purple tinge ?However, sensation intact; normal temperature; no sores/ulcerations ?Continue to recommend use of medium compression hose ?Salt control in diet and adequate water intake  ?

## 2021-05-29 NOTE — Assessment & Plan Note (Signed)
Chronic, stable ?Denies CP ?Denies SOB/ DOE ?Denies low blood pressure/hypotension ?Denies vision changes ?No LE Edema noted on exam ?Continue medication, Losartan 50 mg; add HCTZ 25 mg for edema ?Denies side effects ?RTC 1 month ?Seek emergent care if you develop chest pain or chest pressure ? ?

## 2021-05-29 NOTE — Progress Notes (Signed)
?  ?I,Joseline E Rosas,acting as a scribe for Gwyneth Sprout, FNP.,have documented all relevant documentation on the behalf of Gwyneth Sprout, FNP,as directed by  Gwyneth Sprout, FNP while in the presence of Gwyneth Sprout, FNP.  ? ?Established patient visit ? ?Patient: Courtney Willis   DOB: Dec 03, 1934   87 y.o. Female  MRN: [id number] ?Visit Date: 05/29/2021 ? ?Today's healthcare provider: Gwyneth Sprout, FNP  ?Re Introduced to nurse practitioner role and practice setting.  All questions answered.  Discussed provider/patient relationship and expectations. ? ?Chief Complaint  ?Patient presents with  ? Edema  ? ?Subjective  ?  ?HPI  ?Edema: Patient complains of edema. The location of the edema is ankle(s) bilateral, feet bilateral.  The edema has been moderate.  Onset of symptoms was several months ago, gradually worsening since that time. The edema is present all day.  The swelling has been aggravated by nothing, relieved by elevation of involved area.  ? ?Wt Readings from Last 3 Encounters:  ?05/29/21 123 lb 6.4 oz (56 kg)  ?05/18/21 127 lb (57.6 kg)  ?12/01/20 123 lb 11.2 oz (56.1 kg)  ?  ?Medications: ?Outpatient Medications Prior to Visit  ?Medication Sig  ? acetaminophen (TYLENOL) 500 MG tablet Take 500 mg by mouth every 6 (six) hours as needed.  ? Calcium Carbonate-Vit D-Min (CALCIUM 1200 PO) Take by mouth daily.   ? chlorpheniramine (CHLOR-TRIMETON) 4 MG tablet Take 4 mg by mouth as needed for allergies.  ? cholecalciferol (VITAMIN D3) 25 MCG (1000 UT) tablet Take 1,000 Units by mouth daily.  ? denosumab (PROLIA) 60 MG/ML SOSY injection Inject into the skin.  ? divalproex (DEPAKOTE ER) 500 MG 24 hr tablet Take 1 tablet (500 mg total) by mouth at bedtime.  ? losartan (COZAAR) 50 MG tablet TAKE 1 TABLET BY MOUTH EVERY DAY  ? polyethylene glycol powder (GLYCOLAX/MIRALAX) powder Take 1 Container by mouth as needed.   ? Propylene Glycol (SYSTANE BALANCE) 0.6 % SOLN Apply to eye as needed.  ? sodium chloride (OCEAN) 0.65 %  SOLN nasal spray Place 1 spray into both nostrils as needed for congestion.  ? timolol (TIMOPTIC) 0.5 % ophthalmic solution Place 1 drop into both eyes at bedtime.  ? ?No facility-administered medications prior to visit.  ? ?Review of Systems ? ?  Objective  ?  ?BP 129/71 (BP Location: Right Arm, Patient Position: Sitting, Cuff Size: Normal)   Pulse 81   Temp 98.6 ?F (37 ?C) (Oral)   Resp 16   Ht '5\' 7"'$  (1.702 m)   Wt 123 lb 6.4 oz (56 kg)   BMI 19.33 kg/m?  ? ? ?Physical Exam ?Vitals and nursing note reviewed.  ?Constitutional:   ?   General: She is not in acute distress. ?   Appearance: Normal appearance. She is normal weight. She is not ill-appearing, toxic-appearing or diaphoretic.  ?HENT:  ?   Head: Normocephalic and atraumatic.  ?Cardiovascular:  ?   Rate and Rhythm: Normal rate and regular rhythm.  ?   Pulses: Normal pulses.  ?   Heart sounds: Normal heart sounds. No murmur heard. ?  No friction rub. No gallop.  ?Pulmonary:  ?   Effort: Pulmonary effort is normal. No respiratory distress.  ?   Breath sounds: Normal breath sounds. No stridor. No wheezing, rhonchi or rales.  ?Chest:  ?   Chest wall: No tenderness.  ?Abdominal:  ?   General: Bowel sounds are normal.  ?   Palpations: Abdomen  is soft.  ?Musculoskeletal:     ?   General: No swelling, tenderness, deformity or signs of injury. Normal range of motion.  ?   Right lower leg: 2+ Pitting Edema present.  ?   Left lower leg: 2+ Pitting Edema present.  ?Skin: ?   General: Skin is warm and dry.  ?   Capillary Refill: Capillary refill takes less than 2 seconds.  ?   Coloration: Skin is mottled. Skin is not jaundiced or pale.  ?   Findings: No bruising, erythema, lesion or rash.  ? ?    ?   Comments: BLE edema with discoloration consistent with venous insufficiency   ?Neurological:  ?   General: No focal deficit present.  ?   Mental Status: She is alert and oriented to person, place, and time. Mental status is at baseline.  ?   Cranial Nerves: No cranial  nerve deficit.  ?   Sensory: No sensory deficit.  ?   Motor: No weakness.  ?   Coordination: Coordination normal.  ?Psychiatric:     ?   Mood and Affect: Mood normal.     ?   Behavior: Behavior normal.     ?   Thought Content: Thought content normal.     ?   Judgment: Judgment normal.  ?  ? ?No results found for any visits on 05/29/21. ? Assessment & Plan  ?  ? ?Problem List Items Addressed This Visit   ? ?  ? Cardiovascular and Mediastinum  ? (HFpEF) heart failure with preserved ejection fraction ([place])  ?  Chronic, stable however, LE edema worsened; denies diet change ?Recommend use of HCTZ 25 mg in addition to Losartan 50 mg to assist ?[place] to recommend card follow up- referral sent ?Hx of ECHO in 2019 at [facility] with normal EF 68%, grade 1 diastolic dysfunction ?RTC 1 month for edema f/u ? ? ? ?  ?  ? Relevant Medications  ? hydrochlorothiazide (HYDRODIURIL) 25 MG tablet  ? RESOLVED: Acute combined systolic and diastolic heart failure ([place]) - Primary  ? Relevant Medications  ? hydrochlorothiazide (HYDRODIURIL) 25 MG tablet  ? Other Relevant Orders  ? Ambulatory referral to Cardiology  ? Hypertension  ?  Chronic, stable ?Denies CP ?Denies SOB/ DOE ?Denies low blood pressure/hypotension ?Denies vision changes ?No LE Edema noted on exam ?Continue medication, Losartan 50 mg; add HCTZ 25 mg for edema ?Denies side effects ?RTC 1 month ?Seek emergent care if you develop chest pain or chest pressure ? ?  ?  ? Relevant Medications  ? hydrochlorothiazide (HYDRODIURIL) 25 MG tablet  ? Venous insufficiency of both lower extremities  ?  Chronic, stable ?Toes with slight purple tinge ?However, sensation intact; normal temperature; no sores/ulcerations ?Continue to recommend use of medium compression hose ?Salt control in diet and adequate water intake  ? ?  ?  ? Relevant Medications  ? hydrochlorothiazide (HYDRODIURIL) 25 MG tablet  ? ?Return in about 1 month (around 06/28/2021) for HTN management, edema.  ?   ? ?I, Gwyneth Sprout, FNP, have reviewed all documentation for this visit. The documentation on 05/29/21 for the exam, diagnosis, procedures, and orders are all accurate and complete. ? ?Gwyneth Sprout, FNP  ?[place] ?[phone] (phone) ?[phone] (fax) ? ?[facility]  ?

## 2021-07-07 ENCOUNTER — Encounter: Payer: Self-pay | Admitting: Cardiology

## 2021-07-07 ENCOUNTER — Ambulatory Visit: Payer: Medicare PPO | Admitting: Cardiology

## 2021-07-07 VITALS — BP 140/72 | HR 76 | Ht 67.0 in | Wt 121.1 lb

## 2021-07-07 DIAGNOSIS — I10 Essential (primary) hypertension: Secondary | ICD-10-CM | POA: Diagnosis not present

## 2021-07-07 DIAGNOSIS — R600 Localized edema: Secondary | ICD-10-CM | POA: Diagnosis not present

## 2021-07-07 NOTE — Progress Notes (Signed)
Cardiology Office Note:    Date:  07/07/2021   ID:  Courtney Willis, DOB [birth year], MRN [id number]  PCP:  Gwyneth Sprout, FNP   [facility] Providers Cardiologist:  None     Referring MD: Gwyneth Sprout, FNP   Chief Complaint  Patient presents with   Other    CHF c/o edema feet/ankles. Meds reviewed verbally with pt.    History of Present Illness:    Courtney Willis is a 87 y.o. female with a hx of hypertension, hemorrhagic CVA 2019 who presents due to leg edema.  Patient states having worsening leg edema over several months now.  She had a similar assistant in facility.  Able to perform activities by herself although she endorses some metabolic issues.  Denies chest pain or shortness of breath.  Denies palpitations.  Evaluated by primary care provider about 6 weeks ago, HCTZ was added to her BP regimen.  Compression stockings also advised.  She states her edema has significantly improved.  Echocardiogram 2019 EF 65%.  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   Post menopausal syndrome 08/12/2014   Stroke [facility])     Past Surgical History:  Procedure Laterality Date   BUNIONECTOMY     05/2004, 2007   CATARACT EXTRACTION     04/2005, 06/2005   COLONOSCOPY W/ POLYPECTOMY     COLONOSCOPY WITH PROPOFOL N/A 02/15/2017   Procedure: COLONOSCOPY WITH PROPOFOL;  Surgeon: Jonathon Bellows, MD;  Location: [facility] ENDOSCOPY;  Service: Gastroenterology;  Laterality: N/A;   HYSTEROSCOPY  2011   ORIF WRIST FRACTURE Right 08/30/2015   Procedure: OPEN REDUCTION INTERNAL FIXATION (ORIF) RIGHT  WRIST FRACTURE AND REPAIR AS NECESSARY;  Surgeon: Roseanne Kaufman, MD;  Location: [place];  Service: Orthopedics;  Laterality: Right;   UPPER GI ENDOSCOPY     WRIST FRACTURE SURGERY Left    06/2006    Current Medications: Current Meds  Medication Sig   acetaminophen (TYLENOL) 500 MG tablet Take 500 mg by mouth every 6 (six) hours as needed.   Calcium Carbonate-Vit D-Min (CALCIUM 1200 PO) Take by mouth daily.    chlorpheniramine (CHLOR-TRIMETON) 4 MG tablet Take 4 mg by mouth as needed for allergies.   cholecalciferol (VITAMIN D3) 25 MCG (1000 UT) tablet Take 1,000 Units by mouth daily.   denosumab (PROLIA) 60 MG/ML SOSY injection Inject into the skin every 6 (six) months.   divalproex (DEPAKOTE ER) 500 MG 24 hr tablet Take 1 tablet (500 mg total) by mouth at bedtime.   hydrochlorothiazide (HYDRODIURIL) 25 MG tablet Take 1 tablet (25 mg total) by mouth daily.   losartan (COZAAR) 50 MG tablet TAKE 1 TABLET BY MOUTH EVERY DAY   polyethylene glycol powder (GLYCOLAX/MIRALAX) powder Take 1 Container by mouth as needed.    Propylene Glycol (SYSTANE BALANCE) 0.6 % SOLN Apply to eye as needed.   sodium chloride (OCEAN) 0.65 % SOLN nasal spray Place 1 spray into both nostrils as needed for congestion.   timolol (TIMOPTIC) 0.5 % ophthalmic solution Place 1 drop  into both eyes at bedtime.     Allergies:   Patient has no known allergies.   Social History   Socioeconomic History   Marital status: Divorced    Spouse name: Not on file   Number of children: 2   Years of education: college   Highest education level: Master's degree (e.g., MA, MS, MEng, MEd, MSW, MBA)  Occupational History   Occupation: retired  Tobacco Use   Smoking status: Former   Smokeless tobacco: Never   Tobacco comments:    (08/29/15) quit 40 years ago  Vaping Use   Vaping Use: Never used  Substance and Sexual Activity   Alcohol use: Yes    Alcohol/week: 0.0 standard drinks    Comment: occasional   Drug use: No   Sexual  activity: Not on file  Other Topics Concern   Not on file  Social History Narrative   Lives alone.   Right-handed.   Caffeine use: 1 cup in the morning and sometimes a decaf cup in the afternoon   Social Determinants of Health   Financial Resource Strain: Not on file  Food Insecurity: Not on file  Transportation Needs: Not on file  Physical Activity: Not on file  Stress: Not on file  Social Connections: Not on file     Family History: The patient's family history includes AVM in her daughter; Breast cancer in her paternal grandmother; Hodgkin's lymphoma in her sister; Lung cancer in her father; Rheum arthritis in her mother; Ulcers in her father.  ROS:   Please see the history of present illness.     All other systems reviewed and are negative.  EKGs/Labs/Other Studies Reviewed:    The following studies were reviewed today:   EKG:  EKG is  ordered today.  The ekg ordered today demonstrates normal sinus rhythm  Recent Labs: 12/01/2020: ALT 29; BUN 13; Creatinine, Ser 0.87; Potassium 5.2; Sodium 134  Recent Lipid Panel    Component Value Date/Time   CHOL 192 09/24/2019 1126   TRIG 83 09/24/2019 1126   HDL 67 09/24/2019 1126   CHOLHDL 2.8 09/20/2018 1510   CHOLHDL 2.4 01/14/2018 0605   VLDL 13 01/14/2018 0605   LDLCALC 110 (H) 09/24/2019 1126     Risk Assessment/Calculations:         Physical Exam:    VS:  BP 140/72 (BP Location: Right Arm, Patient Position: Sitting, Cuff Size: Normal)   Pulse 76   Ht '5\' 7"'$  (1.702 m)   Wt 121 lb 2 oz (54.9 kg)   SpO2 94%   BMI 18.97 kg/m     Wt Readings from Last 3 Encounters:  07/07/21 121 lb 2 oz (54.9 kg)  05/29/21 123 lb 6.4 oz (56 kg)  05/18/21 127 lb (57.6 kg)     GEN:  Well nourished, well developed in no acute distress HEENT: Normal NECK: No JVD; No carotid bruits CARDIAC: RRR, no murmurs, rubs, gallops RESPIRATORY:  Clear to auscultation without rales, wheezing or rhonchi  ABDOMEN: Soft, non-tender,  non-distended MUSCULOSKELETAL:  trace to 1+ edema; No deformity  SKIN: Warm and dry NEUROLOGIC:  Alert and oriented x 3 PSYCHIATRIC:  Normal affect   ASSESSMENT:    1. Leg edema   2. Primary hypertension    PLAN:    In order of problems listed above:  Leg edema, varicose veins of right leg.  Patient symptoms likely from venous insufficiency.  I agree with compression stockings and raising her legs.  We will obtain echocardiogram  to evaluate any gross structural abnormalities.  If echo is otherwise normal, recommend continuing conservative measures. Hypertension, BP reasonable for patient's age.  Continue with HCTZ, losartan.  Follow-up after echo      Medication Adjustments/Labs and Tests Ordered: Current medicines are reviewed at length with the patient today.  Concerns regarding medicines are outlined above.  Orders Placed This Encounter  Procedures   EKG 12-Lead   ECHOCARDIOGRAM COMPLETE   No orders of the defined types were placed in this encounter.   Patient Instructions  Medication Instructions:   Your physician recommends that you continue on your current medications as directed. Please refer to the Current Medication list given to you today.  *If you need a refill on your cardiac medications before your next appointment, please call your pharmacy*   Testing/Procedures:  Your physician has requested that you have an echocardiogram. Echocardiography is a painless test that uses sound waves to create images of your heart. It provides your doctor with information about the size and shape of your heart and how well your heart's chambers and valves are working. This procedure takes approximately one hour. There are no restrictions for this procedure.    Follow-Up: At [facility], you and your health needs are our priority.  As part of our continuing mission to provide you with exceptional heart care, we have created designated Provider Care Teams.  These Care Teams  include your primary Cardiologist (physician) and Advanced Practice Providers (APPs -  Physician Assistants and Nurse Practitioners) who all work together to provide you with the care you need, when you need it.  We recommend signing up for the patient portal called "MyChart".  Sign up information is provided on this After Visit Summary.  MyChart is used to connect with patients for Virtual Visits (Telemedicine).  Patients are able to view lab/test results, encounter notes, upcoming appointments, etc.  Non-urgent messages can be sent to your provider as well.   To learn more about what you can do with MyChart, go to [email or web address].    Your next appointment:   Follow up after testing   The format for your next appointment:   In Person  Provider:   You may see Kate Sable, MD or one of the following Advanced Practice Providers on your designated Care Team:   Murray Hodgkins, NP Christell Faith, PA-C Cadence Kathlen Mody, [place]    Other Instructions   Important Information About Sugar         Signed, Kate Sable, MD  07/07/2021 10:12 AM    [place]

## 2021-07-07 NOTE — Progress Notes (Signed)
Established patient visit  I,Joseline E Rosas,acting as a scribe for Gwyneth Sprout, FNP.,have documented all relevant documentation on the behalf of Gwyneth Sprout, FNP,as directed by  Gwyneth Sprout, FNP while in the presence of Gwyneth Sprout, FNP.   Patient: Courtney Willis   DOB: 02/14/1934   87 y.o. Female  MRN: [id number] Visit Date: 07/10/2021  Today's healthcare provider: Gwyneth Sprout, FNP  Re Introduced to nurse practitioner role and practice setting.  All questions answered.  Discussed provider/patient relationship and expectations.  Chief Complaint  Patient presents with   Follow-up   Subjective    HPI  Hypertension, follow-up  BP Readings from Last 3 Encounters:  07/10/21 (!) 147/78  07/07/21 140/72  05/29/21 129/71   Wt Readings from Last 3 Encounters:  07/10/21 121 lb 6.4 oz (55.1 kg)  07/07/21 121 lb 2 oz (54.9 kg)  05/29/21 123 lb 6.4 oz (56 kg)     She was last seen for hypertension 1 months ago.  BP at that visit was 157/89. Management since that visit includes Continue medication, Losartan 50 mg; add HCTZ 25 mg for edema.  She reports excellent compliance with treatment. She is not having side effects.  She is following a Regular diet. She does not smoke.   Outside blood pressures are not being checked. Symptoms: No chest pain No chest pressure  No palpitations No syncope  No dyspnea No orthopnea  No paroxysmal nocturnal dyspnea Yes lower extremity edema-some   Pertinent labs Lab Results  Component Value Date   CHOL 192 09/24/2019   HDL 67 09/24/2019   LDLCALC 110 (H) 09/24/2019   TRIG 83 09/24/2019   CHOLHDL 2.8 09/20/2018   Lab Results  Component Value Date   NA 134 12/01/2020   K 5.2 12/01/2020   CREATININE 0.87 12/01/2020   EGFR 66 12/01/2020   GLUCOSE 89 12/01/2020   TSH 3.330 04/21/2020     The ASCVD Risk score (Arnett DK, et al., 2019) failed to calculate for the following reasons:   The 2019 ASCVD risk score is only valid for  ages 22 to 27  ---------------------------------------------------------------------------------------------------   Follow up for edema  The patient was last seen for this 1 months ago. Changes made at last visit include using compression hose, low sodium diet and adequate fluid intake.Recommend use of HCTZ 25 mg  She reports excellent compliance with treatment. She feels that condition is Improved. She is not having side effects.   -----------------------------------------------------------------------------------------   Medications: Outpatient Medications Prior to Visit  Medication Sig   acetaminophen (TYLENOL) 500 MG tablet Take 500 mg by mouth every 6 (six) hours as needed.   Calcium Carbonate-Vit D-Min (CALCIUM 1200 PO) Take by mouth daily.    chlorpheniramine (CHLOR-TRIMETON) 4 MG tablet Take 4 mg by mouth as needed for allergies.   cholecalciferol (VITAMIN D3) 25 MCG (1000 UT) tablet Take 1,000 Units by mouth daily.   denosumab (PROLIA) 60 MG/ML SOSY injection Inject into the skin every 6 (six) months.   divalproex (DEPAKOTE ER) 500 MG 24 hr tablet Take 1 tablet (500 mg total) by mouth at bedtime.   hydrochlorothiazide (HYDRODIURIL) 25 MG tablet Take 1 tablet (25 mg total) by mouth daily.   losartan (COZAAR) 50 MG tablet TAKE 1 TABLET BY MOUTH EVERY DAY   polyethylene glycol powder (GLYCOLAX/MIRALAX) powder Take 1 Container by mouth as needed.    Propylene Glycol (SYSTANE BALANCE) 0.6 % SOLN Apply to eye as  needed.   sodium chloride (OCEAN) 0.65 % SOLN nasal spray Place 1 spray into both nostrils as needed for congestion.   timolol (TIMOPTIC) 0.5 % ophthalmic solution Place 1 drop into both eyes at bedtime.   No facility-administered medications prior to visit.    Review of Systems  Last CBC Lab Results  Component Value Date   WBC 6.6 04/21/2020   HGB 12.0 04/21/2020   HCT 34.8 04/21/2020   MCV 91 04/21/2020   MCH 31.4 04/21/2020   RDW 11.8 04/21/2020   PLT 271  76/22/6333   Last metabolic panel Lab Results  Component Value Date   GLUCOSE 89 12/01/2020   NA 134 12/01/2020   K 5.2 12/01/2020   CL 97 12/01/2020   CO2 25 12/01/2020   BUN 13 12/01/2020   CREATININE 0.87 12/01/2020   EGFR 66 12/01/2020   CALCIUM 9.7 12/01/2020   PROT 6.1 12/01/2020   ALBUMIN 4.4 12/01/2020   LABGLOB 1.7 12/01/2020   AGRATIO 2.6 (H) 12/01/2020   BILITOT 0.7 12/01/2020   ALKPHOS 39 (L) 12/01/2020   AST 30 12/01/2020   ALT 29 12/01/2020   ANIONGAP 9 10/22/2019   Last lipids Lab Results  Component Value Date   CHOL 192 09/24/2019   HDL 67 09/24/2019   LDLCALC 110 (H) 09/24/2019   TRIG 83 09/24/2019   CHOLHDL 2.8 09/20/2018   Last hemoglobin A1c Lab Results  Component Value Date   HGBA1C 5.6 12/01/2020       Objective    BP (!) 147/78 (BP Location: Right Arm, Patient Position: Sitting, Cuff Size: Normal)   Pulse 72   Temp 98.4 F (36.9 C) (Oral)   Resp 16   Wt 121 lb 6.4 oz (55.1 kg)   BMI 19.01 kg/m  BP Readings from Last 3 Encounters:  07/10/21 (!) 147/78  07/07/21 140/72  05/29/21 129/71   Wt Readings from Last 3 Encounters:  07/10/21 121 lb 6.4 oz (55.1 kg)  07/07/21 121 lb 2 oz (54.9 kg)  05/29/21 123 lb 6.4 oz (56 kg)   SpO2 Readings from Last 3 Encounters:  07/07/21 94%  12/01/20 99%  08/28/20 100%      Physical Exam Vitals and nursing note reviewed.  Constitutional:      General: She is not in acute distress.    Appearance: Normal appearance. She is normal weight. She is not ill-appearing, toxic-appearing or diaphoretic.  HENT:     Head: Normocephalic and atraumatic.  Cardiovascular:     Rate and Rhythm: Normal rate and regular rhythm.     Pulses: Normal pulses.     Heart sounds: Normal heart sounds. No murmur heard.    No friction rub. No gallop.  Pulmonary:     Effort: Pulmonary effort is normal. No respiratory distress.     Breath sounds: Normal breath sounds. No stridor. No wheezing, rhonchi or rales.  Chest:      Chest wall: No tenderness.  Abdominal:     General: Bowel sounds are normal.     Palpations: Abdomen is soft.  Musculoskeletal:        General: No swelling, tenderness, deformity or signs of injury. Normal range of motion.     Right lower leg: Edema present.     Left lower leg: Edema present.       Feet:     Comments: Non pitting edema near toes; has resolved with HCTZ in ankles/calves  Skin:    General: Skin is warm and dry.  Capillary Refill: Capillary refill takes less than 2 seconds.     Coloration: Skin is not jaundiced or pale.     Findings: No bruising, erythema, lesion or rash.     Comments: Slight discoloration d/t venous insufficiency near toes   Neurological:     General: No focal deficit present.     Mental Status: She is alert and oriented to person, place, and time. Mental status is at baseline.     Cranial Nerves: No cranial nerve deficit.     Sensory: No sensory deficit.     Motor: No weakness.     Coordination: Coordination normal.  Psychiatric:        Mood and Affect: Mood normal.        Behavior: Behavior normal.        Thought Content: Thought content normal.        Judgment: Judgment normal.      No results found for any visits on 07/10/21.  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    Chronic, unknown condition Has appt with cardiology for repeat ECHO next month L[facility]AB No gross murmurs appreciated       Primary hypertension    Chronic, stable for age; however, standard goal is <140/<90 Denies CP Denies SOB/ DOE Denies low blood pressure/hypotension Denies vision changes No LE Edema noted on exam Continue medication, Losartan at 50 mg; HCTZ at 25 mg Keep appts with cardiology Seek emergent care if you develop chest pain or chest pressure       Venous insufficiency of both lower extremities    Chronic,  improved Puffiness/non-pitting edema remains on top of foot However, coloration is improved Normal pulses Normal sensation No skin breakdown  Continue to use TED hose; off nightly with daily assessment of skin integrity         Other   Edema of foot    Chronic, stable Continue HCTZ Continue DASH diet Continue elevation and monitor water in diet        Return in about 6 months (around 01/09/2022) for annual examination.      Vonna Kotyk, FNP, have reviewed all documentation for this visit. The documentation on 07/10/21 for the exam, diagnosis, procedures, and orders are all accurate and complete.    Gwyneth Sprout, [place] [phone] (phone) [phone] (fax)  [place]

## 2021-07-07 NOTE — Patient Instructions (Signed)
Medication Instructions:   Your physician recommends that you continue on your current medications as directed. Please refer to the Current Medication list given to you today.  *If you need a refill on your cardiac medications before your next appointment, please call your pharmacy*    Testing/Procedures:  Your physician has requested that you have an echocardiogram. Echocardiography is a painless test that uses sound waves to create images of your heart. It provides your doctor with information about the size and shape of your heart and how well your heart's chambers and valves are working. This procedure takes approximately one hour. There are no restrictions for this procedure.    Follow-Up: At [facility], you and your health needs are our priority.  As part of our continuing mission to provide you with exceptional heart care, we have created designated Provider Care Teams.  These Care Teams include your primary Cardiologist (physician) and Advanced Practice Providers (APPs -  Physician Assistants and Nurse Practitioners) who all work together to provide you with the care you need, when you need it.  We recommend signing up for the patient portal called "MyChart".  Sign up information is provided on this After Visit Summary.  MyChart is used to connect with patients for Virtual Visits (Telemedicine).  Patients are able to view lab/test results, encounter notes, upcoming appointments, etc.  Non-urgent messages can be sent to your provider as well.   To learn more about what you can do with MyChart, go to [email or web address].    Your next appointment:   Follow up after testing   The format for your next appointment:   In Person  Provider:   You may see Brian Agbor-Etang, MD or one of the following Advanced Practice Providers on your designated Care Team:   Christopher Berge, NP Ryan Dunn, PA-C Cadence Furth, PA-C    Other Instructions   Important Information About  Sugar       

## 2021-07-10 ENCOUNTER — Ambulatory Visit: Payer: Medicare PPO | Admitting: Family Medicine

## 2021-07-10 ENCOUNTER — Encounter: Payer: Self-pay | Admitting: Family Medicine

## 2021-07-10 VITALS — BP 147/78 | HR 72 | Temp 98.4°F | Resp 16 | Wt 121.4 lb

## 2021-07-10 DIAGNOSIS — I5041 Acute combined systolic (congestive) and diastolic (congestive) heart failure: Secondary | ICD-10-CM

## 2021-07-10 DIAGNOSIS — I872 Venous insufficiency (chronic) (peripheral): Secondary | ICD-10-CM

## 2021-07-10 DIAGNOSIS — R600 Localized edema: Secondary | ICD-10-CM

## 2021-07-10 DIAGNOSIS — I10 Essential (primary) hypertension: Secondary | ICD-10-CM

## 2021-07-10 NOTE — Assessment & Plan Note (Signed)
Chronic, stable for age; however, standard goal is <140/<90 Denies CP Denies SOB/ DOE Denies low blood pressure/hypotension Denies vision changes No LE Edema noted on exam Continue medication, Losartan at 50 mg; HCTZ at 25 mg Keep appts with cardiology Seek emergent care if you develop chest pain or chest pressure

## 2021-07-10 NOTE — Assessment & Plan Note (Signed)
Chronic, unknown condition Has appt with cardiology for repeat ECHO next month LCTAB No gross murmurs appreciated

## 2021-07-10 NOTE — Assessment & Plan Note (Signed)
Chronic, stable Continue HCTZ Continue DASH diet Continue elevation and monitor water in diet

## 2021-07-10 NOTE — Assessment & Plan Note (Signed)
Chronic, improved Puffiness/non-pitting edema remains on top of foot However, coloration is improved Normal pulses Normal sensation No skin breakdown  Continue to use TED hose; off nightly with daily assessment of skin integrity

## 2021-08-12 ENCOUNTER — Ambulatory Visit (INDEPENDENT_AMBULATORY_CARE_PROVIDER_SITE_OTHER): Payer: Medicare PPO

## 2021-08-12 DIAGNOSIS — R600 Localized edema: Secondary | ICD-10-CM | POA: Diagnosis not present

## 2021-08-12 LAB — ECHOCARDIOGRAM COMPLETE
AR max vel: 2.38 cm2
AV Area VTI: 2.58 cm2
AV Area mean vel: 2.69 cm2
AV Mean grad: 5 mmHg
AV Peak grad: 9.7 mmHg
AV Vena cont: 0.3 cm
Ao pk vel: 1.56 m/s
Area-P 1/2: 2.62 cm2
Calc EF: 57.9 %
S' Lateral: 2.5 cm
Single Plane A2C EF: 53.9 %
Single Plane A4C EF: 60.2 %

## 2021-09-04 ENCOUNTER — Ambulatory Visit: Payer: Medicare PPO | Admitting: Cardiology

## 2021-09-04 ENCOUNTER — Encounter: Payer: Self-pay | Admitting: Cardiology

## 2021-09-04 VITALS — BP 122/72 | HR 84 | Ht 67.0 in | Wt 119.0 lb

## 2021-09-04 DIAGNOSIS — R600 Localized edema: Secondary | ICD-10-CM | POA: Diagnosis not present

## 2021-09-04 DIAGNOSIS — I10 Essential (primary) hypertension: Secondary | ICD-10-CM | POA: Diagnosis not present

## 2021-09-04 NOTE — Patient Instructions (Signed)
Medication Instructions:   Your physician recommends that you continue on your current medications as directed. Please refer to the Current Medication list given to you today.  *If you need a refill on your cardiac medications before your next appointment, please call your pharmacy*  Follow-Up: At [facility], you and your health needs are our priority.  As part of our continuing mission to provide you with exceptional heart care, we have created designated Provider Care Teams.  These Care Teams include your primary Cardiologist (physician) and Advanced Practice Providers (APPs -  Physician Assistants and Nurse Practitioners) who all work together to provide you with the care you need, when you need it.  We recommend signing up for the patient portal called "MyChart".  Sign up information is provided on this After Visit Summary.  MyChart is used to connect with patients for Virtual Visits (Telemedicine).  Patients are able to view lab/test results, encounter notes, upcoming appointments, etc.  Non-urgent messages can be sent to your provider as well.   To learn more about what you can do with MyChart, go to [email or web address].    Your next appointment:   Follow up as needed   The format for your next appointment:     Provider:       Other Instructions   Important Information About Sugar

## 2021-09-04 NOTE — Progress Notes (Signed)
Cardiology Office Note:    Date:  09/04/2021   ID:  Courtney Willis, DOB 1934/03/07, MRN [id number]  PCP:  Courtney Willis, [facility] Providers Cardiologist:  Courtney Sable, MD     Referring MD: Courtney Sprout, FNP   Chief Complaint  Patient presents with   Follow-up    F/U after echo    History of Present Illness:    Courtney Willis is a 87 y.o. female with a hx of hypertension, hemorrhagic CVA 2019 who presents for follow-up.  Previously seen due to leg edema.  She has a history of varicose veins, venous insufficiency edema possible etiology.  Echocardiogram was obtained to evaluate any cardiac causes.  She feels well, overall symptoms have improved with using compression stockings and keeping her legs raised.  Presents for echocardiogram results.   Echocardiogram 2019 EF 65%.  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   Post menopausal syndrome 08/12/2014   Stroke [facility])     Past Surgical History:  Procedure Laterality Date   BUNIONECTOMY     05/2004, 2007   CATARACT EXTRACTION     04/2005, 06/2005   COLONOSCOPY W/ POLYPECTOMY     COLONOSCOPY WITH PROPOFOL N/A 02/15/2017   Procedure: COLONOSCOPY WITH PROPOFOL;  Surgeon: Courtney Bellows, MD;  Location: [facility] ENDOSCOPY;  Service: Gastroenterology;  Laterality: N/A;   HYSTEROSCOPY  2011   ORIF WRIST FRACTURE Right 08/30/2015   Procedure: OPEN REDUCTION INTERNAL FIXATION (ORIF) RIGHT WRIST FRACTURE AND REPAIR AS NECESSARY;  Surgeon: Courtney Kaufman, MD;  Location: [place];  Service: Orthopedics;  Laterality: Right;    UPPER GI ENDOSCOPY     WRIST FRACTURE SURGERY Left    06/2006    Current Medications: Current Meds  Medication Sig   acetaminophen (TYLENOL) 500 MG tablet Take 500 mg by mouth every 6 (six) hours as needed.   Calcium Carbonate-Vit D-Min (CALCIUM 1200 PO) Take by mouth daily.    chlorpheniramine (CHLOR-TRIMETON) 4 MG tablet Take 4 mg by mouth as needed for allergies.   cholecalciferol (VITAMIN D3) 25 MCG (1000 UT) tablet Take 1,000 Units by mouth daily.   denosumab (PROLIA) 60 MG/ML SOSY injection Inject into the skin every 6 (six) months.   divalproex (DEPAKOTE ER) 500 MG 24 hr tablet Take 1 tablet (500 mg total) by mouth at bedtime.   hydrochlorothiazide (HYDRODIURIL) 25 MG tablet Take 1 tablet (25 mg total) by mouth daily.   losartan (COZAAR) 50 MG tablet TAKE 1 TABLET BY MOUTH EVERY DAY   polyethylene glycol powder (GLYCOLAX/MIRALAX) powder Take 1 Container by mouth as needed.    Propylene Glycol (SYSTANE BALANCE) 0.6 % SOLN Apply to eye as needed.   sodium chloride (OCEAN) 0.65 % SOLN nasal spray Place 1 spray into both nostrils as needed for congestion.   timolol (TIMOPTIC) 0.5 % ophthalmic solution Place 1 drop into both eyes at bedtime.     Allergies:   Patient has no known allergies.   Social History   Socioeconomic History  Marital status: Divorced    Spouse name: Not on file   Number of children: 2   Years of education: college   Highest education level: Master's degree (e.g., MA, MS, MEng, MEd, MSW, MBA)  Occupational History   Occupation: retired  Tobacco Use   Smoking status: Former   Smokeless tobacco: Never   Tobacco comments:    (08/29/15) quit 40 years ago  Vaping Use   Vaping Use: Never used  Substance and Sexual Activity   Alcohol use: Yes    Alcohol/week: 0.0 standard drinks of alcohol    Comment: occasional   Drug use: No   Sexual activity: Not on file  Other Topics Concern   Not on file  Social History Narrative   Lives alone.   Right-handed.    Caffeine use: 1 cup in the morning and sometimes a decaf cup in the afternoon   Social Determinants of Health   Financial Resource Strain: Low Risk  (07/13/2017)   Overall Financial Resource Strain (CARDIA)    Difficulty of Paying Living Expenses: Not hard at all  Food Insecurity: No Food Insecurity (07/13/2017)   Hunger Vital Sign    Worried About Running Out of Food in the Last Year: Never true    Ran Out of Food in the Last Year: Never true  Transportation Needs: No Transportation Needs (07/13/2017)   PRAPARE - Hydrologist (Medical): No    Lack of Transportation (Non-Medical): No  Physical Activity: Inactive (07/25/2018)   Exercise Vital Sign    Days of Exercise per Week: 0 days    Minutes of Exercise per Session: 0 min  Stress: No Stress Concern Present (07/25/2018)   [place]    Feeling of Stress : Only a little  Social Connections: Unknown (07/25/2018)   Social Connection and Isolation Panel [NHANES]    Frequency of Communication with Friends and Family: Patient refused    Frequency of Social Gatherings with Friends and Family: Patient refused    Attends Religious Services: Patient refused    Marine scientist or Organizations: Patient refused    Attends Music therapist: Patient refused    Marital Status: Patient refused     Family History: The patient's family history includes AVM in her daughter; Breast cancer in her paternal grandmother; Hodgkin's lymphoma in her sister; Lung cancer in her father; Rheum arthritis in her mother; Ulcers in her father.  ROS:   Please see the history of present illness.     All other systems reviewed and are negative.  EKGs/Labs/Other Studies Reviewed:    The following studies were reviewed today:   EKG:  EKG not ordered today.   Recent Labs: 12/01/2020: ALT 29; BUN 13; Creatinine, Ser 0.87; Potassium 5.2; Sodium 134   Recent Lipid Panel    Component Value Date/Time   CHOL 192 09/24/2019 1126   TRIG 83 09/24/2019 1126   HDL 67 09/24/2019 1126   CHOLHDL 2.8 09/20/2018 1510   CHOLHDL 2.4 01/14/2018 0605   VLDL 13 01/14/2018 0605   LDLCALC 110 (H) 09/24/2019 1126     Risk Assessment/Calculations:         Physical Exam:    VS:  BP 122/72 (BP Location: Left Arm, Patient Position: Sitting, Cuff Size: Normal)   Pulse 84   Ht '5\' 7"'$  (1.702 m)   Wt 119 lb (54 kg)   SpO2 99%   BMI 18.64 kg/m  Wt Readings from Last 3 Encounters:  09/04/21 119 lb (54 kg)  07/10/21 121 lb 6.4 oz (55.1 kg)  07/07/21 121 lb 2 oz (54.9 kg)     GEN:  Well nourished, well developed in no acute distress HEENT: Normal NECK: No JVD; No carotid bruits CARDIAC: RRR, no murmurs, rubs, gallops RESPIRATORY:  Clear to auscultation without rales, wheezing or rhonchi  ABDOMEN: Soft, non-tender, non-distended MUSCULOSKELETAL:  trace to 1+ edema; No deformity  SKIN: Warm and dry NEUROLOGIC:  Alert and oriented x 3 PSYCHIATRIC:  Normal affect   ASSESSMENT:    1. Leg edema   2. Primary hypertension    PLAN:    In order of problems listed above:  Leg edema, varicose veins of right leg.  Patient symptoms likely from venous insufficiency.  Echocardiogram shows normal EF 60 to 65%, impaired relaxation, no gross structural abnormalities to suggest etiology of edema.  Recommend continuing conservative measures including raising legs when in seated position, compression stockings.. Hypertension, BP controlled.  Continue with HCTZ, losartan.  Follow-up as needed      Medication Adjustments/Labs and Tests Ordered: Current medicines are reviewed at length with the patient today.  Concerns regarding medicines are outlined above.  No orders of the defined types were placed in this encounter.  No orders of the defined types were placed in this encounter.   Patient Instructions  Medication Instructions:   Your physician  recommends that you continue on your current medications as directed. Please refer to the Current Medication list given to you today.  *If you need a refill on your cardiac medications before your next appointment, please call your pharmacy*  Follow-Up: At [facility], you and your health needs are our priority.  As part of our continuing mission to provide you with exceptional heart care, we have created designated Provider Care Teams.  These Care Teams include your primary Cardiologist (physician) and Advanced Practice Providers (APPs -  Physician Assistants and Nurse Practitioners) who all work together to provide you with the care you need, when you need it.  We recommend signing up for the patient portal called "MyChart".  Sign up information is provided on this After Visit Summary.  MyChart is used to connect with patients for Virtual Visits (Telemedicine).  Patients are able to view lab/test results, encounter notes, upcoming appointments, etc.  Non-urgent messages can be sent to your provider as well.   To learn more about what you can do with MyChart, go to [email or web address].    Your next appointment:   Follow up as needed   The format for your next appointment:     Provider:       Other Instructions   Important Information About Sugar         Signed, Courtney Sable, MD  09/04/2021 2:54 PM    [place]

## 2021-10-13 DIAGNOSIS — M810 Age-related osteoporosis without current pathological fracture: Secondary | ICD-10-CM | POA: Diagnosis not present

## 2021-10-13 DIAGNOSIS — H40053 Ocular hypertension, bilateral: Secondary | ICD-10-CM | POA: Diagnosis not present

## 2021-11-11 ENCOUNTER — Ambulatory Visit: Payer: Self-pay | Admitting: *Deleted

## 2021-11-11 NOTE — Telephone Encounter (Signed)
  Chief Complaint: out of character Symptoms: hateful Frequency: 2 weeks Pertinent Negatives: Patient denies na Disposition: '[]'$ ED /'[]'$ Urgent Care (no appt availability in office) / '[]'$ Appointment(In office/virtual)/ '[]'$  [facility]/ '[]'$ Home Care/ '[]'$ Refused Recommended Disposition /'[]'$ [place] [facility]/ '[x]'$  Follow-up with PCP Additional Notes: I originally made appt but daughter decided she is going to go see her mother and see if she can narrow down what might be going on and she may go on to UC, if not will call back for appt.   Reason for Disposition  [1] Longstanding confusion (e.g., dementia, stroke) AND [2] NO worsening or change  Answer Assessment - Initial Assessment Questions 1. SYMPTOM: "What is the main symptom you are concerned about?" (e.g., weakness, numbness)     Confusion, out of character 2. ONSET: "When did this start?" (minutes, hours, days; while sleeping)     Gotten worse this week 3. LAST NORMAL: "When was the last time you (the patient) were normal (no symptoms)?"     Last few weeks is different 4. PATTERN "Does this come and go, or has it been constant since it started?"  "Is it present now?"     constant 5. CARDIAC SYMPTOMS: "Have you had any of the following symptoms: chest pain, difficulty breathing, palpitations?"     No complaints 6. NEUROLOGIC SYMPTOMS: "Have you had any of the following symptoms: headache, dizziness, vision loss, double vision, changes in speech, unsteady on your feet?"     unsteady 7. OTHER SYMPTOMS: "Do you have any other symptoms?"     Personality changing 8. PREGNANCY: "Is there any chance you are pregnant?" "When was your last menstrual period?"     no  Answer Assessment - Initial Assessment Questions 1. LEVEL OF CONSCIOUSNESS: "How is he (she, the patient) acting right now?" (e.g., alert-oriented, confused, lethargic, stuporous, comatose)     Out of character, acting rude 2. ONSET: "When did the confusion start?"   (minutes, hours, days)     2 weeks ago 3. PATTERN "Does this come and go, or has it been constant since it started?"  "Is it present now?"     no 4. ALCOHOL or DRUGS: "Has he been drinking alcohol or taking any drugs?"      no 5. NARCOTIC MEDICINES: "Has he been receiving any narcotic medications?" (e.g., morphine, Vicodin)     no 6. CAUSE: "What do you think is causing the confusion?"      One time had a stroke and acted like this, also had a UTI and acted like this 7. OTHER SYMPTOMS: "Are there any other symptoms?" (e.g., difficulty breathing, headache, fever, weakness)     no  Protocols used: Neurologic Deficit-A-AH, Confusion - Delirium-A-AH

## 2021-11-12 ENCOUNTER — Telehealth: Payer: Self-pay

## 2021-11-12 NOTE — Telephone Encounter (Signed)
Copied from [place] [id number]. Topic: General - Other >> Nov 11, 2021  3:03 PM Everette C wrote: Reason for CRM: Courtney Willis with [facility] has called to follow up on previously submitted documents to the practice related to the patient's recent [place] scan and OT exam have been added to their chart  Please contact Courtney Willis when possible to confirm receipt

## 2021-11-13 ENCOUNTER — Emergency Department: Payer: Medicare PPO

## 2021-11-13 ENCOUNTER — Encounter: Payer: Self-pay | Admitting: Emergency Medicine

## 2021-11-13 ENCOUNTER — Other Ambulatory Visit: Payer: Self-pay

## 2021-11-13 ENCOUNTER — Emergency Department
Admission: EM | Admit: 2021-11-13 | Discharge: 2021-11-14 | Discharge status: Against Medical Advice | Payer: Medicare PPO | Attending: Emergency Medicine | Admitting: Emergency Medicine

## 2021-11-13 DIAGNOSIS — R410 Disorientation, unspecified: Secondary | ICD-10-CM | POA: Diagnosis not present

## 2021-11-13 DIAGNOSIS — R4182 Altered mental status, unspecified: Secondary | ICD-10-CM | POA: Diagnosis not present

## 2021-11-13 DIAGNOSIS — Z5321 Procedure and treatment not carried out due to patient leaving prior to being seen by health care provider: Secondary | ICD-10-CM | POA: Diagnosis not present

## 2021-11-13 DIAGNOSIS — R531 Weakness: Secondary | ICD-10-CM | POA: Diagnosis not present

## 2021-11-13 DIAGNOSIS — J439 Emphysema, unspecified: Secondary | ICD-10-CM | POA: Diagnosis not present

## 2021-11-13 LAB — CBC WITH DIFFERENTIAL/PLATELET
Abs Immature Granulocytes: 0.03 10*3/uL (ref 0.00–0.07)
Basophils Absolute: 0 10*3/uL (ref 0.0–0.1)
Basophils Relative: 0 %
Eosinophils Absolute: 0 10*3/uL (ref 0.0–0.5)
Eosinophils Relative: 0 %
HCT: 35.3 % — ABNORMAL LOW (ref 36.0–46.0)
Hemoglobin: 11.6 g/dL — ABNORMAL LOW (ref 12.0–15.0)
Immature Granulocytes: 1 %
Lymphocytes Relative: 17 %
Lymphs Abs: 0.9 10*3/uL (ref 0.7–4.0)
MCH: 31.7 pg (ref 26.0–34.0)
MCHC: 32.9 g/dL (ref 30.0–36.0)
MCV: 96.4 fL (ref 80.0–100.0)
Monocytes Absolute: 0.5 10*3/uL (ref 0.1–1.0)
Monocytes Relative: 10 %
Neutro Abs: 4.1 10*3/uL (ref 1.7–7.7)
Neutrophils Relative %: 72 %
Platelets: 286 10*3/uL (ref 150–400)
RBC: 3.66 MIL/uL — ABNORMAL LOW (ref 3.87–5.11)
RDW: 12.5 % (ref 11.5–15.5)
WBC: 5.6 10*3/uL (ref 4.0–10.5)
nRBC: 0 % (ref 0.0–0.2)

## 2021-11-13 LAB — BASIC METABOLIC PANEL
Anion gap: 9 (ref 5–15)
BUN: 17 mg/dL (ref 8–23)
CO2: 27 mmol/L (ref 22–32)
Calcium: 9 mg/dL (ref 8.9–10.3)
Chloride: 96 mmol/L — ABNORMAL LOW (ref 98–111)
Creatinine, Ser: 0.75 mg/dL (ref 0.44–1.00)
GFR, Estimated: >60 mL/min (ref >60)
Glucose, Bld: 105 mg/dL — ABNORMAL HIGH (ref 70–99)
Potassium: 4 mmol/L (ref 3.5–5.1)
Sodium: 132 mmol/L — ABNORMAL LOW (ref 135–145)

## 2021-11-13 LAB — CBG MONITORING, ED: Glucose-Capillary: 92 mg/dL (ref 70–99)

## 2021-11-13 NOTE — ED Notes (Signed)
Called pt several times to take pt to a room no answer. RN walked around waiting room area calling pt's name no answer

## 2021-11-13 NOTE — ED Triage Notes (Signed)
Pt to ED via POV with her daughter, pt daughter states that pt has had increasing confusion over the past week. Daughter reports that the about 5 years ago pt had a large stroke and has had a gradual decline since then but it has seen to have gotten worse over the week. Pt denies increased urination, painful urination, or foul smell to her urine. Pt daughter states that they are having to explain things over and over before the patient is able to understand and the patient is not able to function at the level of independence that she normally does. Pt is currently in NAD.

## 2021-11-13 NOTE — ED Provider Triage Note (Signed)
  Emergency Medicine Provider Triage Evaluation Note  SHEFALI NG , a 87 y.o.female,  was evaluated in triage.  Pt complains of altered mental status.  She is joined by her daughter, who states that the patient has been increasingly more confused over the past week and has been unable to answer many questions accurately.  Daughter states that they are having to explain things to her over and over again patient does not appear to understand the way she used to.   Review of Systems  Positive: Altered mental status Negative: Denies fever, chest pain, vomiting  Physical Exam   Vitals:   11/13/21 1647  BP: 131/78  Pulse: 82  Resp: 16  Temp: 98.2 F (36.8 C)  SpO2: 100%   Gen:   Awake, no distress   Resp:  Normal effort  MSK:   Moves extremities without difficulty  Other:    Medical Decision Making  Given the patient's initial medical screening exam, the following diagnostic evaluation has been ordered. The patient will be placed in the appropriate treatment space, once one is available, to complete the evaluation and treatment. I have discussed the plan of care with the patient and I have advised the patient that an ED physician or mid-level practitioner will reevaluate their condition after the test results have been received, as the results may give them additional insight into the type of treatment they may need.    Diagnostics: Labs, head CT, UA, CXR, EKG  Treatments: none immediately   Teodoro Spray, [place] 11/13/21 1652

## 2021-11-16 ENCOUNTER — Ambulatory Visit: Payer: Medicare PPO | Admitting: Family Medicine

## 2021-11-16 ENCOUNTER — Other Ambulatory Visit: Payer: Self-pay | Admitting: Family Medicine

## 2021-11-16 DIAGNOSIS — I10 Essential (primary) hypertension: Secondary | ICD-10-CM

## 2021-11-16 DIAGNOSIS — I5041 Acute combined systolic (congestive) and diastolic (congestive) heart failure: Secondary | ICD-10-CM

## 2021-11-16 DIAGNOSIS — I872 Venous insufficiency (chronic) (peripheral): Secondary | ICD-10-CM

## 2021-11-16 NOTE — Telephone Encounter (Signed)
Its in her chart :)

## 2021-11-18 NOTE — Progress Notes (Unsigned)
Established patient visit   Patient: Courtney Willis   DOB: 03-19-1934   87 y.o. Female  MRN: [id number] Visit Date: 11/19/2021  Today's healthcare provider: Gwyneth Sprout, FNP  Introduced to nurse practitioner role and practice setting.  All questions answered.  Discussed provider/patient relationship and expectations.  I,Evonne Rinks J Stock[place] Nunley,acting as a scribe for Gwyneth Sprout, FNP.,have documented all relevant documentation on the behalf of Gwyneth Sprout, FNP,as directed by  Gwyneth Sprout, FNP while in the presence of Gwyneth Sprout, FNP.   Chief Complaint  Patient presents with   Memory Loss    Patient's daughter has had more issues with memory, worsening in the past couple weeks.   Subjective    HPI HPI     Memory Loss    Additional comments: Patient's daughter has had more issues with memory, worsening in the past couple weeks.      Last edited by Smitty Knudsen, CMA on 11/19/2021  1:11 PM.      Medications: Outpatient Medications Prior to Visit  Medication Sig   acetaminophen (TYLENOL) 500 MG tablet Take 500 mg by mouth every 6 (six) hours as needed.   Calcium Carbonate-Vit D-Min (CALCIUM 1200 PO) Take by mouth daily.    chlorpheniramine (CHLOR-TRIMETON) 4 MG tablet Take 4 mg by mouth as needed for allergies.   cholecalciferol (VITAMIN D3) 25 MCG (1000 UT) tablet Take 1,000 Units by mouth daily.   denosumab (PROLIA) 60 MG/ML SOSY injection Inject into the skin every 6 (six) months.   divalproex (DEPAKOTE ER) 500 MG 24 hr tablet Take 1 tablet (500 mg total) by mouth at bedtime.   hydrochlorothiazide (HYDRODIURIL) 25 MG tablet TAKE 1 TABLET (25 MG TOTAL) BY MOUTH DAILY.   losartan (COZAAR) 50 MG tablet TAKE 1 TABLET BY MOUTH EVERY DAY   polyethylene glycol powder (GLYCOLAX/MIRALAX) powder Take 1 Container by mouth as needed.    Propylene Glycol (SYSTANE BALANCE) 0.6 % SOLN Apply to eye as needed.   sodium chloride (OCEAN) 0.65 % SOLN nasal spray Place 1 spray into both  nostrils as needed for congestion.   timolol (TIMOPTIC) 0.5 % ophthalmic solution Place 1 drop into both eyes at bedtime.   No facility-administered medications prior to visit.    Review of Systems   Objective    BP 117/69 (BP Location: Right Arm, Patient Position: Sitting, Cuff Size: Normal)   Pulse 91   Temp 98.3 F (36.8 C) (Oral)   Resp 16   Ht '5\' 7"'$  (1.702 m)   Wt 118 lb (53.5 kg)   SpO2 99%   BMI 18.48 kg/m   Physical Exam Vitals and nursing note reviewed.  Constitutional:      General: She is not in acute distress.    Appearance: Normal appearance. She is underweight. She is not ill-appearing, toxic-appearing or diaphoretic.  HENT:     Head: Normocephalic and atraumatic.  Cardiovascular:     Rate and Rhythm: Normal rate and regular rhythm.     Pulses: Normal pulses.     Heart sounds: Normal heart sounds. No murmur heard.    No friction rub. No gallop.  Pulmonary:     Effort: Pulmonary effort is normal. No respiratory distress.     Breath sounds: Normal breath sounds. No stridor. No wheezing, rhonchi or rales.  Chest:     Chest wall: No tenderness.  Abdominal:     General: Bowel sounds are normal.     Palpations:  Abdomen is soft.  Musculoskeletal:        General: No swelling, tenderness, deformity or signs of injury. Normal range of motion.     Right lower leg: No edema.     Left lower leg: No edema.  Skin:    General: Skin is warm and dry.     Capillary Refill: Capillary refill takes less than 2 seconds.     Coloration: Skin is not jaundiced or pale.     Findings: No bruising, erythema, lesion or rash.  Neurological:     General: No focal deficit present.     Mental Status: She is alert and oriented to person, place, and time. Mental status is at baseline.     Cranial Nerves: No cranial nerve deficit.     Sensory: No sensory deficit.     Motor: No weakness.     Coordination: Coordination normal.  Psychiatric:        Mood and Affect: Mood normal.         Speech: Speech is delayed and tangential.        Behavior: Behavior normal.        Thought Content: Thought content normal.        Cognition and Memory: Cognition is impaired. Memory is impaired. She exhibits impaired recent memory and impaired remote memory.        Judgment: Judgment is impulsive. Judgment is not inappropriate.     Comments: Reports leaving car unattended in the middle of the road, taking a ride from a stranger- to get to hair appt; also circles her home at night for 1 hour before bed    Results for orders placed or performed in visit on 11/19/21  POCT Urinalysis Dipstick  Result Value Ref Range   Color, UA yellow    Clarity, UA clear    Glucose, UA Negative Negative   Bilirubin, UA negative    Ketones, UA negative    Spec Grav, UA 1.010 1.010 - 1.025   Blood, UA negative    pH, UA 7.0 5.0 - 8.0   Protein, UA Negative Negative   Urobilinogen, UA 0.2 0.2 or 1.0 E.U./dL   Nitrite, UA negative    Leukocytes, UA Small (1+) (A) Negative   Appearance     Odor      Assessment & Plan     Problem List Items Addressed This Visit       Nervous and Auditory   Confusion - Primary    Acute; worsening; noted to have MCI for 6+ months; previously seen at neuro Reports acute change within last 1 month No longer is driving meds are now being provided for patient with HHA Meals are also being provided Pt notes some anxiety if routines are altered; also circles her home at night within [facility] for 1 hour before bed Poor decision making including leaving car in road and taking a ride from stranger prior to removal of keys/vehicle by family Refer back to neuro UA+ for leuks; will send for Cx Seen at ED and CT, CXR and CMP/CBC were all stable; pt was unable to void at that time for UTI screening      Relevant Orders   POCT Urinalysis Dipstick (Completed)   Ambulatory referral to Neurology   Urine Culture   Other Visit Diagnoses     Need for influenza vaccination        Relevant Orders   Flu Vaccine QUAD High Dose(Fluad) (Completed)      Return  if symptoms worsen or fail to improve.      Vonna Kotyk, FNP, have reviewed all documentation for this visit. The documentation on 11/19/21 for the exam, diagnosis, procedures, and orders are all accurate and complete. Gwyneth Sprout, [place] [phone] (phone) [phone] (fax)  [place]

## 2021-11-19 ENCOUNTER — Ambulatory Visit: Payer: Medicare PPO | Admitting: Family Medicine

## 2021-11-19 ENCOUNTER — Encounter: Payer: Self-pay | Admitting: Family Medicine

## 2021-11-19 ENCOUNTER — Other Ambulatory Visit: Payer: Self-pay | Admitting: Family Medicine

## 2021-11-19 VITALS — BP 117/69 | HR 91 | Temp 98.3°F | Resp 16 | Ht 67.0 in | Wt 118.0 lb

## 2021-11-19 DIAGNOSIS — I872 Venous insufficiency (chronic) (peripheral): Secondary | ICD-10-CM

## 2021-11-19 DIAGNOSIS — R410 Disorientation, unspecified: Secondary | ICD-10-CM | POA: Diagnosis not present

## 2021-11-19 DIAGNOSIS — Z23 Encounter for immunization: Secondary | ICD-10-CM | POA: Diagnosis not present

## 2021-11-19 DIAGNOSIS — I10 Essential (primary) hypertension: Secondary | ICD-10-CM

## 2021-11-19 DIAGNOSIS — I5041 Acute combined systolic (congestive) and diastolic (congestive) heart failure: Secondary | ICD-10-CM

## 2021-11-19 LAB — POCT URINALYSIS DIPSTICK
Bilirubin, UA: NEGATIVE
Blood, UA: NEGATIVE
Glucose, UA: NEGATIVE
Ketones, UA: NEGATIVE
Nitrite, UA: NEGATIVE
Protein, UA: NEGATIVE
Spec Grav, UA: 1.01 (ref 1.010–1.025)
Urobilinogen, UA: 0.2 E.U./dL
pH, UA: 7 (ref 5.0–8.0)

## 2021-11-19 MED ORDER — LOSARTAN POTASSIUM 50 MG PO TABS: 50.0000 mg | ORAL_TABLET | Freq: Every day | ORAL | 11 refills | Status: AC

## 2021-11-19 MED ORDER — HYDROCHLOROTHIAZIDE 25 MG PO TABS: 25.0000 mg | ORAL_TABLET | Freq: Every day | ORAL | 11 refills | Status: DC

## 2021-11-19 NOTE — Assessment & Plan Note (Signed)
Acute; worsening; noted to have MCI for 6+ months; previously seen at neuro Reports acute change within last 1 month No longer is driving meds are now being provided for patient with [facility] Meals are also being provided Pt notes some anxiety if routines are altered; also circles her home at night within [facility] for 1 hour before bed Poor decision making including leaving car in road and taking a ride from stranger prior to removal of keys/vehicle by family Refer back to neuro UA+ for leuks; will send for Cx Seen at ED and CT, CXR and CMP/CBC were all stable; pt was unable to void at that time for UTI screening

## 2021-11-20 ENCOUNTER — Telehealth: Payer: Self-pay

## 2021-11-20 NOTE — Telephone Encounter (Signed)
        Patient  visited [place] on 10/14    Telephone encounter attempt :  1st  A HIPAA compliant voice message was left requesting a return call.  Instructed patient to call back    [place], [place] [facility]  [phone] [address], [place], [place] [address] Phone: [phone] Email: [email or web address]

## 2021-11-21 LAB — URINE CULTURE

## 2021-11-22 NOTE — Progress Notes (Signed)
Urine Cx negative; continue to follow up with neurology.  Gwyneth Sprout, [address] [place], [place] [address] [phone] (phone) [phone] (fax) [place]

## 2021-12-02 ENCOUNTER — Telehealth: Payer: Self-pay | Admitting: Family Medicine

## 2021-12-02 NOTE — Telephone Encounter (Signed)
Copied from [place] [phone]. Topic: Medicare AWV >> Dec 02, 2021 11:11 AM Jae Dire wrote: Reason for CRM:  Left message for patient to call back and schedule Medicare Annual Wellness Visit (AWV) in office.   If unable to come into the office for AWV,  please offer to do virtually or by telephone.  Last AWV:  12/01/2020  Please schedule at anytime with [facility] Health Advisor.  30 minute appointment for Virtual or phone 45 minute appointment for in office or Initial virtual/phone  Any questions, please contact me at [phone]

## 2022-01-08 NOTE — Progress Notes (Deleted)
Complete physical exam   Patient: Courtney Willis   DOB: 17-Jul-1934   87 y.o. Female  MRN: [id number] Visit Date: 01/11/2022  Today's healthcare provider: Gwyneth Sprout, FNP   No chief complaint on file.  Subjective    Courtney Willis is a 87 y.o. female who presents today for a complete physical exam.  She reports consuming a {diet types:17450} diet. {Exercise:19826} She generally feels {well/fairly well/poorly:18703}. She reports sleeping {well/fairly well/poorly:18703}. She {does/does not:200015} have additional problems to discuss today.  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   Post menopausal syndrome 08/12/2014   Stroke [facility]    Past Surgical History:  Procedure Laterality Date   BUNIONECTOMY     05/2004, 2007   CATARACT EXTRACTION     04/2005, 06/2005   COLONOSCOPY W/ POLYPECTOMY     COLONOSCOPY WITH PROPOFOL N/A 02/15/2017   Procedure: COLONOSCOPY WITH PROPOFOL;  Surgeon: Jonathon Bellows, MD;  Location: [facility] ENDOSCOPY;  Service: Gastroenterology;  Laterality: N/A;   HYSTEROSCOPY  2011   ORIF WRIST FRACTURE Right 08/30/2015   Procedure: OPEN REDUCTION INTERNAL FIXATION (ORIF) RIGHT WRIST FRACTURE AND REPAIR AS NECESSARY;  Surgeon: Roseanne Kaufman, MD;  Location: [place];  Service: Orthopedics;  Laterality: Right;   UPPER GI ENDOSCOPY     WRIST FRACTURE SURGERY Left    06/2006   Social History   Socioeconomic History   Marital status: Divorced    Spouse name: Not on file   Number of children: 2   Years of education: college   Highest education level:  Master's degree (e.g., MA, MS, MEng, MEd, MSW, MBA)  Occupational History   Occupation: retired  Tobacco Use   Smoking status: Former   Smokeless tobacco: Never   Tobacco comments:    (08/29/15) quit 40 years ago  Vaping Use   Vaping Use: Never used  Substance and Sexual Activity   Alcohol use: Yes    Alcohol/week: 0.0 standard drinks of alcohol    Comment: occasional   Drug use: No   Sexual activity: Not on file  Other Topics Concern   Not on file  Social History Narrative   Lives alone.   Right-handed.   Caffeine use: 1 cup in the morning and sometimes a decaf cup in the afternoon   Social Determinants of Health   Financial Resource Strain: Low Risk  (07/13/2017)   Overall Financial Resource Strain (CARDIA)    Difficulty of Paying Living Expenses: Not hard at all  Food Insecurity: No Food Insecurity (07/13/2017)   Hunger Vital Sign    Worried About Running Out of Food in the Last Year: Never true    Ran Out of Food in the Last Year: Never true  Transportation Needs: No Transportation Needs (07/13/2017)   PRAPARE - Hydrologist (Medical): No    Lack of Transportation (Non-Medical): No  Physical Activity: Inactive (07/25/2018)   Exercise Vital Sign    Days of Exercise per Week: 0 days  Minutes of Exercise per Session: 0 min  Stress: No Stress Concern Present (07/25/2018)   [place]    Feeling of Stress : Only a little  Social Connections: Unknown (07/25/2018)   Social Connection and Isolation Panel [NHANES]    Frequency of Communication with Friends and Family: Patient refused    Frequency of Social Gatherings with Friends and Family: Patient refused    Attends Religious Services: Patient refused    Active Member of Clubs or Organizations: Patient refused    Attends Archivist Meetings: Patient refused    Marital Status: Patient refused  Intimate Partner Violence:  Unknown (07/25/2018)   Humiliation, Afraid, Rape, and Kick questionnaire    Fear of Current or Ex-Partner: Patient refused    Emotionally Abused: Patient refused    Physically Abused: Patient refused    Sexually Abused: Patient refused   Family Status  Relation Name Status   Mother  Deceased at age 71       May 28, 2013   Father  Deceased at age 71   Sister  Deceased at age 23   Daughter  Alive   Son  Alive   [place]  (Not Specified)   Family History  Problem Relation Age of Onset   Rheum arthritis Mother    Lung cancer Father    Ulcers Father    Hodgkin's lymphoma Sister    AVM Daughter    Breast cancer Paternal Grandmother    No Known Allergies  Patient Care Team: Gwyneth Sprout, FNP as PCP - General (Family Medicine) Kate Sable, MD as PCP - Cardiology (Cardiology) Dingeldein, Remo Lipps, MD as Consulting Physician (Ophthalmology) Dasher, Rayvon Char, MD as Consulting Physician (Dermatology) Frann Rider, NP as Nurse Practitioner (Neurology)   Medications: Outpatient Medications Prior to Visit  Medication Sig   acetaminophen (TYLENOL) 500 MG tablet Take 500 mg by mouth every 6 (six) hours as needed.   Calcium Carbonate-Vit D-Min (CALCIUM 1200 PO) Take by mouth daily.    chlorpheniramine (CHLOR-TRIMETON) 4 MG tablet Take 4 mg by mouth as needed for allergies.   cholecalciferol (VITAMIN D3) 25 MCG (1000 UT) tablet Take 1,000 Units by mouth daily.   denosumab (PROLIA) 60 MG/ML SOSY injection Inject into the skin every 6 (six) months.   divalproex (DEPAKOTE ER) 500 MG 24 hr tablet Take 1 tablet (500 mg total) by mouth at bedtime.   hydrochlorothiazide (HYDRODIURIL) 25 MG tablet Take 1 tablet (25 mg total) by mouth daily.   losartan (COZAAR) 50 MG tablet Take 1 tablet (50 mg total) by mouth daily.   polyethylene glycol powder (GLYCOLAX/MIRALAX) powder Take 1 Container by mouth as needed.    Propylene Glycol (SYSTANE BALANCE) 0.6 % SOLN Apply to eye as needed.   sodium chloride  (OCEAN) 0.65 % SOLN nasal spray Place 1 spray into both nostrils as needed for congestion.   timolol (TIMOPTIC) 0.5 % ophthalmic solution Place 1 drop into both eyes at bedtime.   No facility-administered medications prior to visit.    Review of Systems  {Labs  Heme  Chem  Endocrine  Serology  Results Review (optional):23779}  Objective    There were no vitals taken for this visit. {Show previous vital signs (optional):23777}   Physical Exam  ***  Last depression screening scores    11/19/2021    1:16 PM 05/29/2021    1:17 PM 12/01/2020   10:30 AM  PHQ 2/9 Scores  PHQ - 2 Score 0 0  0  PHQ- 9 Score 5  4   Last fall risk screening    11/19/2021    1:16 PM  [place] in the past year? 0  Number falls in past yr: 0  Injury with Fall? 0  Risk for fall due to : No Fall Risks  Follow up Falls evaluation completed   Last Audit-C alcohol use screening    11/19/2021    1:17 PM  Alcohol Use Disorder Test (AUDIT)  1. How often do you have a drink containing alcohol? 0  2. How many drinks containing alcohol do you have on a typical day when you are drinking? 0  3. How often do you have six or more drinks on one occasion? 0  AUDIT-C Score 0   A score of 3 or more in women, and 4 or more in men indicates increased risk for alcohol abuse, EXCEPT if all of the points are from question 1   No results found for any visits on 01/11/22.  Assessment & Plan    Routine Health Maintenance and Physical Exam  Exercise Activities and Dietary recommendations  Goals      Increase water intake     Recommend to continue current healthy diet and daily water intake of 6-8 glasses.           Immunization History  Administered Date(s) Administered   Fluad Quad(high Dose 65+) 09/20/2018, 11/19/2021   Influenza, High Dose Seasonal PF 11/24/2016   Influenza-Unspecified 10/13/2017, 12/17/2019, 11/13/2020   Pneumococcal Conjugate-13 07/02/2014   Pneumococcal  Polysaccharide-23 07/01/2004, 11/05/2004   Td 01/14/2003, 04/08/2010   Tdap 04/08/2010, 08/26/2015   Zoster Recombinat (Shingrix) 10/13/2017, 02/21/2018, 02/21/2018   Zoster, Live 03/11/2005    Health Maintenance  Topic Date Due   COVID-19 Vaccine (1) Never done   COLONOSCOPY (Pts 45-22yr Insurance coverage will need to be confirmed)  02/16/2020   DEXA SCAN  11/21/2021   Medicare Annual Wellness (AWV)  12/01/2021   DTaP/Tdap/Td (5 - Td or Tdap) 08/25/2025   Pneumonia Vaccine 87 Years old  Completed   INFLUENZA VACCINE  Completed   Zoster Vaccines- Shingrix  Completed   HPV VACCINES  Aged Out    Discussed health benefits of physical activity, and encouraged her to engage in regular exercise appropriate for her age and condition.  ***  No follow-ups on file.     {provider attestation***:1}   EGwyneth Sprout [phone](phone) [phone](fax)  [place]

## 2022-01-11 ENCOUNTER — Encounter: Payer: Medicare PPO | Admitting: Family Medicine

## 2022-01-12 ENCOUNTER — Ambulatory Visit: Payer: Medicare PPO | Admitting: Neurology

## 2022-01-12 ENCOUNTER — Encounter: Payer: Self-pay | Admitting: Neurology

## 2022-01-12 VITALS — BP 147/83 | HR 73 | Ht 67.0 in | Wt 115.5 lb

## 2022-01-12 DIAGNOSIS — R4189 Other symptoms and signs involving cognitive functions and awareness: Secondary | ICD-10-CM | POA: Diagnosis not present

## 2022-01-12 DIAGNOSIS — Z8679 Personal history of other diseases of the circulatory system: Secondary | ICD-10-CM | POA: Diagnosis not present

## 2022-01-12 DIAGNOSIS — G40209 Localization-related (focal) (partial) symptomatic epilepsy and epileptic syndromes with complex partial seizures, not intractable, without status epilepticus: Secondary | ICD-10-CM

## 2022-01-12 NOTE — Progress Notes (Signed)
Chief Complaint  Patient presents with   New Patient (Initial Visit)    Rm 12. Accompanied by daughter, [name]. NX Tyhesha Willis 2023/Internal referral for increased confusion/memory concerns, [facility].   ASSESSMENT AND PLAN  Courtney Willis is a 87 y.o. female    History of left frontal intracranial hemorrhage in December 2019 Complex partial seizure, September 2021  MRI of brain in December 2021 showed no acute abnormality, encephalomalacia left frontal lobe associated with  hemosiderin deposition, consistent with previous history of large left frontal intraparenchymal hemorrhage  EEG in April 2022 was normal  Keep Depakote ER '500mg'$  qhs.   Check Depakote level   Worsening cognitive impairment  Mild worsening, MoCA examination 22/30  Most likely aging process, with vascular component due to previous left frontal lobe hemorrhage.  Laboratory evaluation previously showed low normal range B12, mild anemia, will repeat level,  Return To Clinic With NP In 6 Months    DIAGNOSTIC DATA (LABS, IMAGING, TESTING) - I reviewed patient records, labs, notes, testing and imaging myself where available.  MRI of brain on Jan 14 2020:  Encephalomalacia in the left lobe with associated hemosiderin deposition consistent with prior history of left frontal intracranial hemorrhage, which was acute on Jan 13 2018. Moderate atrophy.   HISTORICAL  Courtney OLDENBURG is a 87 year old female, seen in request by primary care PA Fenton Malling for evaluation of episode of acute aphasia, upper extremity tremor, initial evaluation was on January 01, 2020.  I reviewed and summarized the referring note.  Past medical history Hypertension Left frontal Intracranial bleeding  Patient suffered left frontal intracranial bleeding in December 2019, presented with difficulty speaking, right arm weakness, for extensive rehabilitation, regained significant function, at baseline, 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 at [facility] [place], still  drives.  October 22, 2019, while she was eating brunch with her friend, she suddenly developed slurred speech, there was described whole body tremor, lasting for few minutes, she was taken by her friend to emergency room, by then, she was able to make phone call to her son, she was back to her baseline  She denies similar episode in the past  I personally reviewed CT head without contrast on October 22, 2019, generalized atrophy, chronic left frontal infarction, no acute intracranial abnormality  CT angiogram of head and neck December 2019, no large vessel disease  MRI of the brain on January 13, 2018 large left frontal intraparenchymal hemorrhage, with 4 mm rightward midline shift,  Laboratory evaluation in 2021: CMP, creatinine of 0.6, CBC, hemoglobin of 12.9, INR 0.9, vitamin D 51, TSH 4.5,  She was put on Depakote ER 500 mg every night for her complex partial seizure since September 2021, repeat MRI of the brain in December 2021 showed encephalomalacia in the left frontal lobe associated with hemosiderin deposition consistent with her previous history of large left frontal intracranial hemorrhage, which was acute in December 2019, multiple morbid generalized atrophy  UPDATE Oct 6th 2022: She is overall doing very well, accompanied by her daughter at today's clinical visit, 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, retired Nurse, adult, traveled all over the world, still taking college classes regularly,  She had no recurrent seizure, taking Depakote ER 500 mg every night, Depakote level was 40 in March 2022, normal TSH, CBC CMP  Her daughter is concerned about her mild short-term memory loss, MoCA examination 23/30 today, mild visual spatial disorientation, short-term memory loss, she still drives short distance on familiar route, denied lost while driving, daughter feels  safe for driving short distance during daytime now  UPDATE April 17th 2023: She is with her daughter [name]  at today's visit,,  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, no recurrent seizure, tolerating Depakote ER 500 mg daily,  Daughter reported that she has mild worsening memory loss, she just got her license renewed for 5 years, she still drives short distance, goes to [employer] regularly, spent a lot of time with her friend  She retired as a Warden/ranger,  Daughter also reported that she has some personality change, again personally reviewed MRI of the brain in December 2021, encephalomalacia left frontal lobe with associated hemosiderin deposition, mild to moderate generalized atrophy  UPDATE Jan 12 2022: She is with her daughter at today's clinical visit, she was at [facility] independent living, was noted to have increased confusion since September 2023, repeating questions, feel frustrated easily, daughter reported yesterday she called her sometimes within 10 minutes to check on her mealtime,  She has no recurrent seizure, daughter also worried about she has increased about unsteady gait, change of posturing,  She was taken to emergency room November 13, 2021, personally reviewed CT head, chronic left frontal encephalomalacia, atrophy chronic small vessel changes  MoCA examination today was 22/30  PHYSICAL EXAM   Vitals:   04/21/20 1518  BP: 124/73  Pulse: 93  Weight: 126 lb (57.2 kg)  Height: '5\' 8"'$  (1.727 m)   Body mass index is 19.16 kg/m.  PHYSICAL EXAMNIATION:  Gen: NAD, conversant, well nourised, well groomed           NEUROLOGICAL EXAM:  MENTAL STATUS: Speech/cognition: Awake, alert oriented to history taking and casual conversation     01/12/2022    3:27 PM 05/18/2021    2:30 PM 11/06/2020    3:07 PM  Montreal Cognitive Assessment   Visuospatial/ Executive (0/5) '5 5 3  '$ Naming (0/3) '3 3 3  '$ Attention: Read list of digits (0/2) '1 1 2  '$ Attention: Read list of letters (0/1) '1 1 1  '$ Attention: Serial 7 subtraction starting at 100 (0/3) '1  3 1  '$ Language: Repeat phrase (0/2) '2 1 2  '$ Language : Fluency (0/1) 0 0 1  Abstraction (0/2) '2 1 2  '$ Delayed Recall (0/5) '3 3 2  '$ Orientation (0/6) '4 6 6  '$ Total '22 24 23  '$ Adjusted Score (based on education) '22 24 23      '$ CRANIAL NERVES: CN II: Visual fields are full to confrontation. Pupils are round equal and briskly reactive to light. CN III, IV, VI: extraocular movement are normal. No ptosis. CN V: Facial sensation is intact to light touch CN VII: Face is symmetric with normal eye closure  CN VIII: Hearing is normal to causal conversation. CN IX, X: Phonation is normal. CN XI: Head turning and shoulder shrug are intact  MOTOR: Fixation of right upper extremity on rapid rotating movement, mild drift of right leg  REFLEXES: Reflexes are 2+ and symmetric at the biceps, triceps, knees, and ankles. Plantar responses are flexor.  SENSORY: Intact to light touch, pinprick and vibratory sensation are intact in fingers and toes.  COORDINATION: There is no trunk or limb dysmetria noted.  GAIT/STANCE: She can get up from seated position arm crossed, slight unsteady, dragging right leg some decreased right arm swing   REVIEW OF SYSTEMS: Full 14 system review of systems performed and notable only for as above All other review of systems were negative.  ALLERGIES: No Known Allergies  HOME MEDICATIONS:  Current Outpatient Medications  Medication Sig Dispense Refill   acetaminophen (TYLENOL) 500 MG tablet Take 500 mg by mouth every 6 (six) hours as needed.     Calcium Carbonate-Vit D-Min (CALCIUM 1200 PO) Take by mouth daily.      chlorpheniramine (CHLOR-TRIMETON) 4 MG tablet Take 4 mg by mouth as needed for allergies.     cholecalciferol (VITAMIN D3) 25 MCG (1000 UT) tablet Take 1,000 Units by mouth daily.     denosumab (PROLIA) 60 MG/ML SOSY injection Inject into the skin every 6 (six) months.     divalproex (DEPAKOTE ER) 500 MG 24 hr tablet Take 1 tablet (500 mg total) by mouth at  bedtime. 90 tablet 3   hydrochlorothiazide (HYDRODIURIL) 25 MG tablet Take 1 tablet (25 mg total) by mouth daily. 30 tablet 11   losartan (COZAAR) 50 MG tablet Take 1 tablet (50 mg total) by mouth daily. 30 tablet 11   polyethylene glycol powder (GLYCOLAX/MIRALAX) powder Take 1 Container by mouth as needed.      Propylene Glycol (SYSTANE BALANCE) 0.6 % SOLN Apply to eye as needed.     sodium chloride (OCEAN) 0.65 % SOLN nasal spray Place 1 spray into both nostrils as needed for congestion.     timolol (TIMOPTIC) 0.5 % ophthalmic solution Place 1 drop into both eyes at bedtime.  5   No current facility-administered medications for this visit.    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   Post menopausal syndrome 08/12/2014   Stroke [facility])     PAST SURGICAL HISTORY: Past Surgical History:  Procedure Laterality Date   BUNIONECTOMY     05/2004, 2007   CATARACT EXTRACTION     04/2005, 06/2005   COLONOSCOPY W/ POLYPECTOMY     COLONOSCOPY WITH PROPOFOL N/A 02/15/2017   Procedure: COLONOSCOPY WITH PROPOFOL;  Surgeon: Jonathon Bellows, MD;  Location: [facility] ENDOSCOPY;  Service: Gastroenterology;  Laterality: N/A;   HYSTEROSCOPY  2011   ORIF WRIST FRACTURE Right 08/30/2015   Procedure: OPEN REDUCTION INTERNAL FIXATION (ORIF) RIGHT WRIST FRACTURE AND REPAIR AS NECESSARY;  Surgeon: Roseanne Kaufman, MD;  Location: [place];  Service: Orthopedics;  Laterality: Right;   UPPER GI ENDOSCOPY     WRIST FRACTURE SURGERY Left    06/2006    FAMILY HISTORY: Family History   Problem Relation Age of Onset   Rheum arthritis Mother    Lung cancer Father    Ulcers Father    Hodgkin's lymphoma Sister    AVM Daughter    Breast cancer Paternal Grandmother     SOCIAL HISTORY: Social History   Socioeconomic History   Marital status: Divorced    Spouse name: Not on file   Number of children: 2   Years of education: college   Highest education level: Master's degree (e.g., MA, MS, MEng, MEd, MSW, MBA)  Occupational History   Occupation: retired  Tobacco Use   Smoking status: Former   Smokeless tobacco: Never   Tobacco comments:    (08/29/15) quit 40  years ago  Vaping Use   Vaping Use: Never used  Substance and Sexual Activity   Alcohol use: Yes    Alcohol/week: 0.0 standard drinks of alcohol    Comment: occasional   Drug use: No   Sexual activity: Not on file  Other Topics Concern   Not on file  Social History Narrative   Lives alone.   Right-handed.   Caffeine use: 1 cup in the morning and sometimes a decaf cup in the afternoon   Social Determinants of Health   Financial Resource Strain: Low Risk  (07/13/2017)   Overall Financial Resource Strain (CARDIA)    Difficulty of Paying Living Expenses: Not hard at all  Food Insecurity: No Food Insecurity (07/13/2017)   Hunger Vital Sign    Worried About Running Out of Food in the Last Year: Never true    Ran Out of Food in the Last Year: Never true  Transportation Needs: No Transportation Needs (07/13/2017)   PRAPARE - Hydrologist (Medical): No    Lack of Transportation (Non-Medical): No  Physical Activity: Inactive (07/25/2018)   Exercise Vital Sign    Days of Exercise per Week: 0 days    Minutes of Exercise per Session: 0 min  Stress: No Stress Concern Present (07/25/2018)   [place]    Feeling of Stress : Only a little  Social Connections: Unknown (07/25/2018)   Social Connection and Isolation Panel  [NHANES]    Frequency of Communication with Friends and Family: Patient refused    Frequency of Social Gatherings with Friends and Family: Patient refused    Attends Religious Services: Patient refused    Active Member of Clubs or Organizations: Patient refused    Attends Archivist Meetings: Patient refused    Marital Status: Patient refused  Intimate Partner Violence: Unknown (07/25/2018)   Humiliation, Afraid, Rape, and Kick questionnaire    Fear of Current or Ex-Partner: Patient refused    Emotionally Abused: Patient refused    Physically Abused: Patient refused    Sexually Abused: Patient refused    Marcial Pacas, M.D. Ph.D.  [facility] [address] [place], [place] [address] Ph: [phone] Fax: [phone]  CC:  Mar Daring, PA-C [place],  [place] [id number]

## 2022-01-14 LAB — IRON,TIBC AND FERRITIN PANEL
Ferritin: 131 ng/mL (ref 15–150)
Iron Saturation: 21 % (ref 15–55)
Iron: 83 ug/dL (ref 27–139)
Total Iron Binding Capacity: 392 ug/dL (ref 250–450)
UIBC: 309 ug/dL (ref 118–369)

## 2022-01-14 LAB — CBC WITH DIFFERENTIAL/PLATELET
Basophils Absolute: 0 10*3/uL (ref 0.0–0.2)
Basos: 0 %
EOS (ABSOLUTE): 0 10*3/uL (ref 0.0–0.4)
Eos: 0 %
Hematocrit: 36.6 % (ref 34.0–46.6)
Hemoglobin: 12.5 g/dL (ref 11.1–15.9)
Immature Grans (Abs): 0 10*3/uL (ref 0.0–0.1)
Immature Granulocytes: 0 %
Lymphocytes Absolute: 1.3 10*3/uL (ref 0.7–3.1)
Lymphs: 22 %
MCH: 31.7 pg (ref 26.6–33.0)
MCHC: 34.2 g/dL (ref 31.5–35.7)
MCV: 93 fL (ref 79–97)
Monocytes Absolute: 0.6 10*3/uL (ref 0.1–0.9)
Monocytes: 10 %
Neutrophils Absolute: 3.9 10*3/uL (ref 1.4–7.0)
Neutrophils: 68 %
Platelets: 327 10*3/uL (ref 150–450)
RBC: 3.94 x10E6/uL (ref 3.77–5.28)
RDW: 11.8 % (ref 11.7–15.4)
WBC: 5.7 10*3/uL (ref 3.4–10.8)

## 2022-01-14 LAB — VALPROIC ACID LEVEL: Valproic Acid Lvl: 46 ug/mL — ABNORMAL LOW (ref 50–100)

## 2022-01-14 LAB — COMPREHENSIVE METABOLIC PANEL
ALT: 11 IU/L (ref 0–32)
AST: 16 IU/L (ref 0–40)
Albumin/Globulin Ratio: 2.4 — ABNORMAL HIGH (ref 1.2–2.2)
Albumin: 4.7 g/dL (ref 3.7–4.7)
Alkaline Phosphatase: 36 IU/L — ABNORMAL LOW (ref 44–121)
BUN/Creatinine Ratio: 26 (ref 12–28)
BUN: 24 mg/dL (ref 8–27)
Bilirubin Total: 0.5 mg/dL (ref 0.0–1.2)
CO2: 25 mmol/L (ref 20–29)
Calcium: 10.5 mg/dL — ABNORMAL HIGH (ref 8.7–10.3)
Chloride: 93 mmol/L — ABNORMAL LOW (ref 96–106)
Creatinine, Ser: 0.94 mg/dL (ref 0.57–1.00)
Globulin, Total: 2 g/dL (ref 1.5–4.5)
Glucose: 97 mg/dL (ref 70–99)
Potassium: 5.4 mmol/L — ABNORMAL HIGH (ref 3.5–5.2)
Sodium: 131 mmol/L — ABNORMAL LOW (ref 134–144)
Total Protein: 6.7 g/dL (ref 6.0–8.5)
eGFR: 59 mL/min/{1.73_m2} — ABNORMAL LOW (ref >59)

## 2022-01-14 LAB — B12 AND FOLATE PANEL
Folate: 18.4 ng/mL (ref >3.0)
Vitamin B-12: 691 pg/mL (ref 232–1245)

## 2022-01-14 LAB — TSH: TSH: 3.79 u[IU]/mL (ref 0.450–4.500)

## 2022-01-18 ENCOUNTER — Telehealth: Payer: Self-pay | Admitting: Neurology

## 2022-01-18 MED ORDER — DIVALPROEX SODIUM ER 500 MG PO TB24: 500.0000 mg | ORAL_TABLET | Freq: Every day | ORAL | 3 refills | Status: DC

## 2022-01-18 NOTE — Telephone Encounter (Signed)
Refill sent as requested. 

## 2022-01-18 NOTE — Addendum Note (Signed)
Addended by: Verlin Grills on: 01/18/2022 04:07 PM   Modules accepted: Orders

## 2022-01-18 NOTE — Telephone Encounter (Signed)
[name] is calling. Requesting a refill on medication divalproex (DEPAKOTE ER) 500 MG 24 hr tablet. Refill should be sent to [facility], [place] . [employer]. [address] [place], [place] [phone].

## 2022-01-18 NOTE — Telephone Encounter (Signed)
Pharmacy called wanting to know when this Rx will be sent due to pt being completely out.

## 2022-02-04 ENCOUNTER — Encounter: Payer: Self-pay | Admitting: Family Medicine

## 2022-02-04 ENCOUNTER — Ambulatory Visit (INDEPENDENT_AMBULATORY_CARE_PROVIDER_SITE_OTHER): Payer: Medicare PPO | Admitting: Family Medicine

## 2022-02-04 VITALS — BP 140/80 | HR 76 | Temp 97.7°F | Resp 16 | Ht 67.0 in | Wt 115.9 lb

## 2022-02-04 DIAGNOSIS — E875 Hyperkalemia: Secondary | ICD-10-CM

## 2022-02-04 DIAGNOSIS — R636 Underweight: Secondary | ICD-10-CM | POA: Insufficient documentation

## 2022-02-04 DIAGNOSIS — Z0000 Encounter for general adult medical examination without abnormal findings: Secondary | ICD-10-CM | POA: Diagnosis not present

## 2022-02-04 DIAGNOSIS — G40209 Localization-related (focal) (partial) symptomatic epilepsy and epileptic syndromes with complex partial seizures, not intractable, without status epilepticus: Secondary | ICD-10-CM

## 2022-02-04 NOTE — Progress Notes (Signed)
I,Joseline E Rosas,acting as a scribe for Gwyneth Sprout, FNP.,have documented all relevant documentation on the behalf of Gwyneth Sprout, FNP,as directed by  Gwyneth Sprout, FNP while in the presence of Gwyneth Sprout, FNP.   Annual Wellness Visit  Patient: Courtney Willis, Female    DOB: 1934/07/05, 88 y.o.   MRN: [id number] Visit Date: 02/04/2022  Today's Provider: Gwyneth Sprout, FNP  Introduced to nurse practitioner role and practice setting.  All questions answered.  Discussed provider/patient relationship and expectations.  Patient presents with daughter, Courtney Willis.   Chief Complaint  Patient presents with   Annual Exam   Subjective    Courtney Willis is a 88 y.o. female who presents today for her Annual Wellness Visit. She reports consuming a general diet. The patient does not participate in regular exercise at present. She generally feels well. She reports sleeping well. She does not have additional problems to discuss today.   HPI  Medications: Outpatient Medications Prior to Visit  Medication Sig   acetaminophen (TYLENOL) 500 MG tablet Take 500 mg by mouth every 6 (six) hours as needed.   Calcium Carbonate-Vit D-Min (CALCIUM 1200 PO) Take by mouth daily.    chlorpheniramine (CHLOR-TRIMETON) 4 MG tablet Take 4 mg by mouth as needed for allergies.   cholecalciferol (VITAMIN D3) 25 MCG (1000 UT) tablet Take 1,000 Units by mouth daily.   denosumab (PROLIA) 60 MG/ML SOSY injection Inject into the skin every 6 (six) months.   divalproex (DEPAKOTE ER) 500 MG 24 hr tablet Take 1 tablet (500 mg total) by mouth at bedtime.   hydrochlorothiazide (HYDRODIURIL) 25 MG tablet Take 1 tablet (25 mg total) by mouth daily.   losartan (COZAAR) 50 MG tablet Take 1 tablet (50 mg total) by mouth daily.   polyethylene glycol powder (GLYCOLAX/MIRALAX) powder Take 1 Container by mouth as needed.    Propylene Glycol (SYSTANE BALANCE) 0.6 % SOLN Apply to eye as needed.   sodium chloride (OCEAN) 0.65 % SOLN nasal  spray Place 1 spray into both nostrils as needed for congestion.   timolol (TIMOPTIC) 0.5 % ophthalmic solution Place 1 drop into both eyes at bedtime.   No facility-administered medications prior to visit.    No Known Allergies  Patient Care Team: Gwyneth Sprout, FNP as PCP - General (Family Medicine) Kate Sable, MD as PCP - Cardiology (Cardiology) Dingeldein, Remo Lipps, MD as Consulting Physician (Ophthalmology) Dasher, Rayvon Char, MD as Consulting Physician (Dermatology) Frann Rider, NP as Nurse Practitioner (Neurology)  Review of Systems  Last CBC Lab Results  Component Value Date   WBC 5.7 01/12/2022   HGB 12.5 01/12/2022   HCT 36.6 01/12/2022   MCV 93 01/12/2022   MCH 31.7 01/12/2022   RDW 11.8 01/12/2022   PLT 327 57/02/7791   Last metabolic panel Lab Results  Component Value Date   GLUCOSE 97 01/12/2022   NA 131 (L) 01/12/2022   K 5.4 (H) 01/12/2022   CL 93 (L) 01/12/2022   CO2 25 01/12/2022   BUN 24 01/12/2022   CREATININE 0.94 01/12/2022   EGFR 59 (L) 01/12/2022   CALCIUM 10.5 (H) 01/12/2022   PROT 6.7 01/12/2022   ALBUMIN 4.7 01/12/2022   LABGLOB 2.0 01/12/2022   AGRATIO 2.4 (H) 01/12/2022   BILITOT 0.5 01/12/2022   ALKPHOS 36 (L) 01/12/2022   AST 16 01/12/2022   ALT 11 01/12/2022   ANIONGAP 9 11/13/2021   Last lipids Lab Results  Component Value Date  CHOL 192 09/24/2019   HDL 67 09/24/2019   LDLCALC 110 (H) 09/24/2019   TRIG 83 09/24/2019   CHOLHDL 2.8 09/20/2018   Last hemoglobin A1c Lab Results  Component Value Date   HGBA1C 5.6 12/01/2020   Last thyroid functions Lab Results  Component Value Date   TSH 3.790 01/12/2022   T4TOTAL 5.8 09/20/2018   Last vitamin D Lab Results  Component Value Date   VD25OH 51.6 09/24/2019   Last vitamin B12 and Folate Lab Results  Component Value Date   VITAMINB12 691 01/12/2022   FOLATE 18.4 01/12/2022    Objective    Vitals: BP (!) 150/66 (BP Location: Right Arm, Patient Position:  Sitting, Cuff Size: Normal)   Pulse 76   Temp 97.7 F (36.5 C) (Oral)   Resp 16   Ht _0  (1.702 m)   Wt 115 lb 14.4 oz (52.6 kg)   BMI 18.15 kg/m   BP Readings from Last 3 Encounters:  02/04/22 (!) 150/66  01/12/22 (!) 147/83  11/19/21 117/69   Wt Readings from Last 3 Encounters:  02/04/22 115 lb 14.4 oz (52.6 kg)  01/12/22 115 lb 8 oz (52.4 kg)  11/19/21 118 lb (53.5 kg)   SpO2 Readings from Last 3 Encounters:  11/19/21 99%  11/13/21 100%  09/04/21 99%   Physical Exam Vitals and nursing note reviewed.  Constitutional:      General: She is awake. She is not in acute distress.    Appearance: Normal appearance. She is well-developed, well-groomed and underweight. She is not ill-appearing, toxic-appearing or diaphoretic.  HENT:     Head: Normocephalic and atraumatic.     Jaw: There is normal jaw occlusion. No trismus, tenderness, swelling or pain on movement.     Right Ear: Hearing, tympanic membrane, ear canal and external ear normal. There is no impacted cerumen.     Left Ear: Hearing, tympanic membrane, ear canal and external ear normal. There is no impacted cerumen.     Nose: Nose normal. No congestion or rhinorrhea.     Right Turbinates: Not enlarged, swollen or pale.     Left Turbinates: Not enlarged, swollen or pale.     Right Sinus: No maxillary sinus tenderness or frontal sinus tenderness.     Left Sinus: No maxillary sinus tenderness or frontal sinus tenderness.     Mouth/Throat:     Lips: Pink.     Mouth: Mucous membranes are moist. No injury.     Tongue: No lesions.     Pharynx: Oropharynx is clear. Uvula midline. No pharyngeal swelling, oropharyngeal exudate, posterior oropharyngeal erythema or uvula swelling.     Tonsils: No tonsillar exudate or tonsillar abscesses.  Eyes:     General: Lids are normal. Lids are everted, no foreign bodies appreciated. Vision grossly intact. Gaze aligned appropriately. No allergic shiner or visual field deficit.       Right  eye: No discharge.        Left eye: No discharge.     Extraocular Movements: Extraocular movements intact.     Conjunctiva/sclera: Conjunctivae normal.     Right eye: Right conjunctiva is not injected. No exudate.    Left eye: Left conjunctiva is not injected. No exudate.    Pupils: Pupils are equal, round, and reactive to light.  Neck:     Thyroid: No thyroid mass, thyromegaly or thyroid tenderness.     Vascular: No carotid bruit.     Trachea: Trachea normal.  Cardiovascular:  Rate and Rhythm: Normal rate and regular rhythm.     Pulses: Normal pulses.          Carotid pulses are 2+ on the right side and 2+ on the left side.      Radial pulses are 2+ on the right side and 2+ on the left side.       Dorsalis pedis pulses are 2+ on the right side and 2+ on the left side.       Posterior tibial pulses are 2+ on the right side and 2+ on the left side.     Heart sounds: Normal heart sounds, S1 normal and S2 normal. No murmur heard.    No friction rub. No gallop.  Pulmonary:     Effort: Pulmonary effort is normal. No respiratory distress.     Breath sounds: Normal breath sounds and air entry. No stridor. No wheezing, rhonchi or rales.  Chest:     Chest wall: No tenderness.  Abdominal:     General: Abdomen is flat. Bowel sounds are normal. There is no distension.     Palpations: Abdomen is soft. There is no mass.     Tenderness: There is no abdominal tenderness. There is no right CVA tenderness, left CVA tenderness, guarding or rebound.     Hernia: No hernia is present.  Genitourinary:    Comments: Exam deferred; denies complaints Musculoskeletal:        General: No swelling, tenderness, deformity or signs of injury. Normal range of motion.     Cervical back: Full passive range of motion without pain, normal range of motion and neck supple. No edema, rigidity or tenderness. No muscular tenderness.     Right lower leg: No edema.     Left lower leg: No edema.  Lymphadenopathy:      Cervical: No cervical adenopathy.     Right cervical: No superficial, deep or posterior cervical adenopathy.    Left cervical: No superficial, deep or posterior cervical adenopathy.  Skin:    General: Skin is warm and dry.     Capillary Refill: Capillary refill takes less than 2 seconds.     Coloration: Skin is not jaundiced or pale.     Findings: No bruising, erythema, lesion or rash.  Neurological:     General: No focal deficit present.     Mental Status: She is alert and oriented to person, place, and time. Mental status is at baseline.     GCS: GCS eye subscore is 4. GCS verbal subscore is 5. GCS motor subscore is 6.     Sensory: Sensation is intact. No sensory deficit.     Motor: Motor function is intact. No weakness.     Coordination: Coordination is intact. Coordination normal.     Gait: Gait is intact. Gait normal.  Psychiatric:        Attention and Perception: Attention and perception normal.        Mood and Affect: Affect normal. Mood is anxious.        Speech: Speech normal.        Behavior: Behavior normal. Behavior is cooperative.        Thought Content: Thought content normal.        Cognition and Memory: Cognition is impaired. Memory is impaired. She exhibits impaired recent memory and impaired remote memory.        Judgment: Judgment normal.    Most recent functional status assessment:    02/04/2022    2:22 PM  In your present state of health, do you have any difficulty performing the following activities:  Hearing? 0  Vision? 0  Difficulty concentrating or making decisions? 1  Walking or climbing stairs? 1  Dressing or bathing? 0  Doing errands, shopping? 1   Most recent fall risk assessment:    02/04/2022    2:22 PM  [place] in the past year? 0  Number falls in past yr: 0  Injury with Fall? 0  Risk for fall due to : No Fall Risks    Most recent depression screenings:    02/04/2022    2:22 PM 11/19/2021    1:16 PM  PHQ 2/9 Scores  PHQ - 2  Score 0 0  PHQ- 9 Score 6 5   Most recent cognitive screening:    09/24/2019   10:10 AM  6CIT Screen  What Year? 0 points  What month? 0 points  What time? 0 points  Count back from 20 0 points  Months in reverse 0 points  Repeat phrase 2 points  Total Score 2 points   Most recent Audit-C alcohol use screening    02/04/2022    2:23 PM  Alcohol Use Disorder Test (AUDIT)  1. How often do you have a drink containing alcohol? 0  2. How many drinks containing alcohol do you have on a typical day when you are drinking? 0  3. How often do you have six or more drinks on one occasion? 0  AUDIT-C Score 0   A score of 3 or more in women, and 4 or more in men indicates increased risk for alcohol abuse, EXCEPT if all of the points are from question 1   No results found for any visits on 02/04/22.  Assessment & Plan     Annual wellness visit done today including the all of the following: Reviewed patient's Family Medical History Reviewed and updated list of patient's medical providers Assessment of cognitive impairment was done Assessed patient's functional ability Established a written schedule for health screening [place] Completed and Reviewed  Exercise Activities and Dietary recommendations  Goals      Increase water intake     Recommend to continue current healthy diet and daily water intake of 6-8 glasses.          Immunization History  Administered Date(s) Administered   Fluad Quad(high Dose 65+) 09/20/2018, 11/19/2021   Influenza, High Dose Seasonal PF 11/24/2016   Influenza-Unspecified 10/13/2017, 12/17/2019, 11/13/2020   Pneumococcal Conjugate-13 07/02/2014   Pneumococcal Polysaccharide-23 07/01/2004, 11/05/2004   Td 01/14/2003, 04/08/2010   Tdap 04/08/2010, 08/26/2015   Zoster Recombinat (Shingrix) 10/13/2017, 02/21/2018, 02/21/2018   Zoster, Live 03/11/2005    Health Maintenance  Topic Date Due   COVID-19 Vaccine (1) 02/20/2022  (Originally 11/15/1934)   DEXA SCAN  02/05/2023 (Originally 11/21/2021)   COLONOSCOPY (Pts 45-65yr Insurance coverage will need to be confirmed)  02/05/2023 (Originally 02/16/2020)   Medicare Annual Wellness (AWV)  02/05/2023   DTaP/Tdap/Td (5 - Td or Tdap) 08/25/2025   Pneumonia Vaccine 86 Years old  Completed   INFLUENZA VACCINE  Completed   Zoster Vaccines- Shingrix  Completed   HPV VACCINES  Aged Out    Discussed health benefits of physical activity, and encouraged her to engage in regular exercise appropriate for her age and condition.    Problem List Items Addressed This Visit       Nervous and Auditory   Partial symptomatic epilepsy with complex partial  seizures, not intractable, without status epilepticus (HCC)    Chronic, stable Use of depakote ER at 500 mg to assist Followed by [facility]        Other   Annual physical exam - Primary    Patient declines additional labs at this time given historical elevation of hyperkalemia with use of losartan 50 mg- CCM referral to assist further Things to do to keep yourself healthy  - Exercise at least 30-45 minutes a day, 3-4 days a week.  - Eat a low-fat diet with lots of fruits and vegetables, up to 7-9 servings per day.  - Seatbelts can save your life. Wear them always.  - Smoke detectors on every level of your home, check batteries every year.  - Eye Doctor - have an eye exam every 1-2 years  - Safe sex - if you may be exposed to STDs, use a condom.  - Alcohol -  If you drink, do it moderately, less than 2 drinks per day.  - [place]. Choose someone to speak for you if you are not able.  - Depression is common in our stressful world.If you're feeling down or losing interest in things you normally enjoy, please come in for a visit.  - Violence - If anyone is threatening or hurting you, please call immediately.  Wishes to defer mammo to every other year; defers additional screenings at this time due to cognitive  deficits      Mildly underweight adult    Chronic, worsening Body mass index is 18.15 kg/m. Continue to recommend balanced meals       Other Visit Diagnoses     Hyperkalemia       Relevant Orders   AMB Referral to [place] ([facility] Patients)      Return in about 6 months (around 08/05/2022) for chonic disease management.    Vonna Kotyk, FNP, have reviewed all documentation for this visit. The documentation on 02/04/22 for the exam, diagnosis, procedures, and orders are all accurate and complete.  Gwyneth Sprout, [place] [phone] (phone) [phone] (fax)  [place]

## 2022-02-04 NOTE — Assessment & Plan Note (Signed)
Patient declines additional labs at this time given historical elevation of hyperkalemia with use of losartan 50 mg- CCM referral to assist further Things to do to keep yourself healthy  - Exercise at least 30-45 minutes a day, 3-4 days a week.  - Eat a low-fat diet with lots of fruits and vegetables, up to 7-9 servings per day.  - Seatbelts can save your life. Wear them always.  - Smoke detectors on every level of your home, check batteries every year.  - Eye Doctor - have an eye exam every 1-2 years  - Safe sex - if you may be exposed to STDs, use a condom.  - Alcohol -  If you drink, do it moderately, less than 2 drinks per day.  - [place]. Choose someone to speak for you if you are not able.  - Depression is common in our stressful world.If you're feeling down or losing interest in things you normally enjoy, please come in for a visit.  - Violence - If anyone is threatening or hurting you, please call immediately.  Wishes to defer mammo to every other year; defers additional screenings at this time due to cognitive deficits

## 2022-02-04 NOTE — Assessment & Plan Note (Signed)
Chronic, worsening Body mass index is 18.15 kg/m. Continue to recommend balanced meals

## 2022-02-04 NOTE — Assessment & Plan Note (Signed)
Chronic, stable Use of depakote ER at 500 mg to assist Followed by [facility]

## 2022-02-10 ENCOUNTER — Telehealth: Payer: Self-pay

## 2022-02-10 NOTE — Progress Notes (Signed)
   Care Guide Note  02/10/2022 Name: [name] MRN: [id number] DOB: [birth year]  Referred by: Gwyneth Sprout, FNP Reason for referral : Care Coordination (Outreach to schedule referral with [facility] )   Courtney Willis is a 88 y.o. year old female who is a primary care patient of Gwyneth Sprout, FNP. Katina Degree was referred to the pharmacist for assistance related to HTN.    Successful contact was made with the patient to discuss pharmacy services including being ready for the pharmacist to call at least 5 minutes before the scheduled appointment time, to have medication bottles and any blood sugar or blood pressure readings ready for review. The patient agreed to meet with the pharmacist via with the pharmacist via telephone visit on (date/time).  02/11/2022  Noreene Larsson, [place], [place] [address] Direct Dial: [phone] [email or web address]

## 2022-02-11 ENCOUNTER — Other Ambulatory Visit: Payer: Medicare PPO

## 2022-02-11 NOTE — Patient Instructions (Addendum)
Good afternoon,  It was a pleasure speaking with you earlier, and I appreciate the information you were able to provide.  As I mentioned during our call, and based on history your provided; I believe Ms Tom's elevated potassium likely to have been related to dehydration versus her losartan for blood pressure.    I do recommend follow-up (if not already scheduled) with Tally Joe in July, so labs can be drawn for monitoring.  Also, like I mentioned, I believe it would be a good idea to ask home health if they could take regular blood pressures when they come out.  If blood pressure readings are irregular- or if you have any other concerns- please reach out to myself or Daneil Dan  Thank you!  Paulina Fusi, PharmD

## 2022-02-11 NOTE — Progress Notes (Signed)
02/11/2022 Name: Courtney Willis MRN: [id number] DOB: 1934-03-20  Chief Complaint  Patient presents with   Abnormal Lab    PCP referred for hyperkalemia    Courtney Willis is a 88 y.o. year old female who presented for a telephone visit.   They were referred to the pharmacist by their PCP for assistance in managing hyperkalemia while on ARB (losartan) therapy.  Patient is participating in a Managed Medicaid Plan:  No  Subjective: Patient referred to pharmacy after elevated potassium value recorded 01/12/2022.  Concern that losartan could be attributing to this elevation.  Care Team: Primary Care Provider: Gwyneth Sprout, FNP ; Next Scheduled Visit: July 2024  Objective: Previous K values include:4.0 (11/13/21), 5.2 (12/01/20),  4.8 (04/21/20),  3.9 (10/22/2019)  Lab Results  Component Value Date   CREATININE 0.94 01/12/2022   BUN 24 01/12/2022   NA 131 (L) 01/12/2022   K 5.4 (H) 01/12/2022   CL 93 (L) 01/12/2022   CO2 25 01/12/2022   Medications Reviewed Today     Reviewed by Darlina Guys, [place] (Pharmacist) on 02/11/22 at 51  Med List Status: <None>   Medication Order Taking? Sig Documenting Provider Last Dose Status Informant  acetaminophen (TYLENOL) 500 MG tablet [id number] No Take 500 mg by mouth every 6 (six) hours as needed. [provider] Taking Active   Calcium Carbonate-Vit D-Min (CALCIUM 1200 PO) [id number] No Take by mouth daily.  [provider] Taking Active   chlorpheniramine (CHLOR-TRIMETON) 4 MG tablet [id number] No Take 4 mg by mouth as needed for allergies. [provider] Taking Active   cholecalciferol (VITAMIN D3) 25 MCG (1000 UT) tablet [id number] No Take 1,000 Units by mouth daily. [provider] Taking Active   denosumab (PROLIA) 60 MG/ML SOSY injection [id number] No Inject into the skin every 6 (six) months. [provider] Taking Active   divalproex (DEPAKOTE ER) 500 MG 24 hr tablet [id number] No Take 1 tablet  (500 mg total) by mouth at bedtime. Marcial Pacas, MD Taking Active   hydrochlorothiazide (HYDRODIURIL) 25 MG tablet [id number] No Take 1 tablet (25 mg total) by mouth daily. Gwyneth Sprout, FNP Taking Active   losartan (COZAAR) 50 MG tablet [id number] No Take 1 tablet (50 mg total) by mouth daily. Gwyneth Sprout, FNP Taking Active   polyethylene glycol powder [facility]) powder [id number] No Take 1 Container by mouth as needed.  [provider] Taking Active Self  Propylene Glycol (SYSTANE BALANCE) 0.6 % SOLN [id number] No Apply to eye as needed. [provider] Taking Active   sodium chloride (OCEAN) 0.65 % SOLN nasal spray [id number] No Place 1 spray into both nostrils as needed for congestion. [provider] Taking Active   timolol (TIMOPTIC) 0.5 % ophthalmic solution [id number] No Place 1 drop into both eyes at bedtime. [provider] Taking Active Family Member           Med Note Courtney Willis A   Thu Aug 28, 2015 11:10 AM)              Assessment/Plan:  -Spoke with patient's daughter today.  Endorses Ms. Padia was forgetting to take medications and eat/drink prior to hospitalization when the elevated potassium level was drawn. -Medical record shows losartan '25mg'$  dating back to at least August 2020, with an increase to '50mg'$  February of 2022 -Potassium levels have historically been within normal limits -Patient now has home health coming out daily to administer medications and ensure proper nurishment/hydration-  this is for the short term until a spot is available in a nursing facility -Recommended to daughter to have home health assess BP regularly and reach out to care team if irregular  -I believe elevated potassium level likely transient and related to possible dehydration based on previously normal K values with long-term use of losartan  Follow Up Plan: CMP at July Follow-up visit.  If K still elevated, decrease losartan by 50% to '25mg'$ , or use  alternative agent.

## 2022-02-18 ENCOUNTER — Telehealth: Payer: Self-pay

## 2022-02-18 ENCOUNTER — Telehealth: Payer: Self-pay | Admitting: Family Medicine

## 2022-02-18 NOTE — Telephone Encounter (Signed)
Copied from CRM #[id number]. Topic: Medical Record Request - Other >> Feb 17, 2022  4:21 PM Tiffany B wrote: Caller states [facility] is currently awaiting medical records in order for patient to move from independent living to assisting living,  Caller was unsure if it was the last 3 or 4 visit office notes or the last 2 years. Caller would like PCP nurse to call [facility] and ask to speak with   Courtney Willis  [phone]  Caller did stat time is sensitive and patient should not be living alone and would like a follow up call as soon as possible 

## 2022-02-18 NOTE — Telephone Encounter (Signed)
Copied from [place] [id number]. Topic: Medical Record Request - Other >> Feb 17, 2022  4:21 PM Tiffany B wrote: [place] states [facility] is currently awaiting medical records in order for patient to move from independent living to assisting living,  Caller was unsure if it was the last 3 or 4 visit office notes or the last 2 years. Caller would like PCP nurse to call [facility] and ask to speak with   Veto Kemps  [phone]  Caller did stat time is sensitive and patient should not be living alone and would like a follow up call as soon as possible

## 2022-02-18 NOTE — Telephone Encounter (Signed)
Copied from CRM #[id number]. Topic: Medical Record Request - Other >> Feb 17, 2022  4:21 PM Tiffany B wrote: Caller states [facility] is currently awaiting medical records in order for patient to move from independent living to assisting living,  Caller was unsure if it was the last 3 or 4 visit office notes or the last 2 years. Caller would like PCP nurse to call [facility] and ask to speak with   Sharece Wolf  [phone]  Caller did stat time is sensitive and patient should not be living alone and would like a follow up call as soon as possible 

## 2022-02-18 NOTE — Telephone Encounter (Signed)
[email or web address] for [facility] to advise. CRM created Last 4 OV Notes faxed. If more are needed please let us know.

## 2022-02-18 NOTE — Telephone Encounter (Signed)
Copied from CRM #[id number]. Topic: Medical Record Request - Provider/Facility Request >> Feb 17, 2022  4:41 PM Everette C wrote: Reason for CRM: Debbie with [facility] has called to follow up on a previous request for the patient's last 4 office visit notes   Debbie can be reached via fax at [phone] Attention Debbie   Debbie has called to stress the urgency of the request for records, the patient will have an assessment for living and intake conducted on 02/23/22  Please contact further if needed 

## 2022-02-18 NOTE — Telephone Encounter (Signed)
Copied from [place] [id number]. Topic: Medical Record Request - Provider/Facility Request >> Feb 17, 2022  4:41 PM Everette C wrote: Reason for CRM: Debbie with [facility] has called to follow up on a previous request for the patient's last 4 office visit notes   Jackelyn Poling can be reached via fax at [phone] Attention Patrice [facility] has called to stress the urgency of the request for records, the patient will have an assessment for living and intake conducted on 02/23/22  Please contact further if needed

## 2022-02-18 NOTE — Telephone Encounter (Signed)
Faxed over

## 2022-03-01 ENCOUNTER — Other Ambulatory Visit: Payer: Self-pay | Admitting: Family Medicine

## 2022-03-17 DIAGNOSIS — R2681 Unsteadiness on feet: Secondary | ICD-10-CM | POA: Diagnosis not present

## 2022-03-17 DIAGNOSIS — R4189 Other symptoms and signs involving cognitive functions and awareness: Secondary | ICD-10-CM | POA: Diagnosis not present

## 2022-03-17 DIAGNOSIS — Z741 Need for assistance with personal care: Secondary | ICD-10-CM | POA: Diagnosis not present

## 2022-03-17 DIAGNOSIS — R278 Other lack of coordination: Secondary | ICD-10-CM | POA: Diagnosis not present

## 2022-03-17 DIAGNOSIS — I872 Venous insufficiency (chronic) (peripheral): Secondary | ICD-10-CM | POA: Diagnosis not present

## 2022-03-17 DIAGNOSIS — I619 Nontraumatic intracerebral hemorrhage, unspecified: Secondary | ICD-10-CM | POA: Diagnosis not present

## 2022-03-17 DIAGNOSIS — R2689 Other abnormalities of gait and mobility: Secondary | ICD-10-CM | POA: Diagnosis not present

## 2022-03-17 DIAGNOSIS — M6281 Muscle weakness (generalized): Secondary | ICD-10-CM | POA: Diagnosis not present

## 2022-03-22 DIAGNOSIS — M6281 Muscle weakness (generalized): Secondary | ICD-10-CM | POA: Diagnosis not present

## 2022-03-22 DIAGNOSIS — R2689 Other abnormalities of gait and mobility: Secondary | ICD-10-CM | POA: Diagnosis not present

## 2022-03-22 DIAGNOSIS — I619 Nontraumatic intracerebral hemorrhage, unspecified: Secondary | ICD-10-CM | POA: Diagnosis not present

## 2022-03-22 DIAGNOSIS — Z741 Need for assistance with personal care: Secondary | ICD-10-CM | POA: Diagnosis not present

## 2022-03-22 DIAGNOSIS — I872 Venous insufficiency (chronic) (peripheral): Secondary | ICD-10-CM | POA: Diagnosis not present

## 2022-03-22 DIAGNOSIS — R2681 Unsteadiness on feet: Secondary | ICD-10-CM | POA: Diagnosis not present

## 2022-03-22 DIAGNOSIS — R278 Other lack of coordination: Secondary | ICD-10-CM | POA: Diagnosis not present

## 2022-03-22 DIAGNOSIS — R4189 Other symptoms and signs involving cognitive functions and awareness: Secondary | ICD-10-CM | POA: Diagnosis not present

## 2022-03-23 DIAGNOSIS — R278 Other lack of coordination: Secondary | ICD-10-CM | POA: Diagnosis not present

## 2022-03-23 DIAGNOSIS — Z741 Need for assistance with personal care: Secondary | ICD-10-CM | POA: Diagnosis not present

## 2022-03-23 DIAGNOSIS — I872 Venous insufficiency (chronic) (peripheral): Secondary | ICD-10-CM | POA: Diagnosis not present

## 2022-03-23 DIAGNOSIS — I619 Nontraumatic intracerebral hemorrhage, unspecified: Secondary | ICD-10-CM | POA: Diagnosis not present

## 2022-03-23 DIAGNOSIS — R4189 Other symptoms and signs involving cognitive functions and awareness: Secondary | ICD-10-CM | POA: Diagnosis not present

## 2022-03-23 DIAGNOSIS — R2681 Unsteadiness on feet: Secondary | ICD-10-CM | POA: Diagnosis not present

## 2022-03-23 DIAGNOSIS — R2689 Other abnormalities of gait and mobility: Secondary | ICD-10-CM | POA: Diagnosis not present

## 2022-03-23 DIAGNOSIS — M6281 Muscle weakness (generalized): Secondary | ICD-10-CM | POA: Diagnosis not present

## 2022-03-25 DIAGNOSIS — R0989 Other specified symptoms and signs involving the circulatory and respiratory systems: Secondary | ICD-10-CM | POA: Diagnosis not present

## 2022-03-25 DIAGNOSIS — R2681 Unsteadiness on feet: Secondary | ICD-10-CM | POA: Diagnosis not present

## 2022-03-25 DIAGNOSIS — I619 Nontraumatic intracerebral hemorrhage, unspecified: Secondary | ICD-10-CM | POA: Diagnosis not present

## 2022-03-25 DIAGNOSIS — R2689 Other abnormalities of gait and mobility: Secondary | ICD-10-CM | POA: Diagnosis not present

## 2022-03-25 DIAGNOSIS — Z741 Need for assistance with personal care: Secondary | ICD-10-CM | POA: Diagnosis not present

## 2022-03-25 DIAGNOSIS — R278 Other lack of coordination: Secondary | ICD-10-CM | POA: Diagnosis not present

## 2022-03-25 DIAGNOSIS — R4189 Other symptoms and signs involving cognitive functions and awareness: Secondary | ICD-10-CM | POA: Diagnosis not present

## 2022-03-25 DIAGNOSIS — M6281 Muscle weakness (generalized): Secondary | ICD-10-CM | POA: Diagnosis not present

## 2022-03-25 DIAGNOSIS — M25512 Pain in left shoulder: Secondary | ICD-10-CM | POA: Diagnosis not present

## 2022-03-25 DIAGNOSIS — I872 Venous insufficiency (chronic) (peripheral): Secondary | ICD-10-CM | POA: Diagnosis not present

## 2022-03-29 DIAGNOSIS — I872 Venous insufficiency (chronic) (peripheral): Secondary | ICD-10-CM | POA: Diagnosis not present

## 2022-03-29 DIAGNOSIS — R4189 Other symptoms and signs involving cognitive functions and awareness: Secondary | ICD-10-CM | POA: Diagnosis not present

## 2022-03-29 DIAGNOSIS — Z741 Need for assistance with personal care: Secondary | ICD-10-CM | POA: Diagnosis not present

## 2022-03-29 DIAGNOSIS — R2681 Unsteadiness on feet: Secondary | ICD-10-CM | POA: Diagnosis not present

## 2022-03-29 DIAGNOSIS — R278 Other lack of coordination: Secondary | ICD-10-CM | POA: Diagnosis not present

## 2022-03-29 DIAGNOSIS — I619 Nontraumatic intracerebral hemorrhage, unspecified: Secondary | ICD-10-CM | POA: Diagnosis not present

## 2022-03-29 DIAGNOSIS — R2689 Other abnormalities of gait and mobility: Secondary | ICD-10-CM | POA: Diagnosis not present

## 2022-03-29 DIAGNOSIS — M6281 Muscle weakness (generalized): Secondary | ICD-10-CM | POA: Diagnosis not present

## 2022-03-30 DIAGNOSIS — M6281 Muscle weakness (generalized): Secondary | ICD-10-CM | POA: Diagnosis not present

## 2022-03-30 DIAGNOSIS — R4189 Other symptoms and signs involving cognitive functions and awareness: Secondary | ICD-10-CM | POA: Diagnosis not present

## 2022-03-30 DIAGNOSIS — R2689 Other abnormalities of gait and mobility: Secondary | ICD-10-CM | POA: Diagnosis not present

## 2022-03-30 DIAGNOSIS — R2681 Unsteadiness on feet: Secondary | ICD-10-CM | POA: Diagnosis not present

## 2022-03-30 DIAGNOSIS — Z741 Need for assistance with personal care: Secondary | ICD-10-CM | POA: Diagnosis not present

## 2022-03-30 DIAGNOSIS — R278 Other lack of coordination: Secondary | ICD-10-CM | POA: Diagnosis not present

## 2022-03-30 DIAGNOSIS — I872 Venous insufficiency (chronic) (peripheral): Secondary | ICD-10-CM | POA: Diagnosis not present

## 2022-03-30 DIAGNOSIS — I619 Nontraumatic intracerebral hemorrhage, unspecified: Secondary | ICD-10-CM | POA: Diagnosis not present

## 2022-03-31 DIAGNOSIS — M6281 Muscle weakness (generalized): Secondary | ICD-10-CM | POA: Diagnosis not present

## 2022-03-31 DIAGNOSIS — R2681 Unsteadiness on feet: Secondary | ICD-10-CM | POA: Diagnosis not present

## 2022-03-31 DIAGNOSIS — R278 Other lack of coordination: Secondary | ICD-10-CM | POA: Diagnosis not present

## 2022-03-31 DIAGNOSIS — R4189 Other symptoms and signs involving cognitive functions and awareness: Secondary | ICD-10-CM | POA: Diagnosis not present

## 2022-03-31 DIAGNOSIS — I872 Venous insufficiency (chronic) (peripheral): Secondary | ICD-10-CM | POA: Diagnosis not present

## 2022-03-31 DIAGNOSIS — R2689 Other abnormalities of gait and mobility: Secondary | ICD-10-CM | POA: Diagnosis not present

## 2022-03-31 DIAGNOSIS — Z741 Need for assistance with personal care: Secondary | ICD-10-CM | POA: Diagnosis not present

## 2022-03-31 DIAGNOSIS — I619 Nontraumatic intracerebral hemorrhage, unspecified: Secondary | ICD-10-CM | POA: Diagnosis not present

## 2022-04-01 DIAGNOSIS — R4189 Other symptoms and signs involving cognitive functions and awareness: Secondary | ICD-10-CM | POA: Diagnosis not present

## 2022-04-01 DIAGNOSIS — I872 Venous insufficiency (chronic) (peripheral): Secondary | ICD-10-CM | POA: Diagnosis not present

## 2022-04-01 DIAGNOSIS — R2681 Unsteadiness on feet: Secondary | ICD-10-CM | POA: Diagnosis not present

## 2022-04-01 DIAGNOSIS — M6281 Muscle weakness (generalized): Secondary | ICD-10-CM | POA: Diagnosis not present

## 2022-04-01 DIAGNOSIS — I619 Nontraumatic intracerebral hemorrhage, unspecified: Secondary | ICD-10-CM | POA: Diagnosis not present

## 2022-04-01 DIAGNOSIS — Z741 Need for assistance with personal care: Secondary | ICD-10-CM | POA: Diagnosis not present

## 2022-04-01 DIAGNOSIS — R2689 Other abnormalities of gait and mobility: Secondary | ICD-10-CM | POA: Diagnosis not present

## 2022-04-01 DIAGNOSIS — R278 Other lack of coordination: Secondary | ICD-10-CM | POA: Diagnosis not present

## 2022-04-02 DIAGNOSIS — R2689 Other abnormalities of gait and mobility: Secondary | ICD-10-CM | POA: Diagnosis not present

## 2022-04-02 DIAGNOSIS — R4189 Other symptoms and signs involving cognitive functions and awareness: Secondary | ICD-10-CM | POA: Diagnosis not present

## 2022-04-02 DIAGNOSIS — R278 Other lack of coordination: Secondary | ICD-10-CM | POA: Diagnosis not present

## 2022-04-02 DIAGNOSIS — M6281 Muscle weakness (generalized): Secondary | ICD-10-CM | POA: Diagnosis not present

## 2022-04-02 DIAGNOSIS — R2681 Unsteadiness on feet: Secondary | ICD-10-CM | POA: Diagnosis not present

## 2022-04-02 DIAGNOSIS — I619 Nontraumatic intracerebral hemorrhage, unspecified: Secondary | ICD-10-CM | POA: Diagnosis not present

## 2022-04-02 DIAGNOSIS — Z741 Need for assistance with personal care: Secondary | ICD-10-CM | POA: Diagnosis not present

## 2022-04-02 DIAGNOSIS — I872 Venous insufficiency (chronic) (peripheral): Secondary | ICD-10-CM | POA: Diagnosis not present

## 2022-04-05 DIAGNOSIS — R278 Other lack of coordination: Secondary | ICD-10-CM | POA: Diagnosis not present

## 2022-04-05 DIAGNOSIS — R2681 Unsteadiness on feet: Secondary | ICD-10-CM | POA: Diagnosis not present

## 2022-04-05 DIAGNOSIS — I872 Venous insufficiency (chronic) (peripheral): Secondary | ICD-10-CM | POA: Diagnosis not present

## 2022-04-05 DIAGNOSIS — M6281 Muscle weakness (generalized): Secondary | ICD-10-CM | POA: Diagnosis not present

## 2022-04-05 DIAGNOSIS — R2689 Other abnormalities of gait and mobility: Secondary | ICD-10-CM | POA: Diagnosis not present

## 2022-04-05 DIAGNOSIS — Z741 Need for assistance with personal care: Secondary | ICD-10-CM | POA: Diagnosis not present

## 2022-04-05 DIAGNOSIS — R4189 Other symptoms and signs involving cognitive functions and awareness: Secondary | ICD-10-CM | POA: Diagnosis not present

## 2022-04-05 DIAGNOSIS — I619 Nontraumatic intracerebral hemorrhage, unspecified: Secondary | ICD-10-CM | POA: Diagnosis not present

## 2022-04-07 DIAGNOSIS — R278 Other lack of coordination: Secondary | ICD-10-CM | POA: Diagnosis not present

## 2022-04-07 DIAGNOSIS — M6281 Muscle weakness (generalized): Secondary | ICD-10-CM | POA: Diagnosis not present

## 2022-04-07 DIAGNOSIS — Z741 Need for assistance with personal care: Secondary | ICD-10-CM | POA: Diagnosis not present

## 2022-04-07 DIAGNOSIS — R2681 Unsteadiness on feet: Secondary | ICD-10-CM | POA: Diagnosis not present

## 2022-04-07 DIAGNOSIS — R2689 Other abnormalities of gait and mobility: Secondary | ICD-10-CM | POA: Diagnosis not present

## 2022-04-07 DIAGNOSIS — R4189 Other symptoms and signs involving cognitive functions and awareness: Secondary | ICD-10-CM | POA: Diagnosis not present

## 2022-04-07 DIAGNOSIS — I619 Nontraumatic intracerebral hemorrhage, unspecified: Secondary | ICD-10-CM | POA: Diagnosis not present

## 2022-04-07 DIAGNOSIS — I872 Venous insufficiency (chronic) (peripheral): Secondary | ICD-10-CM | POA: Diagnosis not present

## 2022-04-08 ENCOUNTER — Encounter: Payer: Self-pay | Admitting: Family Medicine

## 2022-04-08 ENCOUNTER — Ambulatory Visit: Payer: Medicare PPO | Admitting: Family Medicine

## 2022-04-08 VITALS — BP 102/60 | HR 86 | Temp 98.3°F | Ht 67.0 in | Wt 113.0 lb

## 2022-04-08 DIAGNOSIS — W19XXXS Unspecified fall, sequela: Secondary | ICD-10-CM

## 2022-04-08 DIAGNOSIS — R410 Disorientation, unspecified: Secondary | ICD-10-CM | POA: Diagnosis not present

## 2022-04-08 DIAGNOSIS — E875 Hyperkalemia: Secondary | ICD-10-CM

## 2022-04-08 DIAGNOSIS — E854 Organ-limited amyloidosis: Secondary | ICD-10-CM | POA: Diagnosis not present

## 2022-04-08 DIAGNOSIS — I680 Cerebral amyloid angiopathy: Secondary | ICD-10-CM

## 2022-04-08 NOTE — Patient Instructions (Signed)
STOP HCTZ  See NEURO ASAP  For further decline- seek emergent care

## 2022-04-09 ENCOUNTER — Encounter: Payer: Self-pay | Admitting: Family Medicine

## 2022-04-09 DIAGNOSIS — R2681 Unsteadiness on feet: Secondary | ICD-10-CM | POA: Diagnosis not present

## 2022-04-09 DIAGNOSIS — W19XXXA Unspecified fall, initial encounter: Secondary | ICD-10-CM | POA: Insufficient documentation

## 2022-04-09 DIAGNOSIS — Z741 Need for assistance with personal care: Secondary | ICD-10-CM | POA: Diagnosis not present

## 2022-04-09 DIAGNOSIS — E875 Hyperkalemia: Secondary | ICD-10-CM | POA: Insufficient documentation

## 2022-04-09 DIAGNOSIS — R278 Other lack of coordination: Secondary | ICD-10-CM | POA: Diagnosis not present

## 2022-04-09 DIAGNOSIS — R2689 Other abnormalities of gait and mobility: Secondary | ICD-10-CM | POA: Diagnosis not present

## 2022-04-09 DIAGNOSIS — R4189 Other symptoms and signs involving cognitive functions and awareness: Secondary | ICD-10-CM | POA: Diagnosis not present

## 2022-04-09 DIAGNOSIS — I872 Venous insufficiency (chronic) (peripheral): Secondary | ICD-10-CM | POA: Diagnosis not present

## 2022-04-09 DIAGNOSIS — I619 Nontraumatic intracerebral hemorrhage, unspecified: Secondary | ICD-10-CM | POA: Diagnosis not present

## 2022-04-09 DIAGNOSIS — M6281 Muscle weakness (generalized): Secondary | ICD-10-CM | POA: Diagnosis not present

## 2022-04-09 LAB — BASIC METABOLIC PANEL
BUN/Creatinine Ratio: 24 (ref 12–28)
BUN: 18 mg/dL (ref 8–27)
CO2: 20 mmol/L (ref 20–29)
Calcium: 9.2 mg/dL (ref 8.7–10.3)
Chloride: 87 mmol/L — ABNORMAL LOW (ref 96–106)
Creatinine, Ser: 0.74 mg/dL (ref 0.57–1.00)
Glucose: 126 mg/dL — ABNORMAL HIGH (ref 70–99)
Potassium: 4.6 mmol/L (ref 3.5–5.2)
Sodium: 122 mmol/L — ABNORMAL LOW (ref 134–144)
eGFR: 79 mL/min/{1.73_m2} (ref >59)

## 2022-04-09 NOTE — Assessment & Plan Note (Signed)
Fell 2 weeks ago; some tenderness noted to L shoulder with P decrease of ROM suspect partial rotator cuff tear Refer to ortho and neuro for further assistance Stop HCTZ in setting of worsening hyponatremia

## 2022-04-09 NOTE — Progress Notes (Signed)
.   Established patient visit  Patient: Courtney Willis   DOB: [birth year]   88 y.o. Female  MRN: [id number] Visit Date: 04/08/2022  Today's healthcare provider: Gwyneth Sprout, FNP  Introduced to nurse practitioner role and practice setting.  All questions answered.  Discussed provider/patient relationship and expectations.  Subjective    HPI HPI   Pt daughter--stated fell twice and hurt left shoulder--scrape,painful when lifting up the arm.--2 weeks Last edited by Elta Guadeloupe, CMA on 04/08/2022  1:59 PM.      Medications: Outpatient Medications Prior to Visit  Medication Sig   acetaminophen (TYLENOL) 500 MG tablet Take 500 mg by mouth every 6 (six) hours as needed.   Calcium Carbonate-Vit D-Min (CALCIUM 1200 PO) Take by mouth daily.    chlorpheniramine (CHLOR-TRIMETON) 4 MG tablet Take 4 mg by mouth as needed for allergies.   cholecalciferol (VITAMIN D3) 25 MCG (1000 UT) tablet Take 1,000 Units by mouth daily.   denosumab (PROLIA) 60 MG/ML SOSY injection Inject into the skin every 6 (six) months.   divalproex (DEPAKOTE ER) 500 MG 24 hr tablet Take 1 tablet (500 mg total) by mouth at bedtime.   losartan (COZAAR) 50 MG tablet Take 1 tablet (50 mg total) by mouth daily.   polyethylene glycol powder (GLYCOLAX/MIRALAX) powder Take 1 Container by mouth as needed.    Propylene Glycol (SYSTANE BALANCE) 0.6 % SOLN Apply to eye as needed.   sodium chloride (OCEAN) 0.65 % SOLN nasal spray Place 1 spray into both nostrils as needed for congestion.   timolol (TIMOPTIC) 0.5 % ophthalmic solution Place 1 drop into both eyes at bedtime.   [DISCONTINUED] hydrochlorothiazide (HYDRODIURIL) 25 MG tablet Take 1 tablet (25 mg total) by mouth daily.   No facility-administered medications prior to visit.    Review of Systems    Objective    BP 102/60 (BP Location: Right Arm, Patient Position: Sitting, Cuff Size: Normal)   Pulse 86   Temp 98.3 F (36.8 C)   Ht '5\' 7"'$  (1.702 m)   Wt 113 lb (51.3 kg)    SpO2 96%   BMI 17.70 kg/m   Physical Exam Vitals and nursing note reviewed.  Constitutional:      General: She is not in acute distress.    Appearance: Normal appearance. She is underweight. She is not ill-appearing, toxic-appearing or diaphoretic.  HENT:     Head: Normocephalic and atraumatic.  Cardiovascular:     Rate and Rhythm: Normal rate and regular rhythm.     Pulses: Normal pulses.     Heart sounds: Normal heart sounds. No murmur heard.    No friction rub. No gallop.  Pulmonary:     Effort: Pulmonary effort is normal. No respiratory distress.     Breath sounds: Normal breath sounds. No stridor. No wheezing, rhonchi or rales.  Chest:     Chest wall: No tenderness.  Abdominal:     General: Bowel sounds are normal.     Palpations: Abdomen is soft.  Musculoskeletal:        General: No swelling, tenderness, deformity or signs of injury. Normal range of motion.     Right lower leg: No edema.     Left lower leg: No edema.  Skin:    General: Skin is warm and dry.     Capillary Refill: Capillary refill takes less than 2 seconds.     Coloration: Skin is not jaundiced or pale.     Findings: No bruising, erythema, lesion  or rash.  Neurological:     General: No focal deficit present.     Mental Status: She is alert and oriented to person, place, and time. Mental status is at baseline.     Cranial Nerves: No cranial nerve deficit.     Sensory: No sensory deficit.     Motor: Weakness present.     Coordination: Coordination normal.     Gait: Gait abnormal.     Comments: 3/5 RUE 2/5 LUE 2/5 LLE 2/5 RLE Short, shuffling gait Looks at feet when walking No pronator drift Some confusion with following directions of nuero exam Denies sensory loss   Psychiatric:        Mood and Affect: Mood normal.        Behavior: Behavior normal.        Thought Content: Thought content normal.        Judgment: Judgment normal.     Results for orders placed or performed in visit on [id number]   Basic Metabolic Panel (BMET)  Result Value Ref Range   Glucose 126 (H) 70 - 99 mg/dL   BUN 18 8 - 27 mg/dL   Creatinine, Ser 0.74 0.57 - 1.00 mg/dL   eGFR 79 >59 mL/min/1.73   BUN/Creatinine Ratio 24 12 - 28   Sodium 122 (L) 134 - 144 mmol/L   Potassium 4.6 3.5 - 5.2 mmol/L   Chloride 87 (L) 96 - 106 mmol/L   CO2 20 20 - 29 mmol/L   Calcium 9.2 8.7 - 10.3 mg/dL    Assessment & Plan     Problem List Items Addressed This Visit       Cardiovascular and Mediastinum   Cerebral amyloid angiopathy ([place]) - Primary   Relevant Orders   Ambulatory referral to Neurology     Nervous and Auditory   Confusion    Chronic, worsening Sodium lower Due for neuro f/u Continue rollator to assist Encourage PO intake to assist; encourage salt added to food       Relevant Orders   Ambulatory referral to Neurology     Other   [place] 2 weeks ago; some tenderness noted to L shoulder with P decrease of ROM suspect partial rotator cuff tear Refer to ortho and neuro for further assistance Stop HCTZ in setting of worsening hyponatremia       Relevant Orders   Ambulatory referral to Neurology   Ambulatory referral to Orthopedic Surgery   Hyperkalemia    Acute, resolved Continue to monitor PO intake and diet Denies cardiac complaints at this time       Relevant Orders   Basic Metabolic Panel (BMET) (Completed)   Return if symptoms worsen or fail to improve.     Vonna Kotyk, FNP, have reviewed all documentation for this visit. The documentation on 04/09/22 for the exam, diagnosis, procedures, and orders are all accurate and complete.  Gwyneth Sprout, [place] [phone] (phone) [phone] (fax)  [place]

## 2022-04-09 NOTE — Progress Notes (Signed)
Worsening low sodium; please stop HCTZ as discussed. We have placed a call into pharmacy to assist.  Please let us know if you have any questions.  Thank you, Gwyneth Sprout, [address] [place], [place] [address] [phone] (phone) [phone] (fax) [place]

## 2022-04-09 NOTE — Assessment & Plan Note (Signed)
Chronic, worsening Sodium lower Due for neuro f/u Continue rollator to assist Encourage PO intake to assist; encourage salt added to food

## 2022-04-09 NOTE — Assessment & Plan Note (Signed)
Acute, resolved Continue to monitor PO intake and diet Denies cardiac complaints at this time

## 2022-04-12 DIAGNOSIS — R2681 Unsteadiness on feet: Secondary | ICD-10-CM | POA: Diagnosis not present

## 2022-04-12 DIAGNOSIS — M6281 Muscle weakness (generalized): Secondary | ICD-10-CM | POA: Diagnosis not present

## 2022-04-12 DIAGNOSIS — I872 Venous insufficiency (chronic) (peripheral): Secondary | ICD-10-CM | POA: Diagnosis not present

## 2022-04-12 DIAGNOSIS — R2689 Other abnormalities of gait and mobility: Secondary | ICD-10-CM | POA: Diagnosis not present

## 2022-04-12 DIAGNOSIS — R4189 Other symptoms and signs involving cognitive functions and awareness: Secondary | ICD-10-CM | POA: Diagnosis not present

## 2022-04-12 DIAGNOSIS — R278 Other lack of coordination: Secondary | ICD-10-CM | POA: Diagnosis not present

## 2022-04-12 DIAGNOSIS — I619 Nontraumatic intracerebral hemorrhage, unspecified: Secondary | ICD-10-CM | POA: Diagnosis not present

## 2022-04-12 DIAGNOSIS — Z741 Need for assistance with personal care: Secondary | ICD-10-CM | POA: Diagnosis not present

## 2022-04-15 DIAGNOSIS — I872 Venous insufficiency (chronic) (peripheral): Secondary | ICD-10-CM | POA: Diagnosis not present

## 2022-04-15 DIAGNOSIS — R278 Other lack of coordination: Secondary | ICD-10-CM | POA: Diagnosis not present

## 2022-04-15 DIAGNOSIS — I619 Nontraumatic intracerebral hemorrhage, unspecified: Secondary | ICD-10-CM | POA: Diagnosis not present

## 2022-04-15 DIAGNOSIS — Z741 Need for assistance with personal care: Secondary | ICD-10-CM | POA: Diagnosis not present

## 2022-04-15 DIAGNOSIS — R2689 Other abnormalities of gait and mobility: Secondary | ICD-10-CM | POA: Diagnosis not present

## 2022-04-15 DIAGNOSIS — R4189 Other symptoms and signs involving cognitive functions and awareness: Secondary | ICD-10-CM | POA: Diagnosis not present

## 2022-04-15 DIAGNOSIS — M6281 Muscle weakness (generalized): Secondary | ICD-10-CM | POA: Diagnosis not present

## 2022-04-15 DIAGNOSIS — R2681 Unsteadiness on feet: Secondary | ICD-10-CM | POA: Diagnosis not present

## 2022-04-19 ENCOUNTER — Ambulatory Visit: Payer: Medicare PPO | Admitting: Neurology

## 2022-04-19 ENCOUNTER — Encounter: Payer: Self-pay | Admitting: Neurology

## 2022-04-19 VITALS — BP 120/66 | HR 91 | Ht 67.0 in | Wt 114.0 lb

## 2022-04-19 DIAGNOSIS — R4189 Other symptoms and signs involving cognitive functions and awareness: Secondary | ICD-10-CM

## 2022-04-19 DIAGNOSIS — G40209 Localization-related (focal) (partial) symptomatic epilepsy and epileptic syndromes with complex partial seizures, not intractable, without status epilepticus: Secondary | ICD-10-CM | POA: Diagnosis not present

## 2022-04-19 DIAGNOSIS — R2689 Other abnormalities of gait and mobility: Secondary | ICD-10-CM | POA: Diagnosis not present

## 2022-04-19 DIAGNOSIS — Z8679 Personal history of other diseases of the circulatory system: Secondary | ICD-10-CM | POA: Diagnosis not present

## 2022-04-19 DIAGNOSIS — I619 Nontraumatic intracerebral hemorrhage, unspecified: Secondary | ICD-10-CM | POA: Diagnosis not present

## 2022-04-19 DIAGNOSIS — R2681 Unsteadiness on feet: Secondary | ICD-10-CM | POA: Diagnosis not present

## 2022-04-19 DIAGNOSIS — R278 Other lack of coordination: Secondary | ICD-10-CM | POA: Diagnosis not present

## 2022-04-19 DIAGNOSIS — Z741 Need for assistance with personal care: Secondary | ICD-10-CM | POA: Diagnosis not present

## 2022-04-19 DIAGNOSIS — I872 Venous insufficiency (chronic) (peripheral): Secondary | ICD-10-CM | POA: Diagnosis not present

## 2022-04-19 DIAGNOSIS — M6281 Muscle weakness (generalized): Secondary | ICD-10-CM | POA: Diagnosis not present

## 2022-04-19 NOTE — Progress Notes (Signed)
Chief Complaint  Patient presents with   Follow-up    Rm 15. Accompanied by daughter. More frequent falls, debility, increasing memory issues, weak LUA. Daughter states this is daily. She has fallen backwards twice. Daughter says since the first fall she has had a rapid progression, she is concerned she may have hit her head.   ASSESSMENT AND PLAN  Courtney Willis is a 88 y.o. female    History of left frontal intracranial hemorrhage in December 2019 Complex partial seizure,   MRI of brain  in December 2021 showed no acute abnormality, encephalomalacia left frontal lobe associated with  hemosiderin deposition, consistent with previous history of large left frontal intraparenchymal hemorrhage  EEG in April 2022 was normal  Keep Depakote ER 500mg  qhs.    Acute worsening of cognitive impairment and gait abnormality in February 2024  Mild worsening, MoCA examination 18/30  MRI of the brain to rule out new structural abnormality  Laboratory evaluation including UA, repeat sodium level  Return To Clinic With NP In 6 Months    DIAGNOSTIC DATA (LABS, IMAGING, TESTING) - I reviewed patient records, labs, notes, testing and imaging myself where available.  MRI of brain on Jan 14 2020:  Encephalomalacia in the left lobe with associated hemosiderin deposition consistent with prior history of left frontal intracranial hemorrhage, which was acute on Jan 13 2018. Moderate atrophy.   Courtney  [name] is a 88 year old female, seen in request by primary care PA Courtney Willis for evaluation of episode of acute aphasia, upper extremity tremor, initial evaluation was on January 01, 2020.  I reviewed and summarized the referring note.  Past medical history Hypertension Left frontal Intracranial bleeding  Patient suffered left frontal intracranial bleeding in December 2019, presented with difficulty speaking, right arm weakness, for extensive rehabilitation, regained significant function,  at baseline, 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 at [facility] [place], still drives.  October 22, 2019, while she was eating brunch with her friend, she suddenly developed slurred speech, there was described whole body tremor, lasting for few minutes, she was taken by her friend to emergency room, by then, she was able to make phone call to her son, she was back to her baseline  She denies similar episode in the past  I personally reviewed CT head without contrast on October 22, 2019, generalized atrophy, chronic left frontal infarction, no acute intracranial abnormality  CT angiogram of head and neck December 2019, no large vessel disease  MRI of the brain on January 13, 2018 large left frontal intraparenchymal hemorrhage, with 4 mm rightward midline shift,  Laboratory evaluation in 2021: CMP, creatinine of 0.6, CBC, hemoglobin of 12.9, INR 0.9, vitamin D 51, TSH 4.5,  She was put on Depakote ER 500 mg every night for her complex partial seizure since September 2021, repeat MRI of the brain in December 2021 showed encephalomalacia in the left frontal lobe associated with hemosiderin deposition consistent with her previous history of large left frontal intracranial hemorrhage, which was acute in December 2019, multiple morbid generalized atrophy  UPDATE Oct 6th 2022: She is overall doing very well, accompanied by her daughter at today's clinical visit, 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, retired Nurse, adult, traveled all over the world, still taking college classes regularly,  She had no recurrent seizure, taking Depakote ER 500 mg every night, Depakote level was 40 in March 2022, normal TSH, CBC CMP  Her daughter is concerned about her mild short-term memory loss, MoCA examination 23/30 today,  mild visual spatial disorientation, short-term memory loss, she still drives short distance on familiar route, denied lost while driving, daughter feels safe for driving short distance during daytime  now  UPDATE April 17th 2023: She is with her daughter Courtney Willis at today's visit,,  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, no recurrent seizure, tolerating Depakote ER 500 mg daily,  Daughter reported that she has mild worsening memory loss, she just got her license renewed for 5 years, she still drives short distance, goes to [employer] regularly, spent a lot of time with her friend  She retired as a Warden/ranger,  Daughter also reported that she has some personality change, again personally reviewed MRI of the brain in December 2021, encephalomalacia left frontal lobe with associated hemosiderin deposition, mild to moderate generalized atrophy  UPDATE Jan 12 2022: She is with her daughter at today's clinical visit, she was at [facility] independent living, was noted to have increased confusion since September 2023, repeating questions, feel frustrated easily, daughter reported yesterday she called her sometimes within 10 minutes to check on her mealtime,  She has no recurrent seizure, daughter also worried about she has increased about unsteady gait, change of posturing,  She was taken to emergency room November 13, 2021, personally reviewed CT head, chronic left frontal encephalomalacia, atrophy chronic small vessel changes  MoCA examination today was 22/30  UPDATE April 19 2022: She is accompanied by her daughter at today's clinical visit, in February she fell, noticed to have increased confusion, anxiety symptoms, moved to assisted living since then, no clinical seizure  Laboratory March 7 showed low sodium 122, dramatic drop from 131 as a baseline MoCA examination today is 18 out of 30  PHYSICAL EXAM   Vitals:   04/21/20 1518  BP: 124/73  Pulse: 93  Weight: 126 lb (57.2 kg)  Height: 5\' 8"  (1.727 m)   Body mass index is 19.16 kg/m.  PHYSICAL EXAMNIATION:  Gen: NAD, conversant, well nourised, well groomed           NEUROLOGICAL EXAM:  MENTAL  STATUS: Speech/cognition: Anxious looking elderly female, cooperative on examination, slow reaction time    04/19/2022    3:00 PM 01/12/2022    3:27 PM 05/18/2021    2:30 PM 11/06/2020    3:07 PM  Montreal Cognitive Assessment   Visuospatial/ Executive (0/5) 3 5 5 3   Naming (0/3) 3 3 3 3   Attention: Read list of digits (0/2) 2 1 1 2   Attention: Read list of letters (0/1) 1 1 1 1   Attention: Serial 7 subtraction starting at 100 (0/3) 0 1 3 1   Language: Repeat phrase (0/2) 2 2 1 2   Language : Fluency (0/1) 0 0 0 1  Abstraction (0/2) 2 2 1 2   Delayed Recall (0/5) 1 3 3 2   Orientation (0/6) 4 4 6 6   Total 18 22 24 23   Adjusted Score (based on education)  22 24 23     CRANIAL NERVES: CN II: Visual fields are full to confrontation. Pupils are round equal and briskly reactive to light. CN III, IV, VI: extraocular movement are normal. No ptosis. CN V: Facial sensation is intact to light touch CN VII: Face is symmetric with normal eye closure  CN VIII: Hearing is normal to causal conversation. CN IX, X: Phonation is normal. CN XI: Head turning and shoulder shrug are intact  MOTOR: Fixation of right upper extremity on rapid rotating movement, mild drift of right leg  REFLEXES: Reflexes  are 2+ and symmetric at the biceps, triceps, knees, and ankles. Plantar responses are flexor.  SENSORY: Intact to light touch, pinprick and vibratory sensation are intact in fingers and toes.  COORDINATION: There is no trunk or limb dysmetria noted.  GAIT/STANCE: She can get up from seated position arm crossed, slight unsteady, dragging right leg some decreased right arm swing   REVIEW OF SYSTEMS: Full 14 system review of systems performed and notable only for as above All other review of systems were negative.  ALLERGIES: No Known Allergies  HOME MEDICATIONS: Current Outpatient Medications  Medication Sig Dispense Refill   Calcium Carbonate-Vit D-Min (CALCIUM 1200 PO) Take by mouth daily.       cholecalciferol (VITAMIN D3) 25 MCG (1000 UT) tablet Take 1,000 Units by mouth daily.     divalproex (DEPAKOTE ER) 500 MG 24 hr tablet Take 1 tablet (500 mg total) by mouth at bedtime. 90 tablet 3   losartan (COZAAR) 50 MG tablet Take 1 tablet (50 mg total) by mouth daily. 30 tablet 11   timolol (TIMOPTIC) 0.5 % ophthalmic solution Place 1 drop into both eyes at bedtime.  5   No current facility-administered medications for this visit.    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   Post menopausal syndrome 08/12/2014   Stroke [facility])     PAST SURGICAL HISTORY: Past Surgical History:  Procedure Laterality Date   BUNIONECTOMY     05/2004, 2007   CATARACT EXTRACTION     04/2005, 06/2005   COLONOSCOPY W/ POLYPECTOMY     COLONOSCOPY WITH PROPOFOL N/A 02/15/2017   Procedure: COLONOSCOPY WITH PROPOFOL;  Surgeon: Jonathon Bellows, MD;  Location[facility] ENDOSCOPY;  Service: Gastroenterology;  Laterality: N/A;   HYSTEROSCOPY  2011   ORIF WRIST FRACTURE Right 08/30/2015   Procedure: OPEN REDUCTION INTERNAL FIXATION (ORIF) RIGHT WRIST FRACTURE AND REPAIR AS NECESSARY;  Surgeon: Roseanne Kaufman, MD;  Location: [place];  Service: Orthopedics;  Laterality: Right;   UPPER GI ENDOSCOPY     WRIST FRACTURE SURGERY Left    06/2006    FAMILY HISTORY: Family History  Problem Relation Age of Onset   Rheum arthritis Mother    Lung cancer Father    Ulcers Father    Hodgkin's lymphoma Sister    AVM Daughter    Breast cancer Paternal Grandmother     SOCIAL HISTORY: Social  History   Socioeconomic History   Marital status: Divorced    Spouse name: Not on file   Number of children: 2   Years of education: college   Highest education level: Master's degree (e.g., MA, MS, MEng, MEd, MSW, MBA)  Occupational History   Occupation: retired  Tobacco Use   Smoking status: Former   Smokeless tobacco: Never   Tobacco comments:    (08/29/15) quit 40 years ago  Vaping Use   Vaping Use: Never used  Substance and Sexual Activity   Alcohol use: Yes    Alcohol/week: 0.0 standard drinks of alcohol    Comment: occasional  Drug use: No   Sexual activity: Not on file  Other Topics Concern   Not on file  Social History Narrative   Lives alone.   Right-handed.   Caffeine use: 1 cup in the morning and sometimes a decaf cup in the afternoon   Social Determinants of Health   Financial Resource Strain: Low Risk  (07/13/2017)   Overall Financial Resource Strain (CARDIA)    Difficulty of Paying Living Expenses: Not hard at all  Food Insecurity: No Food Insecurity (07/13/2017)   Hunger Vital Sign    Worried About Running Out of Food in the Last Year: Never true    Ran Out of Food in the Last Year: Never true  Transportation Needs: No Transportation Needs (07/13/2017)   PRAPARE - Hydrologist (Medical): No    Lack of Transportation (Non-Medical): No  Physical Activity: Inactive (07/25/2018)   Exercise Vital Sign    Days of Exercise per Week: 0 days    Minutes of Exercise per Session: 0 min  Stress: No Stress Concern Present (07/25/2018)   [place]    Feeling of Stress : Only a little  Social Connections: Unknown (07/25/2018)   Social Connection and Isolation Panel [NHANES]    Frequency of Communication with Friends and Family: Patient declined    Frequency of Social Gatherings with Friends and Family: Patient declined    Attends Religious Services: Patient declined     Marine scientist or Organizations: Patient declined    Attends Archivist Meetings: Patient declined    Marital Status: Patient declined  Intimate Partner Violence: Unknown (07/25/2018)   Humiliation, Afraid, Rape, and Kick questionnaire    Fear of Current or Ex-Partner: Patient declined    Emotionally Abused: Patient declined    Physically Abused: Patient declined    Sexually Abused: Patient declined    Marcial Pacas, M.D. Ph.D.  [facility] [address] [place], [place] [address] Ph: [phone] Fax: [phone]  CC:  Mar Daring, PA-C [address] [place],  [place] [address]

## 2022-04-20 ENCOUNTER — Telehealth: Payer: Self-pay | Admitting: Neurology

## 2022-04-20 DIAGNOSIS — M6281 Muscle weakness (generalized): Secondary | ICD-10-CM | POA: Diagnosis not present

## 2022-04-20 DIAGNOSIS — R2681 Unsteadiness on feet: Secondary | ICD-10-CM | POA: Diagnosis not present

## 2022-04-20 DIAGNOSIS — Z741 Need for assistance with personal care: Secondary | ICD-10-CM | POA: Diagnosis not present

## 2022-04-20 DIAGNOSIS — I872 Venous insufficiency (chronic) (peripheral): Secondary | ICD-10-CM | POA: Diagnosis not present

## 2022-04-20 DIAGNOSIS — R4189 Other symptoms and signs involving cognitive functions and awareness: Secondary | ICD-10-CM | POA: Diagnosis not present

## 2022-04-20 DIAGNOSIS — R2689 Other abnormalities of gait and mobility: Secondary | ICD-10-CM | POA: Diagnosis not present

## 2022-04-20 DIAGNOSIS — I619 Nontraumatic intracerebral hemorrhage, unspecified: Secondary | ICD-10-CM | POA: Diagnosis not present

## 2022-04-20 DIAGNOSIS — R278 Other lack of coordination: Secondary | ICD-10-CM | POA: Diagnosis not present

## 2022-04-20 LAB — URINALYSIS, ROUTINE W REFLEX MICROSCOPIC
Bilirubin, UA: NEGATIVE
Nitrite, UA: NEGATIVE
Protein,UA: NEGATIVE
RBC, UA: NEGATIVE
Specific Gravity, UA: 1.014 (ref 1.005–1.030)
Urobilinogen, Ur: 0.2 mg/dL (ref 0.2–1.0)
pH, UA: 6.5 (ref 5.0–7.5)

## 2022-04-20 LAB — MICROSCOPIC EXAMINATION
Bacteria, UA: NONE SEEN
Casts: NONE SEEN /lpf

## 2022-04-20 NOTE — Telephone Encounter (Signed)
Mcarthur Rossetti Josem Kaufmann: [id number] exp. 04/20/22-05/20/22 sent to GI [phone]

## 2022-04-21 ENCOUNTER — Telehealth: Payer: Self-pay | Admitting: Neurology

## 2022-04-21 DIAGNOSIS — R278 Other lack of coordination: Secondary | ICD-10-CM | POA: Diagnosis not present

## 2022-04-21 DIAGNOSIS — I872 Venous insufficiency (chronic) (peripheral): Secondary | ICD-10-CM | POA: Diagnosis not present

## 2022-04-21 DIAGNOSIS — Z8679 Personal history of other diseases of the circulatory system: Secondary | ICD-10-CM

## 2022-04-21 DIAGNOSIS — Z741 Need for assistance with personal care: Secondary | ICD-10-CM | POA: Diagnosis not present

## 2022-04-21 DIAGNOSIS — M6281 Muscle weakness (generalized): Secondary | ICD-10-CM | POA: Diagnosis not present

## 2022-04-21 DIAGNOSIS — G40209 Localization-related (focal) (partial) symptomatic epilepsy and epileptic syndromes with complex partial seizures, not intractable, without status epilepticus: Secondary | ICD-10-CM

## 2022-04-21 DIAGNOSIS — R4189 Other symptoms and signs involving cognitive functions and awareness: Secondary | ICD-10-CM | POA: Diagnosis not present

## 2022-04-21 DIAGNOSIS — I619 Nontraumatic intracerebral hemorrhage, unspecified: Secondary | ICD-10-CM | POA: Diagnosis not present

## 2022-04-21 DIAGNOSIS — R2681 Unsteadiness on feet: Secondary | ICD-10-CM | POA: Diagnosis not present

## 2022-04-21 DIAGNOSIS — R2689 Other abnormalities of gait and mobility: Secondary | ICD-10-CM | POA: Diagnosis not present

## 2022-04-21 LAB — COMPREHENSIVE METABOLIC PANEL
ALT: 10 IU/L (ref 0–32)
AST: 17 IU/L (ref 0–40)
Albumin/Globulin Ratio: 2.2 (ref 1.2–2.2)
Albumin: 4.2 g/dL (ref 3.7–4.7)
Alkaline Phosphatase: 125 IU/L — ABNORMAL HIGH (ref 44–121)
BUN/Creatinine Ratio: 17 (ref 12–28)
BUN: 16 mg/dL (ref 8–27)
Bilirubin Total: 0.5 mg/dL (ref 0.0–1.2)
CO2: 23 mmol/L (ref 20–29)
Calcium: 9.5 mg/dL (ref 8.7–10.3)
Chloride: 89 mmol/L — ABNORMAL LOW (ref 96–106)
Creatinine, Ser: 0.95 mg/dL (ref 0.57–1.00)
Globulin, Total: 1.9 g/dL (ref 1.5–4.5)
Glucose: 90 mg/dL (ref 70–99)
Potassium: 4.6 mmol/L (ref 3.5–5.2)
Sodium: 128 mmol/L — ABNORMAL LOW (ref 134–144)
Total Protein: 6.1 g/dL (ref 6.0–8.5)
eGFR: 59 mL/min/{1.73_m2} — ABNORMAL LOW (ref >59)

## 2022-04-21 LAB — VALPROIC ACID LEVEL: Valproic Acid Lvl: 44 ug/mL — ABNORMAL LOW (ref 50–100)

## 2022-04-21 MED ORDER — LAMOTRIGINE 25 MG PO TABS: ORAL_TABLET | ORAL | 0 refills | Status: DC

## 2022-04-21 MED ORDER — LAMOTRIGINE 100 MG PO TABS: 100.0000 mg | ORAL_TABLET | Freq: Two times a day (BID) | ORAL | 11 refills | Status: AC

## 2022-04-21 MED ORDER — NITROFURANTOIN MONOHYD MACRO 100 MG PO CAPS: 100.0000 mg | ORAL_CAPSULE | Freq: Two times a day (BID) | ORAL | 0 refills | Status: DC

## 2022-04-21 NOTE — Telephone Encounter (Signed)
I called her daughter, UA showed mild UTI, starting Macrobid 100 mg twice a day for 5 days,  Laboratory evaluation continue to show low sodium 128, mild improvement compared to previous 122  Elevated alkaline phosphate 125,  She is taking Depakote ER 500 mg every night, might contributed to her abnormal liver functional test  Discussed with daughter, will change her to lamotrigine,  Meds ordered this encounter  Medications   nitrofurantoin, macrocrystal-monohydrate, (MACROBID) 100 MG capsule    Sig: Take 1 capsule (100 mg total) by mouth 2 (two) times daily.    Dispense:  10 capsule    Refill:  0   lamoTRIgine (LAMICTAL) 25 MG tablet    Sig: weeks Depakote   Lamotrigine 25mg  1st 500mg /500mg  1/1 2nd 0/500mg  2/2 3rd 0/0 3/3 4th 0/0 4/4   Lamotrigine 100mg  twice a day    Dispense:  84 tablet    Refill:  0   lamoTRIgine (LAMICTAL) 100 MG tablet    Sig: Take 1 tablet (100 mg total) by mouth 2 (two) times daily.    Dispense:  60 tablet    Refill:  11     Week Lamotrigine Depakote ER 500mg   1st week 25mg  twice a day 1 tab every night  2nd week 25mg x2 tab twice aday Stop  3rd week 25mg x3tab twice aday 0  4th week 100mg  twice a day 0

## 2022-04-21 NOTE — Patient Instructions (Addendum)
   I called her daughter, UA showed mild UTI, starting Macrobid 100 mg twice a day for 5 days,   Laboratory evaluation continue to show low sodium 128, mild improvement compared to previous 122   Elevated alkaline phosphate 125,   She is taking Depakote ER 500 mg every night, might contributed to her abnormal liver functional test   Discussed with daughter, will change her to lamotrigine,       Meds ordered this encounter  Medications   nitrofurantoin, macrocrystal-monohydrate, (MACROBID) 100 MG capsule      Sig: Take 1 capsule (100 mg total) by mouth 2 (two) times daily.      Dispense:  10 capsule      Refill:  0   lamoTRIgine (LAMICTAL) 25 MG tablet      Sig: weeks Depakote            Lamotrigine 25mg  1st       500mg /500mg             1/1 2nd      0/500mg             2/2 3rd       0/0            3/3 4th       0/0            4/4                         Lamotrigine 100mg  twice a day      Dispense:  84 tablet      Refill:  0   lamoTRIgine (LAMICTAL) 100 MG tablet      Sig: Take 1 tablet (100 mg total) by mouth 2 (two) times daily.      Dispense:  60 tablet      Refill:  11        Please be aware, she has two Lamotrigine Rx in her pharmacy, 25mg  and 100mg    Please start 25mg  tab first according to titration plan listed above, if she develops a rash, please call back my office, and stop the medicine immediately.  After she finish lamotrigine 25 mg titration plan, she should only taking lamotrigine 100 mg twice a day after that

## 2022-04-22 DIAGNOSIS — M6281 Muscle weakness (generalized): Secondary | ICD-10-CM | POA: Diagnosis not present

## 2022-04-22 DIAGNOSIS — R2681 Unsteadiness on feet: Secondary | ICD-10-CM | POA: Diagnosis not present

## 2022-04-22 DIAGNOSIS — R2689 Other abnormalities of gait and mobility: Secondary | ICD-10-CM | POA: Diagnosis not present

## 2022-04-22 DIAGNOSIS — I619 Nontraumatic intracerebral hemorrhage, unspecified: Secondary | ICD-10-CM | POA: Diagnosis not present

## 2022-04-22 DIAGNOSIS — Z741 Need for assistance with personal care: Secondary | ICD-10-CM | POA: Diagnosis not present

## 2022-04-22 DIAGNOSIS — I872 Venous insufficiency (chronic) (peripheral): Secondary | ICD-10-CM | POA: Diagnosis not present

## 2022-04-22 DIAGNOSIS — R4189 Other symptoms and signs involving cognitive functions and awareness: Secondary | ICD-10-CM | POA: Diagnosis not present

## 2022-04-22 DIAGNOSIS — R278 Other lack of coordination: Secondary | ICD-10-CM | POA: Diagnosis not present

## 2022-04-24 ENCOUNTER — Emergency Department: Payer: Medicare PPO

## 2022-04-24 ENCOUNTER — Emergency Department
Admission: EM | Admit: 2022-04-24 | Discharge: 2022-04-24 | Disposition: A | Discharge status: Skilled Nursing Facility | Payer: Medicare PPO | Attending: Emergency Medicine | Admitting: Emergency Medicine

## 2022-04-24 ENCOUNTER — Other Ambulatory Visit: Payer: Self-pay

## 2022-04-24 DIAGNOSIS — Y92002 Bathroom of unspecified non-institutional (private) residence single-family (private) house as the place of occurrence of the external cause: Secondary | ICD-10-CM | POA: Insufficient documentation

## 2022-04-24 DIAGNOSIS — S0990XA Unspecified injury of head, initial encounter: Secondary | ICD-10-CM | POA: Diagnosis not present

## 2022-04-24 DIAGNOSIS — W19XXXA Unspecified fall, initial encounter: Secondary | ICD-10-CM | POA: Diagnosis not present

## 2022-04-24 DIAGNOSIS — Z043 Encounter for examination and observation following other accident: Secondary | ICD-10-CM | POA: Diagnosis not present

## 2022-04-24 DIAGNOSIS — S0101XA Laceration without foreign body of scalp, initial encounter: Secondary | ICD-10-CM | POA: Diagnosis not present

## 2022-04-24 DIAGNOSIS — I10 Essential (primary) hypertension: Secondary | ICD-10-CM | POA: Insufficient documentation

## 2022-04-24 DIAGNOSIS — S0181XA Laceration without foreign body of other part of head, initial encounter: Secondary | ICD-10-CM | POA: Diagnosis not present

## 2022-04-24 DIAGNOSIS — S06360A Traumatic hemorrhage of cerebrum, unspecified, without loss of consciousness, initial encounter: Secondary | ICD-10-CM | POA: Diagnosis not present

## 2022-04-24 DIAGNOSIS — Y93E1 Activity, personal bathing and showering: Secondary | ICD-10-CM | POA: Insufficient documentation

## 2022-04-24 DIAGNOSIS — R5381 Other malaise: Secondary | ICD-10-CM | POA: Diagnosis not present

## 2022-04-24 DIAGNOSIS — W182XXA Fall in (into) shower or empty bathtub, initial encounter: Secondary | ICD-10-CM | POA: Diagnosis not present

## 2022-04-24 DIAGNOSIS — Z79899 Other long term (current) drug therapy: Secondary | ICD-10-CM | POA: Diagnosis not present

## 2022-04-24 MED ORDER — ACETAMINOPHEN 325 MG PO TABS
650.0000 mg | ORAL_TABLET | Freq: Once | ORAL | Status: AC
  Administered 2022-04-24: 650 mg via ORAL
  Filled 2022-04-24: qty 2

## 2022-04-24 NOTE — ED Provider Notes (Signed)
[facility] Emergency Department Provider Note     Event Date/Time   First MD Initiated Contact with Patient 04/24/22 1122     (approximate)   History   Fall   HPI  Courtney Willis is a 88 y.o. female with a history of stroke, anemia, and hypertension [place] to the ED via EMS from [facility] facility.  Patient apparently had a fall in the shower.  Patient denies any loss of consciousness is not clear why she fell.  She does present with a laceration to the right side of her forehead.  She denies any other injury related to the fall.   Physical Exam   Triage Vital Signs: ED Triage Vitals  Enc Vitals Group     BP 04/24/22 1111 121/73     Pulse Rate 04/24/22 1111 82     Resp 04/24/22 1111 18     Temp 04/24/22 1111 98.2 F (36.8 C)     Temp src --      SpO2 04/24/22 1111 92 %     Weight --      Height --      Head Circumference --      Peak Flow --      Pain Score 04/24/22 1112 10     Pain Loc --      Pain Edu? --      Excl. in [place]? --     Most recent vital signs: Vitals:   04/24/22 1111  BP: 121/73  Pulse: 82  Resp: 18  Temp: 98.2 F (36.8 C)  SpO2: 92%    General Awake, no distress. NAD HEENT NCAT, except for a 3 cm laceration to the frontal scalp.  No active bleeding noted at this time. PERRL. EOMI. No rhinorrhea. Mucous membranes are moist.  CV:  Good peripheral perfusion.  RESP:  Normal effort.  ABD:  No distention.  MSK:  Full active range of motion of the upper and lower extremities.   ED Results / Procedures / Treatments   Labs (all labs ordered are listed, but only abnormal results are displayed) Labs Reviewed - No data to display   EKG   RADIOLOGY  I personally viewed and evaluated these images as part of my medical decision making, as well as reviewing the written report by the radiologist.  ED Provider Interpretation: No acute findings  CT Cervical Spine Wo Contrast  Result Date: 04/24/2022 CLINICAL DATA:   88 year old female status post fall in shower. EXAM: CT CERVICAL SPINE WITHOUT CONTRAST TECHNIQUE: Multidetector CT imaging of the cervical spine was performed without intravenous contrast. Multiplanar CT image reconstructions were also generated. RADIATION DOSE REDUCTION: This exam was performed according to the departmental dose-optimization program which includes automated exposure control, adjustment of the mA and/or kV according to patient size and/or use of iterative reconstruction technique. COMPARISON:  Head CT today.  Prior CTA neck 01/14/2018. FINDINGS: Alignment: Straightening of cervical lordosis and subtle degenerative retrolisthesis of C4 on C5, C5 on C6 are stable since the 2019 CTA. Cervicothoracic junction alignment is within normal limits. Bilateral posterior element alignment is within normal limits. Skull base and vertebrae: Chronic osteopenia. Visualized skull base is intact. No atlanto-occipital dissociation. C1 and C2 appear intact and aligned. No acute osseous abnormality identified. Soft tissues and spinal canal: No prevertebral fluid or swelling. No visible canal hematoma. Capacious spinal canal. Negative visible noncontrast neck soft tissues. Disc levels: Chronic cervical spine degeneration, advanced at C4-C5 but mild for age  otherwise, and underlying Capacious spinal canal. No CT evidence of spinal stenosis. Upper chest: Visible upper thoracic vertebrae appears stable with osteopenia. But a healed chronic posterior left 4th rib fracture appears new since 2019. Chronic Mild apical lung scarring. IMPRESSION: 1. No acute traumatic injury identified in the cervical spine. 2. Osteopenia. A chronic appearing posterior left 4th rib fracture is new since 2019. Electronically Signed   By: Genevie Ann M.D.   On: 04/24/2022 12:23   CT HEAD WO CONTRAST (5MM)  Result Date: 04/24/2022 CLINICAL DATA:  88 year old female status post fall in shower. History of a 2019 anterior left frontal lobe intra-axial  hemorrhage. EXAM: CT HEAD WITHOUT CONTRAST TECHNIQUE: Contiguous axial images were obtained from the base of the skull through the vertex without intravenous contrast. RADIATION DOSE REDUCTION: This exam was performed according to the departmental dose-optimization program which includes automated exposure control, adjustment of the mA and/or kV according to patient size and/or use of iterative reconstruction technique. COMPARISON:  Brain MRI 01/14/2020.  Head CT 11/13/2021. FINDINGS: Brain: Chronic encephalomalacia, with hemosiderin/superficial siderosis demonstrated on 2021 MRI) in the anterior left frontal lobe. Associated dystrophic calcification (series 2, image 21) these findings are chronic and stable. No superimposed midline shift, ventriculomegaly, mass effect, evidence of mass lesion, acute No suspicious intracranial vascular hyperdensity. intracranial hemorrhage or evidence of cortically based acute infarction. Stable gray-white matter differentiation throughout the brain. Vascular: Calcified atherosclerosis at the skull base. Skull: Osteopenia.  No acute osseous abnormality identified. Sinuses/Orbits: Visualized paranasal sinuses and mastoids are stable and well aerated. Other: No acute orbit or scalp soft tissue injury identified. IMPRESSION: 1. No acute intracranial abnormality or acute traumatic injury identified. 2. Chronic left frontal lobe encephalomalacia. Electronically Signed   By: Genevie Ann M.D.   On: 04/24/2022 12:19     PROCEDURES:  Critical Care performed: No  ..Laceration Repair  Date/Time: 04/24/2022 11:39 AM  Performed by: Melvenia Needles, PA-C Authorized by: Melvenia Needles, PA-C   Consent:    Consent obtained:  Verbal   Consent given by:  Patient   Risks, benefits, and alternatives were discussed: yes     Risks discussed:  Pain   Alternatives discussed:  No treatment Universal protocol:    Imaging studies available: yes     Patient identity confirmed:   Verbally with patient Anesthesia:    Anesthesia method:  Topical application   Topical anesthesia: tetracaine solution. Laceration details:    Location:  Scalp   Scalp location:  Frontal   Length (cm):  3   Depth (mm):  3 Exploration:    Limited defect created (wound extended): no     Contaminated: no   Treatment:    Area cleansed with:  Saline   Amount of cleaning:  Standard   Irrigation solution:  Sterile saline   Irrigation volume:  10   Irrigation method:  Tap   Debridement:  None   Undermining:  None   Scar revision: no   Skin repair:    Repair method:  Staples   Number of staples:  3 Approximation:    Approximation:  Close Repair type:    Repair type:  Simple Post-procedure details:    Dressing:  Open (no dressing)   Procedure completion:  Tolerated well, no immediate complications    MEDICATIONS ORDERED IN ED: Medications  acetaminophen (TYLENOL) tablet 650 mg (650 mg Oral Given 04/24/22 1149)     IMPRESSION / MDM / [place] / ED COURSE  I reviewed the triage vital signs and the nursing notes.                              Differential diagnosis includes, but is not limited to, SDH, concussion, skull fracture, cervical spine fracture, cervical dislocation, compression fracture  Patient's presentation is most consistent with acute complicated illness / injury requiring diagnostic workup.  Patient's diagnosis is consistent with mechanical fall resulting in minor head injury and scalp laceration.  Presents from her facility after a fall in the shower denies any significant head trauma or loss of consciousness but she does have a laceration to the frontal scalp, which was repaired using staples.  Patient's imaging of the head and neck are reassuring as it showed no acute intracranial or spinous processes, based on my interpretation.  Patient will be discharged home with actions to take OTC Tylenol as needed. Patient is to follow up with primary provider as  needed or otherwise directed. Patient is given ED precautions to return to the ED for any worsening or new symptoms.   FINAL CLINICAL IMPRESSION(S) / ED DIAGNOSES   Final diagnoses:  Fall in home, initial encounter  Minor head injury, initial encounter  Laceration of scalp, initial encounter     Rx / DC Orders   ED Discharge Orders     None        Note:  This document was prepared using Dragon voice recognition software and may include unintentional dictation errors.    Melvenia Needles, PA-C 04/24/22 1240    Lavonia Drafts, MD 04/24/22 [phone]

## 2022-04-24 NOTE — ED Notes (Signed)
[facility] has been contacted and are trying to arrange transport.

## 2022-04-24 NOTE — ED Notes (Signed)
[facility] will come and pick the patient up. Patient was assisted to hallway bathroom with 1 assist. Patient is unsteady while walking.

## 2022-04-24 NOTE — ED Notes (Signed)
Patient is resting on stretcher, waiting for transport. Patient's glasses are on, cell phone and discharge instructions with DNR at bedside.

## 2022-04-24 NOTE — ED Notes (Signed)
Call was made to patient's daughter for transport home. Daughter is unsure if she will be able to come pick her up. Patient is talking with daughter at this time.

## 2022-04-24 NOTE — ED Triage Notes (Signed)
Pt in via EMS from her apartment at [facility] with c/o falling in shower. Denies LOC, unsure why she fell. Pt with laceration to right side of forehead, bleeding controlled at this time. Denies all other injuries.   151/85, 99% RA, HR 74

## 2022-04-26 ENCOUNTER — Telehealth: Payer: Self-pay

## 2022-04-26 NOTE — Telephone Encounter (Signed)
Copied from [place] [id number]. Topic: General - Other >> Apr 26, 2022  1:31 PM Sabas Sous wrote: Reason for CRM: [facility] faxed over an [facility] about 45 minutes ago, he is asking for a signature and fax back today. They need the PCP signature prior to moving her to a different facility at [facility], she is set to do this today pending fax back with provider signature.   Fax number: [phone]

## 2022-04-27 DIAGNOSIS — R2681 Unsteadiness on feet: Secondary | ICD-10-CM | POA: Diagnosis not present

## 2022-04-27 DIAGNOSIS — R4189 Other symptoms and signs involving cognitive functions and awareness: Secondary | ICD-10-CM | POA: Diagnosis not present

## 2022-04-27 DIAGNOSIS — M6281 Muscle weakness (generalized): Secondary | ICD-10-CM | POA: Diagnosis not present

## 2022-04-27 DIAGNOSIS — R278 Other lack of coordination: Secondary | ICD-10-CM | POA: Diagnosis not present

## 2022-04-27 DIAGNOSIS — I619 Nontraumatic intracerebral hemorrhage, unspecified: Secondary | ICD-10-CM | POA: Diagnosis not present

## 2022-04-27 DIAGNOSIS — R2689 Other abnormalities of gait and mobility: Secondary | ICD-10-CM | POA: Diagnosis not present

## 2022-04-27 DIAGNOSIS — Z741 Need for assistance with personal care: Secondary | ICD-10-CM | POA: Diagnosis not present

## 2022-04-27 DIAGNOSIS — I872 Venous insufficiency (chronic) (peripheral): Secondary | ICD-10-CM | POA: Diagnosis not present

## 2022-04-30 DIAGNOSIS — I872 Venous insufficiency (chronic) (peripheral): Secondary | ICD-10-CM | POA: Diagnosis not present

## 2022-04-30 DIAGNOSIS — I619 Nontraumatic intracerebral hemorrhage, unspecified: Secondary | ICD-10-CM | POA: Diagnosis not present

## 2022-04-30 DIAGNOSIS — R4189 Other symptoms and signs involving cognitive functions and awareness: Secondary | ICD-10-CM | POA: Diagnosis not present

## 2022-04-30 DIAGNOSIS — M6281 Muscle weakness (generalized): Secondary | ICD-10-CM | POA: Diagnosis not present

## 2022-04-30 DIAGNOSIS — R278 Other lack of coordination: Secondary | ICD-10-CM | POA: Diagnosis not present

## 2022-04-30 DIAGNOSIS — Z741 Need for assistance with personal care: Secondary | ICD-10-CM | POA: Diagnosis not present

## 2022-04-30 DIAGNOSIS — R2681 Unsteadiness on feet: Secondary | ICD-10-CM | POA: Diagnosis not present

## 2022-04-30 DIAGNOSIS — R2689 Other abnormalities of gait and mobility: Secondary | ICD-10-CM | POA: Diagnosis not present

## 2022-05-04 ENCOUNTER — Telehealth: Payer: Self-pay

## 2022-05-04 ENCOUNTER — Telehealth: Payer: Self-pay | Admitting: Neurology

## 2022-05-04 DIAGNOSIS — R4189 Other symptoms and signs involving cognitive functions and awareness: Secondary | ICD-10-CM

## 2022-05-04 DIAGNOSIS — Z8679 Personal history of other diseases of the circulatory system: Secondary | ICD-10-CM

## 2022-05-04 DIAGNOSIS — R4701 Aphasia: Secondary | ICD-10-CM

## 2022-05-04 DIAGNOSIS — W19XXXS Unspecified fall, sequela: Secondary | ICD-10-CM

## 2022-05-04 DIAGNOSIS — G40209 Localization-related (focal) (partial) symptomatic epilepsy and epileptic syndromes with complex partial seizures, not intractable, without status epilepticus: Secondary | ICD-10-CM

## 2022-05-04 NOTE — Telephone Encounter (Signed)
Pt daughter called. Stated she needs to talk with Dr. Krista Blue about pt health. She is requesting a call back from nurse

## 2022-05-04 NOTE — Telephone Encounter (Signed)
Called and spoke to daughter, states patient is progressing rapidly. Patient was using a walker and communicating at last appointment according to daughter who came with her. Now she states she cannot walk due to being off balance and now has been placed in the memory unit at her facility. She cannot talk clearly and has been nodding off and constantly keeping her eyes closed. Daughter states she was very concerned with her hair and looking well each day and now she does not care at all. Daughter is requesting a call from Dr. Krista Blue about possibly cancelling MRI and recommendations on what next steps should be and whether or not this is normal. Will forward to Dr. Krista Blue for review. Daughter aware Dr. Krista Blue is out of office and wanted to wait for her advice

## 2022-05-04 NOTE — Telephone Encounter (Signed)
See other phone note

## 2022-05-05 NOTE — Telephone Encounter (Signed)
Was able to call her daughter [name], reported patient continued to decline rapidly, over the past 5 weeks, from able to ambulate with walker to nonambulatory, upgrade from independent living to assisted living now at nursing care,  Fall, went to emergency room on April 24, 2022, personally reviewed CT head without contrast, no acute abnormality, chronic left frontal encephalomalacia  CT cervical spine showed multilevel degenerative changes, osteoporosis anemia, no acute traumatic injury  Laboratory evaluations on March 19 showed UTI, was given Macrobid, no change with antibiotic treatment  Laboratory on March 20 showed sodium 128, chloride 89, alkaline phosphate was mildly elevated 125, Depakote level 44  Discussed with [name] about further evaluation to rule out treatable etiology, such as MRI of the brain to rule out new structural abnormality, repeat EEG to rule out subclinical seizure, and the more extensive laboratory evaluation from rule out metabolic disarrangement  [name] decided to discuss with her brother, concerned about her mother's age, long-term progress, is very hesitant to go through more evaluation at this point

## 2022-05-07 DIAGNOSIS — R278 Other lack of coordination: Secondary | ICD-10-CM | POA: Diagnosis not present

## 2022-05-07 DIAGNOSIS — Z741 Need for assistance with personal care: Secondary | ICD-10-CM | POA: Diagnosis not present

## 2022-05-07 DIAGNOSIS — I619 Nontraumatic intracerebral hemorrhage, unspecified: Secondary | ICD-10-CM | POA: Diagnosis not present

## 2022-05-07 DIAGNOSIS — I872 Venous insufficiency (chronic) (peripheral): Secondary | ICD-10-CM | POA: Diagnosis not present

## 2022-05-07 DIAGNOSIS — R2689 Other abnormalities of gait and mobility: Secondary | ICD-10-CM | POA: Diagnosis not present

## 2022-05-07 DIAGNOSIS — R2681 Unsteadiness on feet: Secondary | ICD-10-CM | POA: Diagnosis not present

## 2022-05-07 DIAGNOSIS — M6281 Muscle weakness (generalized): Secondary | ICD-10-CM | POA: Diagnosis not present

## 2022-05-07 DIAGNOSIS — R4189 Other symptoms and signs involving cognitive functions and awareness: Secondary | ICD-10-CM | POA: Diagnosis not present

## 2022-05-13 ENCOUNTER — Other Ambulatory Visit: Payer: Medicare PPO

## 2022-05-17 ENCOUNTER — Telehealth: Payer: Self-pay | Admitting: Neurology

## 2022-05-17 ENCOUNTER — Other Ambulatory Visit: Payer: Self-pay | Admitting: Family Medicine

## 2022-05-17 DIAGNOSIS — R2689 Other abnormalities of gait and mobility: Secondary | ICD-10-CM | POA: Diagnosis not present

## 2022-05-17 DIAGNOSIS — I619 Nontraumatic intracerebral hemorrhage, unspecified: Secondary | ICD-10-CM | POA: Diagnosis not present

## 2022-05-17 DIAGNOSIS — Z741 Need for assistance with personal care: Secondary | ICD-10-CM | POA: Diagnosis not present

## 2022-05-17 DIAGNOSIS — I872 Venous insufficiency (chronic) (peripheral): Secondary | ICD-10-CM | POA: Diagnosis not present

## 2022-05-17 DIAGNOSIS — R278 Other lack of coordination: Secondary | ICD-10-CM | POA: Diagnosis not present

## 2022-05-17 DIAGNOSIS — M6281 Muscle weakness (generalized): Secondary | ICD-10-CM | POA: Diagnosis not present

## 2022-05-17 DIAGNOSIS — R4189 Other symptoms and signs involving cognitive functions and awareness: Secondary | ICD-10-CM | POA: Diagnosis not present

## 2022-05-17 DIAGNOSIS — R2681 Unsteadiness on feet: Secondary | ICD-10-CM | POA: Diagnosis not present

## 2022-05-18 DIAGNOSIS — M6281 Muscle weakness (generalized): Secondary | ICD-10-CM | POA: Diagnosis not present

## 2022-05-18 DIAGNOSIS — I619 Nontraumatic intracerebral hemorrhage, unspecified: Secondary | ICD-10-CM | POA: Diagnosis not present

## 2022-05-18 DIAGNOSIS — R2681 Unsteadiness on feet: Secondary | ICD-10-CM | POA: Diagnosis not present

## 2022-05-18 DIAGNOSIS — I872 Venous insufficiency (chronic) (peripheral): Secondary | ICD-10-CM | POA: Diagnosis not present

## 2022-05-18 DIAGNOSIS — R2689 Other abnormalities of gait and mobility: Secondary | ICD-10-CM | POA: Diagnosis not present

## 2022-05-18 DIAGNOSIS — R278 Other lack of coordination: Secondary | ICD-10-CM | POA: Diagnosis not present

## 2022-05-18 DIAGNOSIS — Z741 Need for assistance with personal care: Secondary | ICD-10-CM | POA: Diagnosis not present

## 2022-05-18 DIAGNOSIS — R4189 Other symptoms and signs involving cognitive functions and awareness: Secondary | ICD-10-CM | POA: Diagnosis not present

## 2022-05-19 ENCOUNTER — Ambulatory Visit: Payer: Medicare PPO | Admitting: Adult Health

## 2022-05-21 ENCOUNTER — Encounter: Payer: Self-pay | Admitting: Student

## 2022-05-21 NOTE — Progress Notes (Unsigned)
Location:  Other [facility].  Nursing Home Room Number: [facility] 114P Place of Service:  ALF [phone]) Provider:  Dr. Earnestine Mealing  PCP: Jacky Kindle, FNP  Patient Care Team: Jacky Kindle, FNP as PCP - General (Family Medicine) Debbe Odea, MD as PCP - Cardiology (Cardiology) Dingeldein, Viviann Spare, MD as Consulting Physician (Ophthalmology) Dasher, Cliffton Asters, MD as Consulting Physician (Dermatology) Ihor Austin, NP as Nurse Practitioner (Neurology)  Extended Emergency Contact Information Primary Emergency Contact: Knipfer,Matt  [place] of [place] Home Phone: [phone] Mobile Phone: [phone] Relation: Son Secondary Emergency Contact: Laverle Hobby Mobile Phone: [phone] Relation: Daughter  Code Status:  DNR Goals of care: Advanced Directive information    05/21/2022    8:32 AM  Advanced Directives  Does Patient Have a Medical Advance Directive? Yes  Type of Advance Directive Out of facility DNR (pink MOST or yellow form)  Does patient want to make changes to medical advance directive? No - Patient declined     Chief Complaint  Patient presents with   Medical Management of Chronic Issues    Medical Management of Chronic Issues.     HPI:  Pt is a 88 y.o. female seen today for medical management of chronic diseases.     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   Avitaminosis D 08/12/2014   Bowel disease 02/16/2008   Bunion 08/12/2014   Cancer    Skin cancer- basal- upper arm , upper chest   CN (constipation) 08/12/2014   DD (diverticular disease) 02/16/2008   Elevated liver enzymes 08/12/2014   Fibroids, submucosal 08/12/2014   of her lower lip    Hypercholesteremia 08/12/2014   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   Post menopausal syndrome 08/12/2014   Stroke    Past Surgical History:  Procedure Laterality Date   BUNIONECTOMY     05/2004, 2007   CATARACT EXTRACTION     04/2005,  06/2005   COLONOSCOPY W/ POLYPECTOMY     COLONOSCOPY WITH PROPOFOL N/A 02/15/2017   Procedure: COLONOSCOPY WITH PROPOFOL;  Surgeon: Wyline Mood, MD;  Location: [facility] ENDOSCOPY;  Service: Gastroenterology;  Laterality: N/A;   HYSTEROSCOPY  2011   ORIF WRIST FRACTURE Right 08/30/2015   Procedure: OPEN REDUCTION INTERNAL FIXATION (ORIF) RIGHT WRIST FRACTURE AND REPAIR AS NECESSARY;  Surgeon: Dominica Severin, MD;  Location: [facility] OR;  Service: Orthopedics;  Laterality: Right;   UPPER GI ENDOSCOPY     WRIST FRACTURE SURGERY Left    06/2006    No Known Allergies  Outpatient Encounter Medications as of 05/21/2022  Medication Sig   bisacodyl (DULCOLAX) 10 MG suppository Place 10 mg rectally. Every 24 hours as needed.   Calcium Carbonate-Vit D-Min (CALCIUM 1200 PO) Take by mouth daily.    cholecalciferol (VITAMIN D3) 25 MCG (1000 UT) tablet Take 1,000 Units by mouth daily.   lamoTRIgine (LAMICTAL) 100 MG tablet Take 1 tablet (100 mg total) by mouth 2 (two) times daily.   losartan (COZAAR) 50 MG tablet Take 1 tablet (50 mg total) by mouth daily.   OXYGEN 2lpm for dyspnea/SOB every 8 hours as needed.   timolol (TIMOPTIC) 0.5 % ophthalmic solution Place 1 drop into both eyes at bedtime.   [DISCONTINUED] divalproex (DEPAKOTE ER) 500 MG 24 hr tablet Take 1 tablet (500 mg total) by mouth at bedtime.   [DISCONTINUED] lamoTRIgine (LAMICTAL) 25 MG tablet weeks Depakote   Lamotrigine 25mg  1st 500mg /500mg  1/1 2nd 0/500mg  2/2 3rd  0/0 3/3 4th 0/0 4/4   Lamotrigine 100mg  twice a day   [DISCONTINUED] nitrofurantoin, macrocrystal-monohydrate, (MACROBID) 100 MG capsule Take 1 capsule (100 mg total) by mouth 2 (two) times daily.   No facility-administered encounter medications on file as of 05/21/2022.    Review of Systems  Immunization History  Administered Date(s) Administered   Fluad Quad(high Dose 65+) 09/20/2018, 11/19/2021   Influenza, High Dose Seasonal PF 11/24/2016   Influenza-Unspecified 10/13/2017,  12/17/2019, 11/13/2020   Pneumococcal Conjugate-13 07/02/2014   Pneumococcal Polysaccharide-23 07/01/2004, 11/05/2004   Td 01/14/2003, 04/08/2010   Tdap 04/08/2010, 08/26/2015   Zoster Recombinat (Shingrix) 10/13/2017, 02/21/2018, 02/21/2018   Zoster, Live 03/11/2005   Pertinent  Health Maintenance Due  Topic Date Due   DEXA SCAN  02/05/2023 (Originally 11/21/2021)   COLONOSCOPY (Pts 45-73yrs Insurance coverage will need to be confirmed)  02/05/2023 (Originally 02/16/2020)   INFLUENZA VACCINE  09/02/2022      05/29/2021    1:17 PM 11/13/2021    4:49 PM 11/19/2021    1:16 PM 02/04/2022    2:22 PM 04/08/2022    2:00 PM  Fall Risk  Falls in the past year? 0  0 0 1  Was there an injury with Fall? 0  0 0 1  Fall Risk Category Calculator 0  0 0 3  Fall Risk Category (Retired) Low  Low Low   (RETIRED) Patient Fall Risk Level Low fall risk High fall risk Low fall risk Low fall risk   Patient at Risk for Falls Due to   No Fall Risks No Fall Risks   Fall risk Follow up   Falls evaluation completed     Functional Status Survey:    Vitals:   05/21/22 0824  BP: 127/72  Pulse: 87  Resp: (!) 26  Temp: 98 F (36.7 C)  SpO2: 94%  Weight: 110 lb 3.2 oz (50 kg)  Height: 5\' 7"  (1.702 m)   Body mass index is 17.26 kg/m. Physical Exam  Labs reviewed: Recent Labs    01/12/22 1622 04/08/22 1423 04/19/22 1527  NA 131* 122* 128*  K 5.4* 4.6 4.6  CL 93* 87* 89*  CO2 25 20 23   GLUCOSE 97 126* 90  BUN 24 18 16   CREATININE 0.94 0.74 0.95  CALCIUM 10.5* 9.2 9.5   Recent Labs    01/12/22 1622 04/19/22 1527  AST 16 17  ALT 11 10  ALKPHOS 36* 125*  BILITOT 0.5 0.5  PROT 6.7 6.1  ALBUMIN 4.7 4.2   Recent Labs    11/13/21 1651 01/12/22 1622  WBC 5.6 5.7  NEUTROABS 4.1 3.9  HGB 11.6* 12.5  HCT 35.3* 36.6  MCV 96.4 93  PLT 286 327   Lab Results  Component Value Date   TSH 3.790 01/12/2022   Lab Results  Component Value Date   HGBA1C 5.6 12/01/2020   Lab Results   Component Value Date   CHOL 192 09/24/2019   HDL 67 09/24/2019   LDLCALC 110 (H) 09/24/2019   TRIG 83 09/24/2019   CHOLHDL 2.8 09/20/2018    Significant Diagnostic Results in last 30 days:  CT Cervical Spine Wo Contrast  Result Date: 04/24/2022 CLINICAL DATA:  88 year old female status post fall in shower. EXAM: CT CERVICAL SPINE WITHOUT CONTRAST TECHNIQUE: Multidetector CT imaging of the cervical spine was performed without intravenous contrast. Multiplanar CT image reconstructions were also generated. RADIATION DOSE REDUCTION: This exam was performed according to the departmental dose-optimization program which includes automated exposure control, adjustment  of the mA and/or kV according to patient size and/or use of iterative reconstruction technique. COMPARISON:  Head CT today.  Prior CTA neck 01/14/2018. FINDINGS: Alignment: Straightening of cervical lordosis and subtle degenerative retrolisthesis of C4 on C5, C5 on C6 are stable since the 2019 CTA. Cervicothoracic junction alignment is within normal limits. Bilateral posterior element alignment is within normal limits. Skull base and vertebrae: Chronic osteopenia. Visualized skull base is intact. No atlanto-occipital dissociation. C1 and C2 appear intact and aligned. No acute osseous abnormality identified. Soft tissues and spinal canal: No prevertebral fluid or swelling. No visible canal hematoma. Capacious spinal canal. Negative visible noncontrast neck soft tissues. Disc levels: Chronic cervical spine degeneration, advanced at C4-C5 but mild for age otherwise, and underlying Capacious spinal canal. No CT evidence of spinal stenosis. Upper chest: Visible upper thoracic vertebrae appears stable with osteopenia. But a healed chronic posterior left 4th rib fracture appears new since 2019. Chronic Mild apical lung scarring. IMPRESSION: 1. No acute traumatic injury identified in the cervical spine. 2. Osteopenia. A chronic appearing posterior left  4th rib fracture is new since 2019. Electronically Signed   By: Odessa Fleming M.D.   On: 04/24/2022 12:23   CT HEAD WO CONTRAST ( )  Result Date: 04/24/2022 CLINICAL DATA:  88 year old female status post fall in shower. History of a 2019 anterior left frontal lobe intra-axial hemorrhage. EXAM: CT HEAD WITHOUT CONTRAST TECHNIQUE: Contiguous axial images were obtained from the base of the skull through the vertex without intravenous contrast. RADIATION DOSE REDUCTION: This exam was performed according to the departmental dose-optimization program which includes automated exposure control, adjustment of the mA and/or kV according to patient size and/or use of iterative reconstruction technique. COMPARISON:  Brain MRI 01/14/2020.  Head CT 11/13/2021. FINDINGS: Brain: Chronic encephalomalacia, with hemosiderin/superficial siderosis demonstrated on 2021 MRI) in the anterior left frontal lobe. Associated dystrophic calcification (series 2, image 21) these findings are chronic and stable. No superimposed midline shift, ventriculomegaly, mass effect, evidence of mass lesion, acute No suspicious intracranial vascular hyperdensity. intracranial hemorrhage or evidence of cortically based acute infarction. Stable gray-white matter differentiation throughout the brain. Vascular: Calcified atherosclerosis at the skull base. Skull: Osteopenia.  No acute osseous abnormality identified. Sinuses/Orbits: Visualized paranasal sinuses and mastoids are stable and well aerated. Other: No acute orbit or scalp soft tissue injury identified. IMPRESSION: 1. No acute intracranial abnormality or acute traumatic injury identified. 2. Chronic left frontal lobe encephalomalacia. Electronically Signed   By: Odessa Fleming M.D.   On: 04/24/2022 12:19    Assessment/Plan There are no diagnoses linked to this encounter.   Family/ staff Communication: ***  Labs/tests ordered:  ***

## 2022-05-23 ENCOUNTER — Encounter: Payer: Self-pay | Admitting: Student

## 2022-05-24 ENCOUNTER — Other Ambulatory Visit: Payer: Self-pay | Admitting: Family Medicine

## 2022-05-24 DIAGNOSIS — I872 Venous insufficiency (chronic) (peripheral): Secondary | ICD-10-CM | POA: Diagnosis not present

## 2022-05-24 DIAGNOSIS — Z741 Need for assistance with personal care: Secondary | ICD-10-CM | POA: Diagnosis not present

## 2022-05-24 DIAGNOSIS — R278 Other lack of coordination: Secondary | ICD-10-CM | POA: Diagnosis not present

## 2022-05-24 DIAGNOSIS — R2681 Unsteadiness on feet: Secondary | ICD-10-CM | POA: Diagnosis not present

## 2022-05-24 DIAGNOSIS — I619 Nontraumatic intracerebral hemorrhage, unspecified: Secondary | ICD-10-CM | POA: Diagnosis not present

## 2022-05-24 DIAGNOSIS — R4189 Other symptoms and signs involving cognitive functions and awareness: Secondary | ICD-10-CM | POA: Diagnosis not present

## 2022-05-24 DIAGNOSIS — R2689 Other abnormalities of gait and mobility: Secondary | ICD-10-CM | POA: Diagnosis not present

## 2022-05-24 DIAGNOSIS — M6281 Muscle weakness (generalized): Secondary | ICD-10-CM | POA: Diagnosis not present

## 2022-05-27 NOTE — Telephone Encounter (Signed)
error 

## 2022-05-28 DIAGNOSIS — Z741 Need for assistance with personal care: Secondary | ICD-10-CM | POA: Diagnosis not present

## 2022-05-28 DIAGNOSIS — R4189 Other symptoms and signs involving cognitive functions and awareness: Secondary | ICD-10-CM | POA: Diagnosis not present

## 2022-05-28 DIAGNOSIS — I619 Nontraumatic intracerebral hemorrhage, unspecified: Secondary | ICD-10-CM | POA: Diagnosis not present

## 2022-05-28 DIAGNOSIS — R2681 Unsteadiness on feet: Secondary | ICD-10-CM | POA: Diagnosis not present

## 2022-05-28 DIAGNOSIS — M6281 Muscle weakness (generalized): Secondary | ICD-10-CM | POA: Diagnosis not present

## 2022-05-28 DIAGNOSIS — I872 Venous insufficiency (chronic) (peripheral): Secondary | ICD-10-CM | POA: Diagnosis not present

## 2022-05-28 DIAGNOSIS — R278 Other lack of coordination: Secondary | ICD-10-CM | POA: Diagnosis not present

## 2022-05-28 DIAGNOSIS — R2689 Other abnormalities of gait and mobility: Secondary | ICD-10-CM | POA: Diagnosis not present

## 2022-05-31 DIAGNOSIS — I872 Venous insufficiency (chronic) (peripheral): Secondary | ICD-10-CM | POA: Diagnosis not present

## 2022-05-31 DIAGNOSIS — R4189 Other symptoms and signs involving cognitive functions and awareness: Secondary | ICD-10-CM | POA: Diagnosis not present

## 2022-05-31 DIAGNOSIS — R2681 Unsteadiness on feet: Secondary | ICD-10-CM | POA: Diagnosis not present

## 2022-05-31 DIAGNOSIS — M6281 Muscle weakness (generalized): Secondary | ICD-10-CM | POA: Diagnosis not present

## 2022-05-31 DIAGNOSIS — I619 Nontraumatic intracerebral hemorrhage, unspecified: Secondary | ICD-10-CM | POA: Diagnosis not present

## 2022-05-31 DIAGNOSIS — R278 Other lack of coordination: Secondary | ICD-10-CM | POA: Diagnosis not present

## 2022-05-31 DIAGNOSIS — Z741 Need for assistance with personal care: Secondary | ICD-10-CM | POA: Diagnosis not present

## 2022-05-31 DIAGNOSIS — R2689 Other abnormalities of gait and mobility: Secondary | ICD-10-CM | POA: Diagnosis not present

## 2022-06-02 ENCOUNTER — Other Ambulatory Visit: Payer: Self-pay | Admitting: Family Medicine

## 2022-06-02 MED ORDER — CLOBETASOL PROPIONATE 0.05 % EX OINT: 1.0000 | TOPICAL_OINTMENT | Freq: Every evening | CUTANEOUS | 0 refills | Status: AC

## 2022-06-02 MED ORDER — DIPHENHYDRAMINE HCL 25 MG PO CAPS: 25.0000 mg | ORAL_CAPSULE | Freq: Two times a day (BID) | ORAL | 0 refills | Status: AC | PRN

## 2022-06-04 ENCOUNTER — Other Ambulatory Visit: Payer: Self-pay | Admitting: Family Medicine

## 2022-06-04 MED ORDER — KETOCONAZOLE 2 % EX SHAM: 1.0000 | MEDICATED_SHAMPOO | CUTANEOUS | 0 refills | Status: AC

## 2022-06-07 DIAGNOSIS — I872 Venous insufficiency (chronic) (peripheral): Secondary | ICD-10-CM | POA: Diagnosis not present

## 2022-06-07 DIAGNOSIS — I619 Nontraumatic intracerebral hemorrhage, unspecified: Secondary | ICD-10-CM | POA: Diagnosis not present

## 2022-06-07 DIAGNOSIS — R278 Other lack of coordination: Secondary | ICD-10-CM | POA: Diagnosis not present

## 2022-06-07 DIAGNOSIS — M6281 Muscle weakness (generalized): Secondary | ICD-10-CM | POA: Diagnosis not present

## 2022-06-07 DIAGNOSIS — R2681 Unsteadiness on feet: Secondary | ICD-10-CM | POA: Diagnosis not present

## 2022-06-07 DIAGNOSIS — Z741 Need for assistance with personal care: Secondary | ICD-10-CM | POA: Diagnosis not present

## 2022-06-07 DIAGNOSIS — R2689 Other abnormalities of gait and mobility: Secondary | ICD-10-CM | POA: Diagnosis not present

## 2022-06-07 DIAGNOSIS — R4189 Other symptoms and signs involving cognitive functions and awareness: Secondary | ICD-10-CM | POA: Diagnosis not present

## 2022-06-09 ENCOUNTER — Other Ambulatory Visit: Payer: Self-pay | Admitting: Family Medicine

## 2022-06-10 ENCOUNTER — Telehealth: Payer: Self-pay | Admitting: Family Medicine

## 2022-06-10 DIAGNOSIS — R278 Other lack of coordination: Secondary | ICD-10-CM | POA: Diagnosis not present

## 2022-06-10 DIAGNOSIS — M6281 Muscle weakness (generalized): Secondary | ICD-10-CM | POA: Diagnosis not present

## 2022-06-10 DIAGNOSIS — R2681 Unsteadiness on feet: Secondary | ICD-10-CM | POA: Diagnosis not present

## 2022-06-10 DIAGNOSIS — R4189 Other symptoms and signs involving cognitive functions and awareness: Secondary | ICD-10-CM | POA: Diagnosis not present

## 2022-06-10 DIAGNOSIS — Z741 Need for assistance with personal care: Secondary | ICD-10-CM | POA: Diagnosis not present

## 2022-06-10 DIAGNOSIS — R2689 Other abnormalities of gait and mobility: Secondary | ICD-10-CM | POA: Diagnosis not present

## 2022-06-10 DIAGNOSIS — I619 Nontraumatic intracerebral hemorrhage, unspecified: Secondary | ICD-10-CM | POA: Diagnosis not present

## 2022-06-10 DIAGNOSIS — I872 Venous insufficiency (chronic) (peripheral): Secondary | ICD-10-CM | POA: Diagnosis not present

## 2022-06-10 NOTE — Telephone Encounter (Signed)
CoverMyMeds requesting prior authorization Key: [id number] Name: [place] Ketoconazole 2% Shampoo

## 2022-06-11 DIAGNOSIS — R278 Other lack of coordination: Secondary | ICD-10-CM | POA: Diagnosis not present

## 2022-06-11 DIAGNOSIS — I872 Venous insufficiency (chronic) (peripheral): Secondary | ICD-10-CM | POA: Diagnosis not present

## 2022-06-11 DIAGNOSIS — R4189 Other symptoms and signs involving cognitive functions and awareness: Secondary | ICD-10-CM | POA: Diagnosis not present

## 2022-06-11 DIAGNOSIS — M6281 Muscle weakness (generalized): Secondary | ICD-10-CM | POA: Diagnosis not present

## 2022-06-11 DIAGNOSIS — R2681 Unsteadiness on feet: Secondary | ICD-10-CM | POA: Diagnosis not present

## 2022-06-11 DIAGNOSIS — I619 Nontraumatic intracerebral hemorrhage, unspecified: Secondary | ICD-10-CM | POA: Diagnosis not present

## 2022-06-11 DIAGNOSIS — R2689 Other abnormalities of gait and mobility: Secondary | ICD-10-CM | POA: Diagnosis not present

## 2022-06-11 DIAGNOSIS — Z741 Need for assistance with personal care: Secondary | ICD-10-CM | POA: Diagnosis not present

## 2022-06-15 ENCOUNTER — Telehealth: Payer: Self-pay

## 2022-06-15 DIAGNOSIS — I619 Nontraumatic intracerebral hemorrhage, unspecified: Secondary | ICD-10-CM | POA: Diagnosis not present

## 2022-06-15 DIAGNOSIS — R4189 Other symptoms and signs involving cognitive functions and awareness: Secondary | ICD-10-CM | POA: Diagnosis not present

## 2022-06-15 DIAGNOSIS — I872 Venous insufficiency (chronic) (peripheral): Secondary | ICD-10-CM | POA: Diagnosis not present

## 2022-06-15 DIAGNOSIS — Z741 Need for assistance with personal care: Secondary | ICD-10-CM | POA: Diagnosis not present

## 2022-06-15 DIAGNOSIS — R2681 Unsteadiness on feet: Secondary | ICD-10-CM | POA: Diagnosis not present

## 2022-06-15 DIAGNOSIS — R278 Other lack of coordination: Secondary | ICD-10-CM | POA: Diagnosis not present

## 2022-06-15 DIAGNOSIS — M6281 Muscle weakness (generalized): Secondary | ICD-10-CM | POA: Diagnosis not present

## 2022-06-15 DIAGNOSIS — R2689 Other abnormalities of gait and mobility: Secondary | ICD-10-CM | POA: Diagnosis not present

## 2022-06-15 NOTE — Telephone Encounter (Signed)
PA started for Ketoconazole shampoo Key [id number]

## 2022-06-16 DIAGNOSIS — I619 Nontraumatic intracerebral hemorrhage, unspecified: Secondary | ICD-10-CM | POA: Diagnosis not present

## 2022-06-16 DIAGNOSIS — M6281 Muscle weakness (generalized): Secondary | ICD-10-CM | POA: Diagnosis not present

## 2022-06-16 DIAGNOSIS — R278 Other lack of coordination: Secondary | ICD-10-CM | POA: Diagnosis not present

## 2022-06-16 DIAGNOSIS — I872 Venous insufficiency (chronic) (peripheral): Secondary | ICD-10-CM | POA: Diagnosis not present

## 2022-06-16 DIAGNOSIS — R4189 Other symptoms and signs involving cognitive functions and awareness: Secondary | ICD-10-CM | POA: Diagnosis not present

## 2022-06-16 DIAGNOSIS — R2689 Other abnormalities of gait and mobility: Secondary | ICD-10-CM | POA: Diagnosis not present

## 2022-06-16 DIAGNOSIS — R2681 Unsteadiness on feet: Secondary | ICD-10-CM | POA: Diagnosis not present

## 2022-06-16 DIAGNOSIS — Z741 Need for assistance with personal care: Secondary | ICD-10-CM | POA: Diagnosis not present

## 2022-06-17 DIAGNOSIS — I872 Venous insufficiency (chronic) (peripheral): Secondary | ICD-10-CM | POA: Diagnosis not present

## 2022-06-17 DIAGNOSIS — Z741 Need for assistance with personal care: Secondary | ICD-10-CM | POA: Diagnosis not present

## 2022-06-17 DIAGNOSIS — R278 Other lack of coordination: Secondary | ICD-10-CM | POA: Diagnosis not present

## 2022-06-17 DIAGNOSIS — I619 Nontraumatic intracerebral hemorrhage, unspecified: Secondary | ICD-10-CM | POA: Diagnosis not present

## 2022-06-17 DIAGNOSIS — R4189 Other symptoms and signs involving cognitive functions and awareness: Secondary | ICD-10-CM | POA: Diagnosis not present

## 2022-06-17 DIAGNOSIS — R2689 Other abnormalities of gait and mobility: Secondary | ICD-10-CM | POA: Diagnosis not present

## 2022-06-17 DIAGNOSIS — R2681 Unsteadiness on feet: Secondary | ICD-10-CM | POA: Diagnosis not present

## 2022-06-17 DIAGNOSIS — M6281 Muscle weakness (generalized): Secondary | ICD-10-CM | POA: Diagnosis not present

## 2022-06-17 NOTE — Telephone Encounter (Signed)
PA Started:

## 2022-06-17 NOTE — Telephone Encounter (Signed)
Outcome Additional Information Required Available without authorization. Drug Ketoconazole 2% shampoo

## 2022-06-22 DIAGNOSIS — Z741 Need for assistance with personal care: Secondary | ICD-10-CM | POA: Diagnosis not present

## 2022-06-22 DIAGNOSIS — I619 Nontraumatic intracerebral hemorrhage, unspecified: Secondary | ICD-10-CM | POA: Diagnosis not present

## 2022-06-22 DIAGNOSIS — R278 Other lack of coordination: Secondary | ICD-10-CM | POA: Diagnosis not present

## 2022-06-22 DIAGNOSIS — R2689 Other abnormalities of gait and mobility: Secondary | ICD-10-CM | POA: Diagnosis not present

## 2022-06-22 DIAGNOSIS — R2681 Unsteadiness on feet: Secondary | ICD-10-CM | POA: Diagnosis not present

## 2022-06-22 DIAGNOSIS — M6281 Muscle weakness (generalized): Secondary | ICD-10-CM | POA: Diagnosis not present

## 2022-06-22 DIAGNOSIS — R4189 Other symptoms and signs involving cognitive functions and awareness: Secondary | ICD-10-CM | POA: Diagnosis not present

## 2022-06-22 DIAGNOSIS — I872 Venous insufficiency (chronic) (peripheral): Secondary | ICD-10-CM | POA: Diagnosis not present

## 2022-06-24 DIAGNOSIS — R2689 Other abnormalities of gait and mobility: Secondary | ICD-10-CM | POA: Diagnosis not present

## 2022-06-24 DIAGNOSIS — R278 Other lack of coordination: Secondary | ICD-10-CM | POA: Diagnosis not present

## 2022-06-24 DIAGNOSIS — Z741 Need for assistance with personal care: Secondary | ICD-10-CM | POA: Diagnosis not present

## 2022-06-24 DIAGNOSIS — M6281 Muscle weakness (generalized): Secondary | ICD-10-CM | POA: Diagnosis not present

## 2022-06-24 DIAGNOSIS — I872 Venous insufficiency (chronic) (peripheral): Secondary | ICD-10-CM | POA: Diagnosis not present

## 2022-06-24 DIAGNOSIS — I619 Nontraumatic intracerebral hemorrhage, unspecified: Secondary | ICD-10-CM | POA: Diagnosis not present

## 2022-06-24 DIAGNOSIS — R4189 Other symptoms and signs involving cognitive functions and awareness: Secondary | ICD-10-CM | POA: Diagnosis not present

## 2022-06-24 DIAGNOSIS — R2681 Unsteadiness on feet: Secondary | ICD-10-CM | POA: Diagnosis not present

## 2022-06-25 DIAGNOSIS — R2689 Other abnormalities of gait and mobility: Secondary | ICD-10-CM | POA: Diagnosis not present

## 2022-06-25 DIAGNOSIS — R4189 Other symptoms and signs involving cognitive functions and awareness: Secondary | ICD-10-CM | POA: Diagnosis not present

## 2022-06-25 DIAGNOSIS — Z741 Need for assistance with personal care: Secondary | ICD-10-CM | POA: Diagnosis not present

## 2022-06-25 DIAGNOSIS — I872 Venous insufficiency (chronic) (peripheral): Secondary | ICD-10-CM | POA: Diagnosis not present

## 2022-06-25 DIAGNOSIS — R278 Other lack of coordination: Secondary | ICD-10-CM | POA: Diagnosis not present

## 2022-06-25 DIAGNOSIS — I619 Nontraumatic intracerebral hemorrhage, unspecified: Secondary | ICD-10-CM | POA: Diagnosis not present

## 2022-06-25 DIAGNOSIS — R2681 Unsteadiness on feet: Secondary | ICD-10-CM | POA: Diagnosis not present

## 2022-06-25 DIAGNOSIS — M6281 Muscle weakness (generalized): Secondary | ICD-10-CM | POA: Diagnosis not present

## 2022-07-01 DIAGNOSIS — M6281 Muscle weakness (generalized): Secondary | ICD-10-CM | POA: Diagnosis not present

## 2022-07-01 DIAGNOSIS — Z741 Need for assistance with personal care: Secondary | ICD-10-CM | POA: Diagnosis not present

## 2022-07-01 DIAGNOSIS — I872 Venous insufficiency (chronic) (peripheral): Secondary | ICD-10-CM | POA: Diagnosis not present

## 2022-07-01 DIAGNOSIS — R4189 Other symptoms and signs involving cognitive functions and awareness: Secondary | ICD-10-CM | POA: Diagnosis not present

## 2022-07-01 DIAGNOSIS — R2681 Unsteadiness on feet: Secondary | ICD-10-CM | POA: Diagnosis not present

## 2022-07-01 DIAGNOSIS — I619 Nontraumatic intracerebral hemorrhage, unspecified: Secondary | ICD-10-CM | POA: Diagnosis not present

## 2022-07-01 DIAGNOSIS — R2689 Other abnormalities of gait and mobility: Secondary | ICD-10-CM | POA: Diagnosis not present

## 2022-07-01 DIAGNOSIS — R278 Other lack of coordination: Secondary | ICD-10-CM | POA: Diagnosis not present

## 2022-07-02 DIAGNOSIS — M6281 Muscle weakness (generalized): Secondary | ICD-10-CM | POA: Diagnosis not present

## 2022-07-02 DIAGNOSIS — I619 Nontraumatic intracerebral hemorrhage, unspecified: Secondary | ICD-10-CM | POA: Diagnosis not present

## 2022-07-02 DIAGNOSIS — R2681 Unsteadiness on feet: Secondary | ICD-10-CM | POA: Diagnosis not present

## 2022-07-02 DIAGNOSIS — R4189 Other symptoms and signs involving cognitive functions and awareness: Secondary | ICD-10-CM | POA: Diagnosis not present

## 2022-07-02 DIAGNOSIS — R278 Other lack of coordination: Secondary | ICD-10-CM | POA: Diagnosis not present

## 2022-07-02 DIAGNOSIS — I872 Venous insufficiency (chronic) (peripheral): Secondary | ICD-10-CM | POA: Diagnosis not present

## 2022-07-02 DIAGNOSIS — R2689 Other abnormalities of gait and mobility: Secondary | ICD-10-CM | POA: Diagnosis not present

## 2022-07-02 DIAGNOSIS — Z741 Need for assistance with personal care: Secondary | ICD-10-CM | POA: Diagnosis not present

## 2022-07-05 DIAGNOSIS — Z741 Need for assistance with personal care: Secondary | ICD-10-CM | POA: Diagnosis not present

## 2022-07-05 DIAGNOSIS — R2681 Unsteadiness on feet: Secondary | ICD-10-CM | POA: Diagnosis not present

## 2022-07-05 DIAGNOSIS — R2689 Other abnormalities of gait and mobility: Secondary | ICD-10-CM | POA: Diagnosis not present

## 2022-07-05 DIAGNOSIS — M6281 Muscle weakness (generalized): Secondary | ICD-10-CM | POA: Diagnosis not present

## 2022-07-05 DIAGNOSIS — I619 Nontraumatic intracerebral hemorrhage, unspecified: Secondary | ICD-10-CM | POA: Diagnosis not present

## 2022-07-05 DIAGNOSIS — I872 Venous insufficiency (chronic) (peripheral): Secondary | ICD-10-CM | POA: Diagnosis not present

## 2022-07-05 DIAGNOSIS — R278 Other lack of coordination: Secondary | ICD-10-CM | POA: Diagnosis not present

## 2022-07-05 DIAGNOSIS — R4189 Other symptoms and signs involving cognitive functions and awareness: Secondary | ICD-10-CM | POA: Diagnosis not present

## 2022-07-09 DIAGNOSIS — R2689 Other abnormalities of gait and mobility: Secondary | ICD-10-CM | POA: Diagnosis not present

## 2022-07-09 DIAGNOSIS — R2681 Unsteadiness on feet: Secondary | ICD-10-CM | POA: Diagnosis not present

## 2022-07-09 DIAGNOSIS — R4189 Other symptoms and signs involving cognitive functions and awareness: Secondary | ICD-10-CM | POA: Diagnosis not present

## 2022-07-09 DIAGNOSIS — I619 Nontraumatic intracerebral hemorrhage, unspecified: Secondary | ICD-10-CM | POA: Diagnosis not present

## 2022-07-09 DIAGNOSIS — Z741 Need for assistance with personal care: Secondary | ICD-10-CM | POA: Diagnosis not present

## 2022-07-09 DIAGNOSIS — M6281 Muscle weakness (generalized): Secondary | ICD-10-CM | POA: Diagnosis not present

## 2022-07-09 DIAGNOSIS — R278 Other lack of coordination: Secondary | ICD-10-CM | POA: Diagnosis not present

## 2022-07-09 DIAGNOSIS — I872 Venous insufficiency (chronic) (peripheral): Secondary | ICD-10-CM | POA: Diagnosis not present

## 2022-07-12 DIAGNOSIS — M6281 Muscle weakness (generalized): Secondary | ICD-10-CM | POA: Diagnosis not present

## 2022-07-12 DIAGNOSIS — R278 Other lack of coordination: Secondary | ICD-10-CM | POA: Diagnosis not present

## 2022-07-12 DIAGNOSIS — I872 Venous insufficiency (chronic) (peripheral): Secondary | ICD-10-CM | POA: Diagnosis not present

## 2022-07-12 DIAGNOSIS — I619 Nontraumatic intracerebral hemorrhage, unspecified: Secondary | ICD-10-CM | POA: Diagnosis not present

## 2022-07-12 DIAGNOSIS — R2681 Unsteadiness on feet: Secondary | ICD-10-CM | POA: Diagnosis not present

## 2022-07-12 DIAGNOSIS — R4189 Other symptoms and signs involving cognitive functions and awareness: Secondary | ICD-10-CM | POA: Diagnosis not present

## 2022-07-12 DIAGNOSIS — Z741 Need for assistance with personal care: Secondary | ICD-10-CM | POA: Diagnosis not present

## 2022-07-12 DIAGNOSIS — R2689 Other abnormalities of gait and mobility: Secondary | ICD-10-CM | POA: Diagnosis not present

## 2022-07-15 ENCOUNTER — Ambulatory Visit: Payer: Medicare PPO | Admitting: Neurology

## 2022-07-15 DIAGNOSIS — R2689 Other abnormalities of gait and mobility: Secondary | ICD-10-CM | POA: Diagnosis not present

## 2022-07-15 DIAGNOSIS — R2681 Unsteadiness on feet: Secondary | ICD-10-CM | POA: Diagnosis not present

## 2022-07-15 DIAGNOSIS — I619 Nontraumatic intracerebral hemorrhage, unspecified: Secondary | ICD-10-CM | POA: Diagnosis not present

## 2022-07-15 DIAGNOSIS — R4189 Other symptoms and signs involving cognitive functions and awareness: Secondary | ICD-10-CM | POA: Diagnosis not present

## 2022-07-15 DIAGNOSIS — R278 Other lack of coordination: Secondary | ICD-10-CM | POA: Diagnosis not present

## 2022-07-15 DIAGNOSIS — I872 Venous insufficiency (chronic) (peripheral): Secondary | ICD-10-CM | POA: Diagnosis not present

## 2022-07-15 DIAGNOSIS — Z741 Need for assistance with personal care: Secondary | ICD-10-CM | POA: Diagnosis not present

## 2022-07-15 DIAGNOSIS — M6281 Muscle weakness (generalized): Secondary | ICD-10-CM | POA: Diagnosis not present

## 2022-07-19 DIAGNOSIS — R2689 Other abnormalities of gait and mobility: Secondary | ICD-10-CM | POA: Diagnosis not present

## 2022-07-19 DIAGNOSIS — R4189 Other symptoms and signs involving cognitive functions and awareness: Secondary | ICD-10-CM | POA: Diagnosis not present

## 2022-07-19 DIAGNOSIS — I619 Nontraumatic intracerebral hemorrhage, unspecified: Secondary | ICD-10-CM | POA: Diagnosis not present

## 2022-07-19 DIAGNOSIS — M6281 Muscle weakness (generalized): Secondary | ICD-10-CM | POA: Diagnosis not present

## 2022-07-19 DIAGNOSIS — Z741 Need for assistance with personal care: Secondary | ICD-10-CM | POA: Diagnosis not present

## 2022-07-19 DIAGNOSIS — R278 Other lack of coordination: Secondary | ICD-10-CM | POA: Diagnosis not present

## 2022-07-19 DIAGNOSIS — R2681 Unsteadiness on feet: Secondary | ICD-10-CM | POA: Diagnosis not present

## 2022-07-19 DIAGNOSIS — I872 Venous insufficiency (chronic) (peripheral): Secondary | ICD-10-CM | POA: Diagnosis not present

## 2022-07-26 DIAGNOSIS — R278 Other lack of coordination: Secondary | ICD-10-CM | POA: Diagnosis not present

## 2022-07-26 DIAGNOSIS — R4189 Other symptoms and signs involving cognitive functions and awareness: Secondary | ICD-10-CM | POA: Diagnosis not present

## 2022-07-26 DIAGNOSIS — I872 Venous insufficiency (chronic) (peripheral): Secondary | ICD-10-CM | POA: Diagnosis not present

## 2022-07-26 DIAGNOSIS — R2681 Unsteadiness on feet: Secondary | ICD-10-CM | POA: Diagnosis not present

## 2022-07-26 DIAGNOSIS — Z741 Need for assistance with personal care: Secondary | ICD-10-CM | POA: Diagnosis not present

## 2022-07-26 DIAGNOSIS — R2689 Other abnormalities of gait and mobility: Secondary | ICD-10-CM | POA: Diagnosis not present

## 2022-07-26 DIAGNOSIS — M6281 Muscle weakness (generalized): Secondary | ICD-10-CM | POA: Diagnosis not present

## 2022-07-26 DIAGNOSIS — I619 Nontraumatic intracerebral hemorrhage, unspecified: Secondary | ICD-10-CM | POA: Diagnosis not present

## 2022-07-27 DIAGNOSIS — I619 Nontraumatic intracerebral hemorrhage, unspecified: Secondary | ICD-10-CM | POA: Diagnosis not present

## 2022-07-27 DIAGNOSIS — I872 Venous insufficiency (chronic) (peripheral): Secondary | ICD-10-CM | POA: Diagnosis not present

## 2022-07-27 DIAGNOSIS — R2681 Unsteadiness on feet: Secondary | ICD-10-CM | POA: Diagnosis not present

## 2022-07-27 DIAGNOSIS — M6281 Muscle weakness (generalized): Secondary | ICD-10-CM | POA: Diagnosis not present

## 2022-07-27 DIAGNOSIS — R2689 Other abnormalities of gait and mobility: Secondary | ICD-10-CM | POA: Diagnosis not present

## 2022-07-27 DIAGNOSIS — Z741 Need for assistance with personal care: Secondary | ICD-10-CM | POA: Diagnosis not present

## 2022-07-27 DIAGNOSIS — R4189 Other symptoms and signs involving cognitive functions and awareness: Secondary | ICD-10-CM | POA: Diagnosis not present

## 2022-07-27 DIAGNOSIS — R278 Other lack of coordination: Secondary | ICD-10-CM | POA: Diagnosis not present

## 2022-07-28 DIAGNOSIS — Z741 Need for assistance with personal care: Secondary | ICD-10-CM | POA: Diagnosis not present

## 2022-07-28 DIAGNOSIS — R2689 Other abnormalities of gait and mobility: Secondary | ICD-10-CM | POA: Diagnosis not present

## 2022-07-28 DIAGNOSIS — I619 Nontraumatic intracerebral hemorrhage, unspecified: Secondary | ICD-10-CM | POA: Diagnosis not present

## 2022-07-28 DIAGNOSIS — R2681 Unsteadiness on feet: Secondary | ICD-10-CM | POA: Diagnosis not present

## 2022-07-28 DIAGNOSIS — R278 Other lack of coordination: Secondary | ICD-10-CM | POA: Diagnosis not present

## 2022-07-28 DIAGNOSIS — R4189 Other symptoms and signs involving cognitive functions and awareness: Secondary | ICD-10-CM | POA: Diagnosis not present

## 2022-07-28 DIAGNOSIS — M6281 Muscle weakness (generalized): Secondary | ICD-10-CM | POA: Diagnosis not present

## 2022-07-28 DIAGNOSIS — I872 Venous insufficiency (chronic) (peripheral): Secondary | ICD-10-CM | POA: Diagnosis not present

## 2022-08-02 DIAGNOSIS — R278 Other lack of coordination: Secondary | ICD-10-CM | POA: Diagnosis not present

## 2022-08-02 DIAGNOSIS — I619 Nontraumatic intracerebral hemorrhage, unspecified: Secondary | ICD-10-CM | POA: Diagnosis not present

## 2022-08-02 DIAGNOSIS — R2689 Other abnormalities of gait and mobility: Secondary | ICD-10-CM | POA: Diagnosis not present

## 2022-08-02 DIAGNOSIS — R2681 Unsteadiness on feet: Secondary | ICD-10-CM | POA: Diagnosis not present

## 2022-08-02 DIAGNOSIS — R4189 Other symptoms and signs involving cognitive functions and awareness: Secondary | ICD-10-CM | POA: Diagnosis not present

## 2022-08-02 DIAGNOSIS — Z741 Need for assistance with personal care: Secondary | ICD-10-CM | POA: Diagnosis not present

## 2022-08-02 DIAGNOSIS — M6281 Muscle weakness (generalized): Secondary | ICD-10-CM | POA: Diagnosis not present

## 2022-08-02 DIAGNOSIS — I872 Venous insufficiency (chronic) (peripheral): Secondary | ICD-10-CM | POA: Diagnosis not present

## 2022-08-04 DIAGNOSIS — R2689 Other abnormalities of gait and mobility: Secondary | ICD-10-CM | POA: Diagnosis not present

## 2022-08-04 DIAGNOSIS — Z741 Need for assistance with personal care: Secondary | ICD-10-CM | POA: Diagnosis not present

## 2022-08-04 DIAGNOSIS — R4189 Other symptoms and signs involving cognitive functions and awareness: Secondary | ICD-10-CM | POA: Diagnosis not present

## 2022-08-04 DIAGNOSIS — R278 Other lack of coordination: Secondary | ICD-10-CM | POA: Diagnosis not present

## 2022-08-04 DIAGNOSIS — I619 Nontraumatic intracerebral hemorrhage, unspecified: Secondary | ICD-10-CM | POA: Diagnosis not present

## 2022-08-04 DIAGNOSIS — M6281 Muscle weakness (generalized): Secondary | ICD-10-CM | POA: Diagnosis not present

## 2022-08-04 DIAGNOSIS — R2681 Unsteadiness on feet: Secondary | ICD-10-CM | POA: Diagnosis not present

## 2022-08-04 DIAGNOSIS — I872 Venous insufficiency (chronic) (peripheral): Secondary | ICD-10-CM | POA: Diagnosis not present

## 2022-08-09 DIAGNOSIS — R278 Other lack of coordination: Secondary | ICD-10-CM | POA: Diagnosis not present

## 2022-08-09 DIAGNOSIS — I619 Nontraumatic intracerebral hemorrhage, unspecified: Secondary | ICD-10-CM | POA: Diagnosis not present

## 2022-08-09 DIAGNOSIS — M6281 Muscle weakness (generalized): Secondary | ICD-10-CM | POA: Diagnosis not present

## 2022-08-09 DIAGNOSIS — R2681 Unsteadiness on feet: Secondary | ICD-10-CM | POA: Diagnosis not present

## 2022-08-09 DIAGNOSIS — R2689 Other abnormalities of gait and mobility: Secondary | ICD-10-CM | POA: Diagnosis not present

## 2022-08-09 DIAGNOSIS — Z741 Need for assistance with personal care: Secondary | ICD-10-CM | POA: Diagnosis not present

## 2022-08-09 DIAGNOSIS — I872 Venous insufficiency (chronic) (peripheral): Secondary | ICD-10-CM | POA: Diagnosis not present

## 2022-08-09 DIAGNOSIS — R4189 Other symptoms and signs involving cognitive functions and awareness: Secondary | ICD-10-CM | POA: Diagnosis not present

## 2022-08-11 DIAGNOSIS — M6281 Muscle weakness (generalized): Secondary | ICD-10-CM | POA: Diagnosis not present

## 2022-08-11 DIAGNOSIS — R4189 Other symptoms and signs involving cognitive functions and awareness: Secondary | ICD-10-CM | POA: Diagnosis not present

## 2022-08-11 DIAGNOSIS — R2681 Unsteadiness on feet: Secondary | ICD-10-CM | POA: Diagnosis not present

## 2022-08-11 DIAGNOSIS — R2689 Other abnormalities of gait and mobility: Secondary | ICD-10-CM | POA: Diagnosis not present

## 2022-08-11 DIAGNOSIS — I619 Nontraumatic intracerebral hemorrhage, unspecified: Secondary | ICD-10-CM | POA: Diagnosis not present

## 2022-08-11 DIAGNOSIS — Z741 Need for assistance with personal care: Secondary | ICD-10-CM | POA: Diagnosis not present

## 2022-08-11 DIAGNOSIS — I872 Venous insufficiency (chronic) (peripheral): Secondary | ICD-10-CM | POA: Diagnosis not present

## 2022-08-11 DIAGNOSIS — R278 Other lack of coordination: Secondary | ICD-10-CM | POA: Diagnosis not present

## 2022-08-13 DIAGNOSIS — R4189 Other symptoms and signs involving cognitive functions and awareness: Secondary | ICD-10-CM | POA: Diagnosis not present

## 2022-08-13 DIAGNOSIS — R2681 Unsteadiness on feet: Secondary | ICD-10-CM | POA: Diagnosis not present

## 2022-08-13 DIAGNOSIS — R278 Other lack of coordination: Secondary | ICD-10-CM | POA: Diagnosis not present

## 2022-08-13 DIAGNOSIS — I872 Venous insufficiency (chronic) (peripheral): Secondary | ICD-10-CM | POA: Diagnosis not present

## 2022-08-13 DIAGNOSIS — M6281 Muscle weakness (generalized): Secondary | ICD-10-CM | POA: Diagnosis not present

## 2022-08-13 DIAGNOSIS — I619 Nontraumatic intracerebral hemorrhage, unspecified: Secondary | ICD-10-CM | POA: Diagnosis not present

## 2022-08-13 DIAGNOSIS — R2689 Other abnormalities of gait and mobility: Secondary | ICD-10-CM | POA: Diagnosis not present

## 2022-08-13 DIAGNOSIS — Z741 Need for assistance with personal care: Secondary | ICD-10-CM | POA: Diagnosis not present

## 2022-08-17 DIAGNOSIS — I872 Venous insufficiency (chronic) (peripheral): Secondary | ICD-10-CM | POA: Diagnosis not present

## 2022-08-17 DIAGNOSIS — Z741 Need for assistance with personal care: Secondary | ICD-10-CM | POA: Diagnosis not present

## 2022-08-17 DIAGNOSIS — R2681 Unsteadiness on feet: Secondary | ICD-10-CM | POA: Diagnosis not present

## 2022-08-17 DIAGNOSIS — R2689 Other abnormalities of gait and mobility: Secondary | ICD-10-CM | POA: Diagnosis not present

## 2022-08-17 DIAGNOSIS — M6281 Muscle weakness (generalized): Secondary | ICD-10-CM | POA: Diagnosis not present

## 2022-08-17 DIAGNOSIS — R278 Other lack of coordination: Secondary | ICD-10-CM | POA: Diagnosis not present

## 2022-08-17 DIAGNOSIS — R4189 Other symptoms and signs involving cognitive functions and awareness: Secondary | ICD-10-CM | POA: Diagnosis not present

## 2022-08-17 DIAGNOSIS — I619 Nontraumatic intracerebral hemorrhage, unspecified: Secondary | ICD-10-CM | POA: Diagnosis not present

## 2022-08-23 DIAGNOSIS — R4189 Other symptoms and signs involving cognitive functions and awareness: Secondary | ICD-10-CM | POA: Diagnosis not present

## 2022-08-23 DIAGNOSIS — R2681 Unsteadiness on feet: Secondary | ICD-10-CM | POA: Diagnosis not present

## 2022-08-23 DIAGNOSIS — R2689 Other abnormalities of gait and mobility: Secondary | ICD-10-CM | POA: Diagnosis not present

## 2022-08-23 DIAGNOSIS — I619 Nontraumatic intracerebral hemorrhage, unspecified: Secondary | ICD-10-CM | POA: Diagnosis not present

## 2022-08-23 DIAGNOSIS — R278 Other lack of coordination: Secondary | ICD-10-CM | POA: Diagnosis not present

## 2022-08-23 DIAGNOSIS — Z741 Need for assistance with personal care: Secondary | ICD-10-CM | POA: Diagnosis not present

## 2022-08-23 DIAGNOSIS — M6281 Muscle weakness (generalized): Secondary | ICD-10-CM | POA: Diagnosis not present

## 2022-08-23 DIAGNOSIS — I872 Venous insufficiency (chronic) (peripheral): Secondary | ICD-10-CM | POA: Diagnosis not present

## 2022-08-26 DIAGNOSIS — R278 Other lack of coordination: Secondary | ICD-10-CM | POA: Diagnosis not present

## 2022-08-26 DIAGNOSIS — Z741 Need for assistance with personal care: Secondary | ICD-10-CM | POA: Diagnosis not present

## 2022-08-26 DIAGNOSIS — R2689 Other abnormalities of gait and mobility: Secondary | ICD-10-CM | POA: Diagnosis not present

## 2022-08-26 DIAGNOSIS — M6281 Muscle weakness (generalized): Secondary | ICD-10-CM | POA: Diagnosis not present

## 2022-08-26 DIAGNOSIS — R4189 Other symptoms and signs involving cognitive functions and awareness: Secondary | ICD-10-CM | POA: Diagnosis not present

## 2022-08-26 DIAGNOSIS — I619 Nontraumatic intracerebral hemorrhage, unspecified: Secondary | ICD-10-CM | POA: Diagnosis not present

## 2022-08-26 DIAGNOSIS — R2681 Unsteadiness on feet: Secondary | ICD-10-CM | POA: Diagnosis not present

## 2022-08-26 DIAGNOSIS — I872 Venous insufficiency (chronic) (peripheral): Secondary | ICD-10-CM | POA: Diagnosis not present

## 2022-08-30 DIAGNOSIS — R278 Other lack of coordination: Secondary | ICD-10-CM | POA: Diagnosis not present

## 2022-08-30 DIAGNOSIS — R2681 Unsteadiness on feet: Secondary | ICD-10-CM | POA: Diagnosis not present

## 2022-08-30 DIAGNOSIS — R2689 Other abnormalities of gait and mobility: Secondary | ICD-10-CM | POA: Diagnosis not present

## 2022-08-30 DIAGNOSIS — R4189 Other symptoms and signs involving cognitive functions and awareness: Secondary | ICD-10-CM | POA: Diagnosis not present

## 2022-08-30 DIAGNOSIS — I872 Venous insufficiency (chronic) (peripheral): Secondary | ICD-10-CM | POA: Diagnosis not present

## 2022-08-30 DIAGNOSIS — M6281 Muscle weakness (generalized): Secondary | ICD-10-CM | POA: Diagnosis not present

## 2022-08-30 DIAGNOSIS — Z741 Need for assistance with personal care: Secondary | ICD-10-CM | POA: Diagnosis not present

## 2022-08-30 DIAGNOSIS — I619 Nontraumatic intracerebral hemorrhage, unspecified: Secondary | ICD-10-CM | POA: Diagnosis not present

## 2022-09-02 DIAGNOSIS — R278 Other lack of coordination: Secondary | ICD-10-CM | POA: Diagnosis not present

## 2022-09-02 DIAGNOSIS — R2689 Other abnormalities of gait and mobility: Secondary | ICD-10-CM | POA: Diagnosis not present

## 2022-09-02 DIAGNOSIS — R2681 Unsteadiness on feet: Secondary | ICD-10-CM | POA: Diagnosis not present

## 2022-09-02 DIAGNOSIS — I619 Nontraumatic intracerebral hemorrhage, unspecified: Secondary | ICD-10-CM | POA: Diagnosis not present

## 2022-09-02 DIAGNOSIS — Z741 Need for assistance with personal care: Secondary | ICD-10-CM | POA: Diagnosis not present

## 2022-09-02 DIAGNOSIS — I872 Venous insufficiency (chronic) (peripheral): Secondary | ICD-10-CM | POA: Diagnosis not present

## 2022-09-02 DIAGNOSIS — R4189 Other symptoms and signs involving cognitive functions and awareness: Secondary | ICD-10-CM | POA: Diagnosis not present

## 2022-09-02 DIAGNOSIS — M6281 Muscle weakness (generalized): Secondary | ICD-10-CM | POA: Diagnosis not present

## 2022-09-07 DIAGNOSIS — I619 Nontraumatic intracerebral hemorrhage, unspecified: Secondary | ICD-10-CM | POA: Diagnosis not present

## 2022-09-07 DIAGNOSIS — Z741 Need for assistance with personal care: Secondary | ICD-10-CM | POA: Diagnosis not present

## 2022-09-07 DIAGNOSIS — R2681 Unsteadiness on feet: Secondary | ICD-10-CM | POA: Diagnosis not present

## 2022-09-07 DIAGNOSIS — M6281 Muscle weakness (generalized): Secondary | ICD-10-CM | POA: Diagnosis not present

## 2022-09-07 DIAGNOSIS — R4189 Other symptoms and signs involving cognitive functions and awareness: Secondary | ICD-10-CM | POA: Diagnosis not present

## 2022-09-07 DIAGNOSIS — I872 Venous insufficiency (chronic) (peripheral): Secondary | ICD-10-CM | POA: Diagnosis not present

## 2022-09-07 DIAGNOSIS — R278 Other lack of coordination: Secondary | ICD-10-CM | POA: Diagnosis not present

## 2022-09-07 DIAGNOSIS — R2689 Other abnormalities of gait and mobility: Secondary | ICD-10-CM | POA: Diagnosis not present

## 2022-09-28 DIAGNOSIS — M6281 Muscle weakness (generalized): Secondary | ICD-10-CM | POA: Diagnosis not present

## 2022-09-28 DIAGNOSIS — R4189 Other symptoms and signs involving cognitive functions and awareness: Secondary | ICD-10-CM | POA: Diagnosis not present

## 2022-09-28 DIAGNOSIS — R2681 Unsteadiness on feet: Secondary | ICD-10-CM | POA: Diagnosis not present

## 2022-09-28 DIAGNOSIS — R278 Other lack of coordination: Secondary | ICD-10-CM | POA: Diagnosis not present

## 2022-09-28 DIAGNOSIS — I872 Venous insufficiency (chronic) (peripheral): Secondary | ICD-10-CM | POA: Diagnosis not present

## 2022-09-28 DIAGNOSIS — I619 Nontraumatic intracerebral hemorrhage, unspecified: Secondary | ICD-10-CM | POA: Diagnosis not present

## 2022-09-28 DIAGNOSIS — R2689 Other abnormalities of gait and mobility: Secondary | ICD-10-CM | POA: Diagnosis not present

## 2022-09-28 DIAGNOSIS — Z741 Need for assistance with personal care: Secondary | ICD-10-CM | POA: Diagnosis not present

## 2023-01-05 DIAGNOSIS — H40053 Ocular hypertension, bilateral: Secondary | ICD-10-CM | POA: Diagnosis not present

## 2023-01-05 DIAGNOSIS — Z961 Presence of intraocular lens: Secondary | ICD-10-CM | POA: Diagnosis not present

## 2023-01-05 DIAGNOSIS — Z0100 Encounter for examination of eyes and vision without abnormal findings: Secondary | ICD-10-CM | POA: Diagnosis not present

## 2023-01-14 DIAGNOSIS — I7091 Generalized atherosclerosis: Secondary | ICD-10-CM | POA: Diagnosis not present

## 2023-01-14 DIAGNOSIS — B351 Tinea unguium: Secondary | ICD-10-CM | POA: Diagnosis not present

## 2023-05-19 ENCOUNTER — Ambulatory Visit: Admitting: Family Medicine

## 2023-05-19 ENCOUNTER — Encounter: Payer: Self-pay | Admitting: Family Medicine

## 2023-05-19 VITALS — BP 133/60 | HR 80 | Ht 67.0 in | Wt 128.0 lb

## 2023-05-19 DIAGNOSIS — I872 Venous insufficiency (chronic) (peripheral): Secondary | ICD-10-CM

## 2023-05-19 DIAGNOSIS — I10 Essential (primary) hypertension: Secondary | ICD-10-CM | POA: Diagnosis not present

## 2023-05-19 DIAGNOSIS — R413 Other amnesia: Secondary | ICD-10-CM | POA: Diagnosis not present

## 2023-05-19 DIAGNOSIS — K5790 Diverticulosis of intestine, part unspecified, without perforation or abscess without bleeding: Secondary | ICD-10-CM

## 2023-05-19 DIAGNOSIS — Z5181 Encounter for therapeutic drug level monitoring: Secondary | ICD-10-CM

## 2023-05-19 DIAGNOSIS — R4189 Other symptoms and signs involving cognitive functions and awareness: Secondary | ICD-10-CM | POA: Diagnosis not present

## 2023-05-19 DIAGNOSIS — I6523 Occlusion and stenosis of bilateral carotid arteries: Secondary | ICD-10-CM

## 2023-05-19 DIAGNOSIS — E038 Other specified hypothyroidism: Secondary | ICD-10-CM

## 2023-05-19 NOTE — Progress Notes (Signed)
 Established patient visit   Patient: Courtney Willis   DOB: Jun 22, 1934   89 y.o. Female  MRN: [id number] Visit Date: 05/19/2023  Today's healthcare provider: Mimi Alt, MD   Chief Complaint  Patient presents with   Establish Care    No concerns    Subjective     HPI     Establish Care    Additional comments: No concerns       Last edited by Bart Lieu, CMA on 05/19/2023  4:14 PM.       Discussed the use of AI scribe software for clinical note transcription with the patient, who gave verbal consent to proceed.  History of Present Illness Courtney Willis is an 89 year old female who presents to transfer care from her previous primary care provider.  She has a history of a stroke in 2018, which resulted in significant cognitive decline. Initially, she was able to live independently, but by November 2023, it became apparent that she could no longer live alone. She moved from her own home to assisted living and then to a memory care unit due to a rapid decline in her condition.  She experienced a dramatic decline in her ability to walk and required assistance for basic activities. She was on a seizure medication that was not crushable, which led to a decision to discontinue it. Following the discontinuation, she showed significant improvement, regaining her ability to walk within two weeks.  Her current medications include losartan  50 mg daily for blood pressure, calcium and vitamin D  supplements, and lamotrigine  100 mg twice a day. She resides in a memory care unit at [facility] facility.  Her past medical history includes acid reflux, arthritis, vaginal atrophy, vitamin D  deficiency, bowel disease, a history of basal cell skin cancer on her upper arm and chest, chronic constipation, diverticular disease, hypercholesterolemia, and postmenopausal syndrome.     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   bisacodyl (DULCOLAX) 10 MG suppository Place 10 mg rectally. Every 24 hours as needed.   Calcium Carbonate-Vit D-Min (CALCIUM 1200 PO) Take by mouth daily.    cholecalciferol (VITAMIN D3) 25 MCG (1000 UT) tablet Take 1,000 Units by mouth daily.   clobetasol  ointment (TEMOVATE ) 0.05 % Apply 1 Application topically at bedtime.   diphenhydrAMINE  (BENADRYL  ALLERGY) 25 mg capsule Take 1 capsule (25 mg total) by mouth 2 (two) times daily as needed for itching.   lamoTRIgine  (LAMICTAL ) 100 MG tablet Take 1 tablet (100 mg total) by mouth 2 (two) times daily.   losartan  (COZAAR ) 50 MG tablet Take 1 tablet (50 mg total) by mouth daily.   OXYGEN 2lpm for dyspnea/SOB every 8 hours as needed.   timolol  (TIMOPTIC ) 0.5 % ophthalmic solution Place 1 drop into both eyes at  bedtime.   No facility-administered medications prior to visit.    Review of Systems  Last metabolic panel Lab Results  Component Value Date   GLUCOSE 94 05/19/2023   NA 138 05/19/2023   K 4.6 05/19/2023   CL 98 05/19/2023   CO2 25 05/19/2023   BUN 20 05/19/2023   CREATININE 0.77 05/19/2023   EGFR 75 05/19/2023   CALCIUM 10.0 05/19/2023   PROT 6.1 04/19/2022   ALBUMIN 4.2 04/19/2022   LABGLOB 1.9 04/19/2022   AGRATIO 2.2 04/19/2022   BILITOT 0.5 04/19/2022   ALKPHOS 125 (H) 04/19/2022   AST 17 04/19/2022   ALT 10 04/19/2022   ANIONGAP 9 11/13/2021    Last lipids Lab Results  Component Value Date   CHOL 192 09/24/2019   HDL 67 09/24/2019   LDLCALC 110 (H) 09/24/2019   TRIG 83 09/24/2019   CHOLHDL 2.8 09/20/2018   Last hemoglobin A1c Lab Results  Component Value Date   HGBA1C 5.6 12/01/2020   Last thyroid functions Lab Results  Component Value Date   TSH 6.300 (H) 05/19/2023   T4TOTAL 5.8 09/20/2018        Objective    BP 133/60   Pulse 80   Ht 5\' 7"  (1.702 m)   Wt 128 lb (58.1 kg)   SpO2 100%   BMI 20.05 kg/m  BP Readings from Last 3 Encounters:  05/19/23 133/60  05/21/22 127/72  04/24/22 121/73   Wt Readings from Last 3 Encounters:  05/19/23 128 lb (58.1 kg)  05/21/22 110 lb 3.2 oz (50 kg)  04/19/22 114 lb (51.7 kg)        Physical Exam  General: Alert, no acute distress, elderly female seated in exam room with walker beside her  Cardio: RRR Pulm: CTAB, normal work of breathing ABD: soft, abdomen is not distended, there is no tenderness to palpation, normal BS  Extremities: no LE edema    No results found for any visits on 05/19/23.  Assessment & Plan     Problem List Items Addressed This Visit   None   Assessment and Plan Assessment & Plan Cognitive Decline Significant cognitive decline followed a stroke in 2018, with worsening in November of the previous year necessitating a move to assisted living and then memory care. Discontinuation of a seizure medication led to improvement. - Monitor cognitive status - Review current medications for potential impact on cognitive function  Seizure Disorder Swallowing difficulties led to discontinuation of a non-crushable seizure medication. Currently on lamotrigine  100 mg twice daily, effectively managing the condition without adverse effects. - Continue lamotrigine  100 mg twice daily - Monitor for seizure activity  Hypertension Hypertension is well-controlled with losartan  50 mg daily. Regular monitoring of kidney function and potassium levels  is necessary due to losartan  use. - Continue losartan  50 mg daily - Monitor blood pressure regularly - Check kidney function and potassium levels  Transfer of Care Care is transferring from a previous primary care provider. Residing in a memory care unit at [facility], with preference for an external primary care provider. Focus is on managing existing conditions. - Establish care with new primary care provider - Perform lab work including BMP, CBC, lamotrigine  level, and thyroid function tests - Review and update medication list - Send refills for losartan  and other medications as needed  General Health Maintenance Family opts out of preventive screenings like bone scans and colonoscopies unless medically necessary, focusing on managing existing conditions. - Schedule annual wellness visit via phone  Follow-up Ongoing care from the new primary care provider will focus on managing existing conditions and monitoring lab results. - Schedule follow-up appointments based on lab results and medication management - Coordinate with [facility] facility for ongoing care needs - will fax updated lab work to patient's facility     No follow-ups on file.         Mimi Alt, MD  [facility] [phone] (phone) [phone] (fax)  [facility]

## 2023-05-20 ENCOUNTER — Encounter: Payer: Self-pay | Admitting: Family Medicine

## 2023-05-20 LAB — BMP8+EGFR
BUN/Creatinine Ratio: 26 (ref 12–28)
BUN: 20 mg/dL (ref 8–27)
CO2: 25 mmol/L (ref 20–29)
Calcium: 10 mg/dL (ref 8.7–10.3)
Chloride: 98 mmol/L (ref 96–106)
Creatinine, Ser: 0.77 mg/dL (ref 0.57–1.00)
Glucose: 94 mg/dL (ref 70–99)
Potassium: 4.6 mmol/L (ref 3.5–5.2)
Sodium: 138 mmol/L (ref 134–144)
eGFR: 75 mL/min/{1.73_m2} (ref >59)

## 2023-05-20 LAB — CBC
Hematocrit: 37.2 % (ref 34.0–46.6)
Hemoglobin: 12.4 g/dL (ref 11.1–15.9)
MCH: 30.2 pg (ref 26.6–33.0)
MCHC: 33.3 g/dL (ref 31.5–35.7)
MCV: 91 fL (ref 79–97)
Platelets: 355 10*3/uL (ref 150–450)
RBC: 4.1 x10E6/uL (ref 3.77–5.28)
RDW: 12.2 % (ref 11.7–15.4)
WBC: 6.5 10*3/uL (ref 3.4–10.8)

## 2023-05-20 LAB — TSH+T4F+T3FREE
Free T4: 0.96 ng/dL (ref 0.82–1.77)
T3, Free: 2.5 pg/mL (ref 2.0–4.4)
TSH: 6.3 u[IU]/mL — ABNORMAL HIGH (ref 0.450–4.500)

## 2023-05-20 LAB — LAMOTRIGINE LEVEL: Lamotrigine Lvl: 6 ug/mL (ref 2.0–20.0)

## 2023-08-15 ENCOUNTER — Non-Acute Institutional Stay: Payer: Self-pay | Admitting: Student

## 2023-08-15 ENCOUNTER — Encounter: Payer: Self-pay | Admitting: Student

## 2023-08-15 DIAGNOSIS — L03317 Cellulitis of buttock: Secondary | ICD-10-CM | POA: Diagnosis not present

## 2023-08-15 NOTE — Progress Notes (Unsigned)
 Location:  Other [facility].  Nursing Home Room Number: [id number] Place of Service:  ALF (13) Provider:  Dr. Richerd Brigham  PCP: Sharma Coyer, MD  Patient Care Team: Sharma Coyer, MD as PCP - General (Family Medicine) Darliss Rogue, MD as PCP - Cardiology (Cardiology) Lenn Standing, MD as Consulting Physician (Ophthalmology) Dasher, Alm LABOR, MD as Consulting Physician (Dermatology) Whitfield Raisin, NP as Nurse Practitioner (Neurology)  Extended Emergency Contact Information Primary Emergency Contact: Hladik,Matt  [place] Home Phone: [phone] Mobile Phone: [phone] Relation: Son Secondary Emergency Contact: Hundley,Janet Mobile Phone: [phone] Relation: Daughter  Code Status:  DNR Goals of care: Advanced Directive information    08/15/2023    9:18 AM  Advanced Directives  Does Patient Have a Medical Advance Directive? Yes  Type of Advance Directive Out of facility DNR (pink MOST or yellow form)  Does patient want to make changes to medical advance directive? No - Patient declined     Chief Complaint  Patient presents with   Skin Irritation    Skin Irritation.     HPI:  Pt is a 89 y.o. female seen today for an acute visit for Skin Irritation.  Patient has had irritation on her buttocks anywhere between 5 days and 2 weeks. Notified nursing of the skin issue on Thursday. 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   Post menopausal syndrome  08/12/2014   Stroke [facility]    Past Surgical History:  Procedure Laterality Date   BUNIONECTOMY     05/2004, 2007   CATARACT EXTRACTION     04/2005, 06/2005   COLONOSCOPY W/ POLYPECTOMY     COLONOSCOPY WITH PROPOFOL  N/A 02/15/2017   Procedure: COLONOSCOPY WITH PROPOFOL ;  Surgeon: Therisa Bi, MD;  Location: [facility] END[facility];  Service: Gastroenterology;  Laterality: N/A;   HYSTEROSCOPY  2011   ORIF WRIST FRACTURE Right 08/30/2015   Procedure: OPEN REDUCTION INTERNAL FIXATION (ORIF) RIGHT WRIST FRACTURE AND REPAIR AS NECESSARY;  Surgeon: Elsie Mussel, MD;  Location: [facility] OR;  Service: Orthopedics;  Laterality: Right;   UPPER GI ENDOSCOPY     WRIST FRACTURE SURGERY Left    06/2006    No Known Allergies  Outpatient Encounter Medications as of 08/15/2023  Medication Sig   bisacodyl (DULCOLAX) 10 MG suppository Place 10 mg rectally. Every 24 hours as needed.   Calcium Carbonate-Vit D-Min (CALCIUM 1200 PO) Take by mouth daily.    cetaphil (CETAPHIL) lotion Apply 1 Application topically daily.   cholecalciferol (VITAMIN D3) 25 MCG (1000 UT) tablet Take 1,000 Units by mouth daily.   diphenhydrAMINE  (BENADRYL  ALLERGY) 25 mg capsule Take 1 capsule (25 mg total) by mouth 2 (two) times daily as needed for itching. (Patient taking differently: Take 25 mg by mouth at bedtime as needed for itching.)   ketoconazole  (NIZORAL ) 2 % shampoo Apply 1 Application topically 2 (two) times a week.   lamoTRIgine  (LAMICTAL ) 100 MG tablet Take 1 tablet (100 mg total) by mouth 2 (two)  times daily.   losartan  (COZAAR ) 50 MG tablet Take 1 tablet (50 mg total) by mouth daily. (Patient taking differently: Take 25 mg by mouth daily.)   OXYGEN 2lpm for dyspnea/SOB every 8 hours as needed.   polyethylene glycol (MIRALAX  / GLYCOLAX ) 17 g packet Take 17 g by mouth daily. Give one scoop by mouth as needed   Soap & Cleansers (CETAPHIL GENTLE CLEANSER) LIQD Apply to body topically every day shift every Monday and Thursday for rash    timolol  (TIMOPTIC ) 0.5 % ophthalmic solution Place 1 drop into both eyes at bedtime.   clobetasol  ointment (TEMOVATE ) 0.05 % Apply 1 Application topically at bedtime. (Patient not taking: Reported on 08/15/2023)   No facility-administered encounter medications on file as of 08/15/2023.    Review of Systems  Immunization History  Administered Date(s) Administered   Fluad Quad(high Dose 65+) 09/20/2018, 11/19/2021   Influenza, High Dose Seasonal PF 11/24/2016   Influenza-Unspecified 10/13/2017, 12/17/2019, 11/13/2020   Pneumococcal Conjugate-13 07/02/2014   Pneumococcal Polysaccharide-23 07/01/2004, 11/05/2004   Td 01/14/2003, 04/08/2010   Tdap 04/08/2010, 08/26/2015   Zoster Recombinant(Shingrix) 10/13/2017, 02/21/2018, 02/21/2018   Zoster, Live 03/11/2005   Pertinent  Health Maintenance Due  Topic Date Due   Colonoscopy  02/16/2020   DEXA SCAN  05/18/2024 (Originally 11/21/2021)   INFLUENZA VACCINE  09/02/2023      05/29/2021    1:17 PM 11/13/2021    4:49 PM 11/19/2021    1:16 PM 02/04/2022    2:22 PM 04/08/2022    2:00 PM  Fall Risk  Falls in the past year? 0  0 0 1  Was there an injury with Fall? 0  0 0 1  Fall Risk Category Calculator 0  0 0 3  Fall Risk Category (Retired) Low   Low  Low    (RETIRED) Patient Fall Risk Level Low fall risk  High fall risk  Low fall risk  Low fall risk    Patient at Risk for Falls Due to   No Fall Risks No Fall Risks   Fall risk Follow up   Falls evaluation completed        Data saved with a previous flowsheet row definition   Functional Status Survey:    Vitals:   08/15/23 0904  BP: 107/64  Pulse: 83  Resp: 18  Temp: 98.4 F (36.9 C)  SpO2: 99%  Weight: 126 lb 12.8 oz (57.5 kg)  Height: 5' 7 (1.702 m)   Body mass index is 19.86 kg/m. Physical Exam Skin:    Comments: Pustules on right gluteal fold  Neurological:     Mental Status: She is alert.     Labs reviewed: Recent Labs    05/19/23 1641  NA 138  K 4.6  CL 98   CO2 25  GLUCOSE 94  BUN 20  CREATININE 0.77  CALCIUM 10.0   No results for input(s): AST, ALT, ALKPHOS, BILITOT, PROT, ALBUMIN in the last 8760 hours. Recent Labs    05/19/23 1641  WBC 6.5  HGB 12.4  HCT 37.2  MCV 91  PLT 355   Lab Results  Component Value Date   TSH 6.300 (H) 05/19/2023   Lab Results  Component Value Date   HGBA1C 5.6 12/01/2020   Lab Results  Component Value Date   CHOL 192 09/24/2019   HDL 67 09/24/2019   LDLCALC 110 (H) 09/24/2019   TRIG 83 09/24/2019   CHOLHDL 2.8 09/20/2018    Significant Diagnostic Results in last 30  days:  No results found.  Assessment/Plan Cellulitis of buttock Apply mupirocin gel BIDx7 days. Recommend continued f/u with PCP  Family/ staff Communication: nursing  Labs/tests ordered:  none

## 2023-08-16 ENCOUNTER — Encounter: Payer: Self-pay | Admitting: Student

## 2023-11-16 DIAGNOSIS — I7091 Generalized atherosclerosis: Secondary | ICD-10-CM | POA: Diagnosis not present

## 2023-11-16 DIAGNOSIS — B351 Tinea unguium: Secondary | ICD-10-CM | POA: Diagnosis not present
